# Patient Record
Sex: Female | Born: 1965
Health system: Southern US, Community
[De-identification: ages and names within clinical notes are randomized; demographics above are authoritative.]

## PROBLEM LIST (undated history)

## (undated) DIAGNOSIS — N63 Unspecified lump in unspecified breast: Secondary | ICD-10-CM

## (undated) DIAGNOSIS — I10 Essential (primary) hypertension: Secondary | ICD-10-CM

## (undated) HISTORY — PX: BREAST EXCISIONAL BIOPSY: SUR124

---

## 1998-05-21 ENCOUNTER — Encounter: Admission: RE | Admit: 1998-05-21 | Discharge: 1998-05-21 | Payer: Self-pay | Admitting: Family Medicine

## 1998-06-19 ENCOUNTER — Encounter: Admission: RE | Admit: 1998-06-19 | Discharge: 1998-06-19 | Payer: Self-pay | Admitting: Family Medicine

## 1998-09-02 ENCOUNTER — Encounter: Admission: RE | Admit: 1998-09-02 | Discharge: 1998-09-02 | Payer: Self-pay | Admitting: Family Medicine

## 1998-09-10 ENCOUNTER — Encounter: Admission: RE | Admit: 1998-09-10 | Discharge: 1998-09-10 | Payer: Self-pay | Admitting: Sports Medicine

## 1999-02-24 ENCOUNTER — Encounter: Admission: RE | Admit: 1999-02-24 | Discharge: 1999-02-24 | Payer: Self-pay | Admitting: Family Medicine

## 1999-03-31 ENCOUNTER — Encounter: Admission: RE | Admit: 1999-03-31 | Discharge: 1999-03-31 | Payer: Self-pay | Admitting: Family Medicine

## 1999-07-08 ENCOUNTER — Encounter: Admission: RE | Admit: 1999-07-08 | Discharge: 1999-07-08 | Payer: Self-pay | Admitting: Sports Medicine

## 1999-09-02 ENCOUNTER — Emergency Department (HOSPITAL_COMMUNITY): Admission: EM | Admit: 1999-09-02 | Discharge: 1999-09-02 | Payer: Self-pay | Admitting: Emergency Medicine

## 1999-09-02 ENCOUNTER — Encounter: Payer: Self-pay | Admitting: Emergency Medicine

## 1999-09-03 ENCOUNTER — Encounter: Admission: RE | Admit: 1999-09-03 | Discharge: 1999-09-03 | Payer: Self-pay | Admitting: Family Medicine

## 1999-11-12 ENCOUNTER — Encounter: Admission: RE | Admit: 1999-11-12 | Discharge: 1999-11-12 | Payer: Self-pay | Admitting: Family Medicine

## 1999-12-09 ENCOUNTER — Encounter: Admission: RE | Admit: 1999-12-09 | Discharge: 1999-12-09 | Payer: Self-pay | Admitting: Family Medicine

## 1999-12-23 ENCOUNTER — Encounter: Admission: RE | Admit: 1999-12-23 | Discharge: 1999-12-23 | Payer: Self-pay | Admitting: Sports Medicine

## 2000-01-22 ENCOUNTER — Encounter: Admission: RE | Admit: 2000-01-22 | Discharge: 2000-01-22 | Payer: Self-pay | Admitting: Family Medicine

## 2000-03-02 ENCOUNTER — Encounter: Admission: RE | Admit: 2000-03-02 | Discharge: 2000-03-02 | Payer: Self-pay | Admitting: Family Medicine

## 2000-03-02 ENCOUNTER — Other Ambulatory Visit: Admission: RE | Admit: 2000-03-02 | Discharge: 2000-03-02 | Payer: Self-pay | Admitting: Obstetrics & Gynecology

## 2000-03-15 ENCOUNTER — Encounter: Admission: RE | Admit: 2000-03-15 | Discharge: 2000-03-15 | Payer: Self-pay | Admitting: Family Medicine

## 2000-03-16 ENCOUNTER — Inpatient Hospital Stay (HOSPITAL_COMMUNITY): Admission: AD | Admit: 2000-03-16 | Discharge: 2000-03-16 | Payer: Self-pay | Admitting: Obstetrics

## 2000-03-30 ENCOUNTER — Encounter: Admission: RE | Admit: 2000-03-30 | Discharge: 2000-03-30 | Payer: Self-pay | Admitting: Sports Medicine

## 2000-04-02 ENCOUNTER — Encounter: Admission: RE | Admit: 2000-04-02 | Discharge: 2000-04-02 | Payer: Self-pay | Admitting: *Deleted

## 2000-04-19 ENCOUNTER — Encounter: Admission: RE | Admit: 2000-04-19 | Discharge: 2000-04-19 | Payer: Self-pay | Admitting: Family Medicine

## 2000-05-06 ENCOUNTER — Encounter (INDEPENDENT_AMBULATORY_CARE_PROVIDER_SITE_OTHER): Payer: Self-pay | Admitting: *Deleted

## 2000-05-06 ENCOUNTER — Ambulatory Visit (HOSPITAL_COMMUNITY): Admission: RE | Admit: 2000-05-06 | Discharge: 2000-05-06 | Payer: Self-pay | Admitting: Surgery

## 2000-06-15 ENCOUNTER — Encounter: Admission: RE | Admit: 2000-06-15 | Discharge: 2000-06-15 | Payer: Self-pay | Admitting: Family Medicine

## 2000-06-23 ENCOUNTER — Encounter: Admission: RE | Admit: 2000-06-23 | Discharge: 2000-06-23 | Payer: Self-pay | Admitting: Family Medicine

## 2000-11-17 ENCOUNTER — Encounter: Admission: RE | Admit: 2000-11-17 | Discharge: 2000-11-17 | Payer: Self-pay | Admitting: Family Medicine

## 2000-12-07 ENCOUNTER — Encounter: Admission: RE | Admit: 2000-12-07 | Discharge: 2000-12-07 | Payer: Self-pay | Admitting: Sports Medicine

## 2000-12-21 ENCOUNTER — Encounter: Admission: RE | Admit: 2000-12-21 | Discharge: 2000-12-21 | Payer: Self-pay | Admitting: Family Medicine

## 2001-07-06 ENCOUNTER — Encounter: Admission: RE | Admit: 2001-07-06 | Discharge: 2001-07-06 | Payer: Self-pay | Admitting: Family Medicine

## 2001-07-26 ENCOUNTER — Encounter: Admission: RE | Admit: 2001-07-26 | Discharge: 2001-07-26 | Payer: Self-pay | Admitting: Family Medicine

## 2001-08-08 ENCOUNTER — Encounter: Admission: RE | Admit: 2001-08-08 | Discharge: 2001-08-08 | Payer: Self-pay | Admitting: Family Medicine

## 2001-08-29 ENCOUNTER — Encounter: Admission: RE | Admit: 2001-08-29 | Discharge: 2001-08-29 | Payer: Self-pay | Admitting: Family Medicine

## 2001-09-09 ENCOUNTER — Encounter: Admission: RE | Admit: 2001-09-09 | Discharge: 2001-09-09 | Payer: Self-pay | Admitting: Family Medicine

## 2001-10-25 ENCOUNTER — Encounter: Admission: RE | Admit: 2001-10-25 | Discharge: 2001-10-25 | Payer: Self-pay | Admitting: Family Medicine

## 2001-10-31 ENCOUNTER — Other Ambulatory Visit: Admission: RE | Admit: 2001-10-31 | Discharge: 2001-10-31 | Payer: Self-pay | Admitting: Sports Medicine

## 2001-10-31 ENCOUNTER — Encounter: Admission: RE | Admit: 2001-10-31 | Discharge: 2001-10-31 | Payer: Self-pay | Admitting: Sports Medicine

## 2002-02-15 ENCOUNTER — Encounter: Admission: RE | Admit: 2002-02-15 | Discharge: 2002-02-15 | Payer: Self-pay | Admitting: Family Medicine

## 2002-03-08 ENCOUNTER — Encounter: Admission: RE | Admit: 2002-03-08 | Discharge: 2002-03-08 | Payer: Self-pay | Admitting: Family Medicine

## 2002-03-21 ENCOUNTER — Encounter: Admission: RE | Admit: 2002-03-21 | Discharge: 2002-03-21 | Payer: Self-pay | Admitting: Family Medicine

## 2002-03-21 ENCOUNTER — Ambulatory Visit (HOSPITAL_COMMUNITY): Admission: RE | Admit: 2002-03-21 | Discharge: 2002-03-21 | Payer: Self-pay | Admitting: Family Medicine

## 2002-03-24 ENCOUNTER — Encounter: Admission: RE | Admit: 2002-03-24 | Discharge: 2002-03-24 | Payer: Self-pay | Admitting: Family Medicine

## 2002-05-11 ENCOUNTER — Encounter: Admission: RE | Admit: 2002-05-11 | Discharge: 2002-05-11 | Payer: Self-pay | Admitting: Family Medicine

## 2002-08-11 ENCOUNTER — Encounter: Admission: RE | Admit: 2002-08-11 | Discharge: 2002-08-11 | Payer: Self-pay | Admitting: Family Medicine

## 2002-08-17 ENCOUNTER — Encounter: Payer: Self-pay | Admitting: Sports Medicine

## 2002-08-17 ENCOUNTER — Encounter: Admission: RE | Admit: 2002-08-17 | Discharge: 2002-08-17 | Payer: Self-pay | Admitting: Sports Medicine

## 2002-09-26 ENCOUNTER — Encounter: Admission: RE | Admit: 2002-09-26 | Discharge: 2002-09-26 | Payer: Self-pay | Admitting: Family Medicine

## 2002-09-28 ENCOUNTER — Encounter: Admission: RE | Admit: 2002-09-28 | Discharge: 2002-09-28 | Payer: Self-pay | Admitting: Family Medicine

## 2002-11-08 ENCOUNTER — Encounter: Admission: RE | Admit: 2002-11-08 | Discharge: 2002-11-08 | Payer: Self-pay | Admitting: Family Medicine

## 2002-12-06 ENCOUNTER — Encounter: Admission: RE | Admit: 2002-12-06 | Discharge: 2002-12-06 | Payer: Self-pay | Admitting: Family Medicine

## 2002-12-13 ENCOUNTER — Other Ambulatory Visit: Admission: RE | Admit: 2002-12-13 | Discharge: 2002-12-13 | Payer: Self-pay | Admitting: Family Medicine

## 2002-12-13 ENCOUNTER — Encounter: Admission: RE | Admit: 2002-12-13 | Discharge: 2002-12-13 | Payer: Self-pay | Admitting: Family Medicine

## 2003-01-12 ENCOUNTER — Encounter: Admission: RE | Admit: 2003-01-12 | Discharge: 2003-01-12 | Payer: Self-pay | Admitting: Family Medicine

## 2003-02-20 ENCOUNTER — Encounter: Payer: Self-pay | Admitting: Sports Medicine

## 2003-02-20 ENCOUNTER — Encounter: Admission: RE | Admit: 2003-02-20 | Discharge: 2003-02-20 | Payer: Self-pay | Admitting: Sports Medicine

## 2003-02-28 ENCOUNTER — Emergency Department (HOSPITAL_COMMUNITY): Admission: AD | Admit: 2003-02-28 | Discharge: 2003-02-28 | Payer: Self-pay | Admitting: Emergency Medicine

## 2003-02-28 ENCOUNTER — Encounter: Payer: Self-pay | Admitting: Emergency Medicine

## 2003-04-27 ENCOUNTER — Encounter: Admission: RE | Admit: 2003-04-27 | Discharge: 2003-04-27 | Payer: Self-pay | Admitting: Sports Medicine

## 2003-06-12 ENCOUNTER — Emergency Department (HOSPITAL_COMMUNITY): Admission: EM | Admit: 2003-06-12 | Discharge: 2003-06-13 | Payer: Self-pay | Admitting: Emergency Medicine

## 2004-02-01 ENCOUNTER — Encounter: Admission: RE | Admit: 2004-02-01 | Discharge: 2004-02-01 | Payer: Self-pay | Admitting: Sports Medicine

## 2004-02-03 ENCOUNTER — Emergency Department (HOSPITAL_COMMUNITY): Admission: EM | Admit: 2004-02-03 | Discharge: 2004-02-03 | Payer: Self-pay | Admitting: Emergency Medicine

## 2004-06-25 ENCOUNTER — Ambulatory Visit: Payer: Self-pay | Admitting: Family Medicine

## 2004-12-19 ENCOUNTER — Other Ambulatory Visit: Admission: RE | Admit: 2004-12-19 | Discharge: 2004-12-19 | Payer: Self-pay | Admitting: Family Medicine

## 2004-12-19 ENCOUNTER — Ambulatory Visit: Payer: Self-pay | Admitting: Family Medicine

## 2005-02-14 ENCOUNTER — Emergency Department (HOSPITAL_COMMUNITY): Admission: EM | Admit: 2005-02-14 | Discharge: 2005-02-14 | Payer: Self-pay | Admitting: Emergency Medicine

## 2005-04-30 ENCOUNTER — Ambulatory Visit: Payer: Self-pay | Admitting: Family Medicine

## 2005-05-29 ENCOUNTER — Ambulatory Visit: Payer: Self-pay | Admitting: Family Medicine

## 2005-09-30 ENCOUNTER — Ambulatory Visit: Payer: Self-pay | Admitting: Family Medicine

## 2005-12-17 ENCOUNTER — Encounter (INDEPENDENT_AMBULATORY_CARE_PROVIDER_SITE_OTHER): Payer: Self-pay | Admitting: *Deleted

## 2005-12-17 LAB — CONVERTED CEMR LAB

## 2006-01-14 ENCOUNTER — Other Ambulatory Visit: Admission: RE | Admit: 2006-01-14 | Discharge: 2006-01-14 | Payer: Self-pay | Admitting: Family Medicine

## 2006-01-14 ENCOUNTER — Ambulatory Visit: Payer: Self-pay | Admitting: Family Medicine

## 2006-01-15 ENCOUNTER — Encounter: Admission: RE | Admit: 2006-01-15 | Discharge: 2006-01-15 | Payer: Self-pay | Admitting: Sports Medicine

## 2006-03-19 ENCOUNTER — Ambulatory Visit: Payer: Self-pay | Admitting: Sports Medicine

## 2006-10-04 ENCOUNTER — Ambulatory Visit: Payer: Self-pay | Admitting: Family Medicine

## 2006-12-06 ENCOUNTER — Ambulatory Visit: Payer: Self-pay | Admitting: Family Medicine

## 2006-12-06 ENCOUNTER — Encounter: Payer: Self-pay | Admitting: Family Medicine

## 2006-12-06 LAB — CONVERTED CEMR LAB
Chlamydia, Swab/Urine, PCR: NEGATIVE
GC Probe Amp, Urine: NEGATIVE

## 2006-12-09 ENCOUNTER — Encounter: Payer: Self-pay | Admitting: Family Medicine

## 2006-12-09 ENCOUNTER — Ambulatory Visit: Payer: Self-pay | Admitting: Family Medicine

## 2006-12-16 DIAGNOSIS — F1010 Alcohol abuse, uncomplicated: Secondary | ICD-10-CM | POA: Insufficient documentation

## 2006-12-16 DIAGNOSIS — F329 Major depressive disorder, single episode, unspecified: Secondary | ICD-10-CM | POA: Insufficient documentation

## 2006-12-17 ENCOUNTER — Encounter (INDEPENDENT_AMBULATORY_CARE_PROVIDER_SITE_OTHER): Payer: Self-pay | Admitting: *Deleted

## 2006-12-27 ENCOUNTER — Telehealth (INDEPENDENT_AMBULATORY_CARE_PROVIDER_SITE_OTHER): Payer: Self-pay | Admitting: *Deleted

## 2006-12-29 ENCOUNTER — Encounter: Payer: Self-pay | Admitting: Family Medicine

## 2006-12-29 ENCOUNTER — Ambulatory Visit: Payer: Self-pay | Admitting: Sports Medicine

## 2006-12-29 ENCOUNTER — Encounter: Admission: RE | Admit: 2006-12-29 | Discharge: 2006-12-29 | Payer: Self-pay | Admitting: Sports Medicine

## 2007-03-15 ENCOUNTER — Ambulatory Visit: Payer: Self-pay | Admitting: Sports Medicine

## 2007-03-15 ENCOUNTER — Telehealth: Payer: Self-pay | Admitting: *Deleted

## 2007-03-15 LAB — CONVERTED CEMR LAB
Glucose, Urine, Semiquant: NEGATIVE
Protein, U semiquant: 30
Specific Gravity, Urine: 1.02
pH: 7

## 2007-03-31 ENCOUNTER — Encounter: Payer: Self-pay | Admitting: Family Medicine

## 2007-03-31 ENCOUNTER — Ambulatory Visit: Payer: Self-pay | Admitting: Sports Medicine

## 2007-03-31 LAB — CONVERTED CEMR LAB
HDL: 61 mg/dL (ref >39)
LDL Cholesterol: 137 mg/dL — ABNORMAL HIGH (ref 0–99)
Triglycerides: 92 mg/dL (ref <150)

## 2007-04-07 ENCOUNTER — Telehealth: Payer: Self-pay | Admitting: *Deleted

## 2007-05-02 ENCOUNTER — Inpatient Hospital Stay (HOSPITAL_COMMUNITY): Admission: AD | Admit: 2007-05-02 | Discharge: 2007-05-04 | Payer: Self-pay | Admitting: Family Medicine

## 2007-05-02 ENCOUNTER — Ambulatory Visit: Payer: Self-pay | Admitting: Family Medicine

## 2007-05-11 ENCOUNTER — Encounter: Payer: Self-pay | Admitting: Family Medicine

## 2007-07-18 ENCOUNTER — Ambulatory Visit: Payer: Self-pay | Admitting: Sports Medicine

## 2007-07-18 ENCOUNTER — Telehealth (INDEPENDENT_AMBULATORY_CARE_PROVIDER_SITE_OTHER): Payer: Self-pay | Admitting: Family Medicine

## 2007-07-18 ENCOUNTER — Telehealth (INDEPENDENT_AMBULATORY_CARE_PROVIDER_SITE_OTHER): Payer: Self-pay | Admitting: *Deleted

## 2007-07-18 LAB — CONVERTED CEMR LAB
Nitrite: NEGATIVE
Specific Gravity, Urine: 1.015
Urobilinogen, UA: 2

## 2007-08-26 ENCOUNTER — Ambulatory Visit: Payer: Self-pay | Admitting: Family Medicine

## 2007-09-12 ENCOUNTER — Encounter (INDEPENDENT_AMBULATORY_CARE_PROVIDER_SITE_OTHER): Payer: Self-pay | Admitting: Family Medicine

## 2007-09-12 ENCOUNTER — Ambulatory Visit: Payer: Self-pay | Admitting: Sports Medicine

## 2007-09-12 LAB — CONVERTED CEMR LAB
ALT: 9 units/L (ref 0–35)
AST: 14 units/L (ref 0–37)
BUN: 14 mg/dL (ref 6–23)
Beta hcg, urine, semiquantitative: NEGATIVE
Bilirubin Urine: NEGATIVE
Creatinine, Ser: 0.7 mg/dL (ref 0.40–1.20)
Glucose, Urine, Semiquant: NEGATIVE
HCT: 40 % (ref 36.0–46.0)
Hemoglobin: 12.9 g/dL (ref 12.0–15.0)
Ketones, urine, test strip: NEGATIVE
MCHC: 32.3 g/dL (ref 30.0–36.0)
MCV: 89.1 fL (ref 78.0–100.0)
Platelets: 299 10*3/uL (ref 150–400)
RDW: 12.5 % (ref 11.5–15.5)
Specific Gravity, Urine: 1.02
Total Bilirubin: 1.4 mg/dL — ABNORMAL HIGH (ref 0.3–1.2)
Urobilinogen, UA: 0.2
pH: 7

## 2007-09-13 ENCOUNTER — Encounter (INDEPENDENT_AMBULATORY_CARE_PROVIDER_SITE_OTHER): Payer: Self-pay | Admitting: Family Medicine

## 2007-10-17 ENCOUNTER — Encounter: Payer: Self-pay | Admitting: *Deleted

## 2008-01-06 ENCOUNTER — Ambulatory Visit: Payer: Self-pay | Admitting: Family Medicine

## 2008-01-06 ENCOUNTER — Encounter: Payer: Self-pay | Admitting: Family Medicine

## 2008-01-06 LAB — CONVERTED CEMR LAB
CO2: 25 meq/L (ref 19–32)
Calcium: 8.7 mg/dL (ref 8.4–10.5)
Creatinine, Ser: 0.68 mg/dL (ref 0.40–1.20)
Eosinophils Absolute: 0.6 10*3/uL (ref 0.0–0.7)
Eosinophils Relative: 11 % — ABNORMAL HIGH (ref 0–5)
Glucose, Bld: 90 mg/dL (ref 70–99)
HCT: 40.3 % (ref 36.0–46.0)
Hemoglobin: 12.8 g/dL (ref 12.0–15.0)
Lymphocytes Relative: 46 % (ref 12–46)
Lymphs Abs: 2.4 10*3/uL (ref 0.7–4.0)
MCV: 90.8 fL (ref 78.0–100.0)
Monocytes Absolute: 0.3 10*3/uL (ref 0.1–1.0)
Platelets: 273 10*3/uL (ref 150–400)
RDW: 12.9 % (ref 11.5–15.5)
Sodium: 139 meq/L (ref 135–145)
WBC: 5.1 10*3/uL (ref 4.0–10.5)

## 2008-04-09 ENCOUNTER — Ambulatory Visit: Payer: Self-pay | Admitting: Sports Medicine

## 2008-04-09 ENCOUNTER — Encounter (INDEPENDENT_AMBULATORY_CARE_PROVIDER_SITE_OTHER): Payer: Self-pay | Admitting: *Deleted

## 2008-04-17 ENCOUNTER — Telehealth (INDEPENDENT_AMBULATORY_CARE_PROVIDER_SITE_OTHER): Payer: Self-pay | Admitting: *Deleted

## 2008-05-07 ENCOUNTER — Ambulatory Visit: Payer: Self-pay | Admitting: Family Medicine

## 2008-05-07 DIAGNOSIS — K219 Gastro-esophageal reflux disease without esophagitis: Secondary | ICD-10-CM | POA: Insufficient documentation

## 2008-05-24 ENCOUNTER — Ambulatory Visit: Payer: Self-pay | Admitting: Family Medicine

## 2008-05-24 ENCOUNTER — Other Ambulatory Visit: Admission: RE | Admit: 2008-05-24 | Discharge: 2008-05-24 | Payer: Self-pay | Admitting: Family Medicine

## 2008-05-24 ENCOUNTER — Encounter: Payer: Self-pay | Admitting: Family Medicine

## 2008-05-24 LAB — CONVERTED CEMR LAB: Pap Smear: NORMAL

## 2008-05-28 LAB — CONVERTED CEMR LAB
AST: 13 units/L (ref 0–37)
Alkaline Phosphatase: 39 units/L (ref 39–117)
BUN: 12 mg/dL (ref 6–23)
Chlamydia, DNA Probe: NEGATIVE
Creatinine, Ser: 0.82 mg/dL (ref 0.40–1.20)
Glucose, Bld: 83 mg/dL (ref 70–99)
HDL: 54 mg/dL (ref >39)
LDL Cholesterol: 165 mg/dL — ABNORMAL HIGH (ref 0–99)
Potassium: 3.9 meq/L (ref 3.5–5.3)
Total Bilirubin: 1.1 mg/dL (ref 0.3–1.2)
Total CHOL/HDL Ratio: 4.4
Triglycerides: 99 mg/dL (ref <150)
VLDL: 20 mg/dL (ref 0–40)

## 2008-05-29 ENCOUNTER — Telehealth: Payer: Self-pay | Admitting: Family Medicine

## 2008-06-01 ENCOUNTER — Telehealth: Payer: Self-pay | Admitting: *Deleted

## 2008-06-22 ENCOUNTER — Encounter: Payer: Self-pay | Admitting: *Deleted

## 2008-09-12 ENCOUNTER — Ambulatory Visit: Payer: Self-pay | Admitting: Family Medicine

## 2008-09-12 LAB — CONVERTED CEMR LAB: Whiff Test: POSITIVE

## 2008-10-02 LAB — CONVERTED CEMR LAB: GC Probe Amp, Genital: NEGATIVE

## 2008-10-03 ENCOUNTER — Telehealth: Payer: Self-pay | Admitting: *Deleted

## 2008-10-04 ENCOUNTER — Ambulatory Visit: Payer: Self-pay | Admitting: Family Medicine

## 2008-11-01 ENCOUNTER — Telehealth: Payer: Self-pay | Admitting: Family Medicine

## 2008-11-01 ENCOUNTER — Ambulatory Visit: Payer: Self-pay | Admitting: Family Medicine

## 2008-11-01 LAB — CONVERTED CEMR LAB
Bilirubin Urine: NEGATIVE
Ketones, urine, test strip: NEGATIVE
Protein, U semiquant: 100
Specific Gravity, Urine: 1.025
Whiff Test: POSITIVE
pH: 6.5

## 2008-11-02 ENCOUNTER — Telehealth: Payer: Self-pay | Admitting: Family Medicine

## 2008-11-13 ENCOUNTER — Telehealth (INDEPENDENT_AMBULATORY_CARE_PROVIDER_SITE_OTHER): Payer: Self-pay | Admitting: *Deleted

## 2008-11-27 ENCOUNTER — Ambulatory Visit: Payer: Self-pay | Admitting: Family Medicine

## 2008-11-27 LAB — CONVERTED CEMR LAB
Bilirubin Urine: NEGATIVE
Glucose, Urine, Semiquant: NEGATIVE
Nitrite: NEGATIVE
RBC / HPF: NEGATIVE
Specific Gravity, Urine: 1.025
Urobilinogen, UA: 0.2

## 2009-02-07 ENCOUNTER — Telehealth: Payer: Self-pay | Admitting: Family Medicine

## 2009-02-13 ENCOUNTER — Telehealth: Payer: Self-pay | Admitting: *Deleted

## 2009-02-14 ENCOUNTER — Ambulatory Visit: Payer: Self-pay | Admitting: Family Medicine

## 2009-02-14 ENCOUNTER — Encounter (INDEPENDENT_AMBULATORY_CARE_PROVIDER_SITE_OTHER): Payer: Self-pay | Admitting: Family Medicine

## 2009-02-14 LAB — CONVERTED CEMR LAB: Whiff Test: POSITIVE

## 2009-02-15 LAB — CONVERTED CEMR LAB: Chlamydia, DNA Probe: NEGATIVE

## 2009-03-05 ENCOUNTER — Telehealth: Payer: Self-pay | Admitting: Family Medicine

## 2009-03-12 ENCOUNTER — Ambulatory Visit: Payer: Self-pay | Admitting: Family Medicine

## 2009-03-14 ENCOUNTER — Ambulatory Visit: Payer: Self-pay | Admitting: Family Medicine

## 2009-03-25 ENCOUNTER — Telehealth: Payer: Self-pay | Admitting: Family Medicine

## 2009-03-25 ENCOUNTER — Ambulatory Visit: Payer: Self-pay | Admitting: Family Medicine

## 2009-05-21 ENCOUNTER — Ambulatory Visit: Payer: Self-pay | Admitting: Family Medicine

## 2009-05-21 DIAGNOSIS — I10 Essential (primary) hypertension: Secondary | ICD-10-CM | POA: Insufficient documentation

## 2009-05-21 DIAGNOSIS — E7800 Pure hypercholesterolemia, unspecified: Secondary | ICD-10-CM | POA: Insufficient documentation

## 2009-05-28 ENCOUNTER — Encounter: Payer: Self-pay | Admitting: Family Medicine

## 2009-05-28 ENCOUNTER — Ambulatory Visit: Payer: Self-pay | Admitting: Family Medicine

## 2009-05-28 ENCOUNTER — Other Ambulatory Visit: Admission: RE | Admit: 2009-05-28 | Discharge: 2009-05-28 | Payer: Self-pay | Admitting: Obstetrics and Gynecology

## 2009-05-28 LAB — CONVERTED CEMR LAB
Albumin: 4 g/dL (ref 3.5–5.2)
BUN: 12 mg/dL (ref 6–23)
CO2: 21 meq/L (ref 19–32)
Calcium: 8.4 mg/dL (ref 8.4–10.5)
Chloride: 107 meq/L (ref 96–112)
Cholesterol: 213 mg/dL — ABNORMAL HIGH (ref 0–200)
Glucose, Bld: 88 mg/dL (ref 70–99)
HDL: 53 mg/dL (ref >39)
Potassium: 4.1 meq/L (ref 3.5–5.3)
Protein, U semiquant: NEGATIVE
Triglycerides: 85 mg/dL (ref <150)
Urobilinogen, UA: 1
WBC Urine, dipstick: NEGATIVE

## 2009-05-29 ENCOUNTER — Encounter: Payer: Self-pay | Admitting: Family Medicine

## 2009-06-07 ENCOUNTER — Telehealth (INDEPENDENT_AMBULATORY_CARE_PROVIDER_SITE_OTHER): Payer: Self-pay | Admitting: *Deleted

## 2009-06-12 ENCOUNTER — Encounter (INDEPENDENT_AMBULATORY_CARE_PROVIDER_SITE_OTHER): Payer: Self-pay | Admitting: *Deleted

## 2009-07-17 ENCOUNTER — Encounter: Payer: Self-pay | Admitting: Family Medicine

## 2009-07-17 ENCOUNTER — Ambulatory Visit: Payer: Self-pay | Admitting: Family Medicine

## 2009-07-17 LAB — CONVERTED CEMR LAB: Chlamydia, DNA Probe: NEGATIVE

## 2009-07-18 ENCOUNTER — Encounter: Payer: Self-pay | Admitting: Family Medicine

## 2009-08-30 ENCOUNTER — Ambulatory Visit: Payer: Self-pay | Admitting: Family Medicine

## 2009-08-30 LAB — CONVERTED CEMR LAB
Blood in Urine, dipstick: NEGATIVE
Specific Gravity, Urine: 1.025
Urobilinogen, UA: 1
pH: 7

## 2009-08-31 ENCOUNTER — Encounter: Payer: Self-pay | Admitting: Family Medicine

## 2009-09-19 ENCOUNTER — Telehealth: Payer: Self-pay | Admitting: Family Medicine

## 2009-10-20 ENCOUNTER — Emergency Department (HOSPITAL_COMMUNITY): Admission: EM | Admit: 2009-10-20 | Discharge: 2009-10-21 | Payer: Self-pay | Admitting: Emergency Medicine

## 2009-10-28 ENCOUNTER — Telehealth: Payer: Self-pay | Admitting: Family Medicine

## 2010-01-16 ENCOUNTER — Encounter: Payer: Self-pay | Admitting: Family Medicine

## 2010-01-17 ENCOUNTER — Ambulatory Visit: Payer: Self-pay | Admitting: Family Medicine

## 2010-03-18 ENCOUNTER — Ambulatory Visit: Payer: Self-pay | Admitting: Family Medicine

## 2010-03-18 ENCOUNTER — Telehealth: Payer: Self-pay | Admitting: Family Medicine

## 2010-05-19 ENCOUNTER — Ambulatory Visit: Payer: Self-pay | Admitting: Family Medicine

## 2010-05-19 ENCOUNTER — Encounter: Payer: Self-pay | Admitting: Family Medicine

## 2010-05-19 LAB — CONVERTED CEMR LAB
Chlamydia, DNA Probe: NEGATIVE
Ketones, urine, test strip: NEGATIVE
Nitrite: NEGATIVE
Urobilinogen, UA: 1
Whiff Test: POSITIVE

## 2010-07-25 ENCOUNTER — Encounter: Payer: Self-pay | Admitting: Family Medicine

## 2010-08-01 ENCOUNTER — Emergency Department (HOSPITAL_COMMUNITY)
Admission: EM | Admit: 2010-08-01 | Discharge: 2010-08-01 | Payer: Self-pay | Source: Home / Self Care | Admitting: Emergency Medicine

## 2010-08-21 ENCOUNTER — Encounter: Payer: Self-pay | Admitting: Family Medicine

## 2010-08-22 ENCOUNTER — Encounter: Payer: Self-pay | Admitting: Family Medicine

## 2010-08-22 ENCOUNTER — Ambulatory Visit: Payer: Self-pay | Admitting: Family Medicine

## 2010-09-22 ENCOUNTER — Encounter: Payer: Self-pay | Admitting: Family Medicine

## 2010-09-22 ENCOUNTER — Ambulatory Visit: Payer: Self-pay | Admitting: Family Medicine

## 2010-09-22 ENCOUNTER — Encounter: Payer: Self-pay | Admitting: *Deleted

## 2010-09-22 DIAGNOSIS — K5900 Constipation, unspecified: Secondary | ICD-10-CM | POA: Insufficient documentation

## 2010-09-22 DIAGNOSIS — B9689 Other specified bacterial agents as the cause of diseases classified elsewhere: Secondary | ICD-10-CM

## 2010-09-22 DIAGNOSIS — N760 Acute vaginitis: Secondary | ICD-10-CM | POA: Insufficient documentation

## 2010-09-22 LAB — CONVERTED CEMR LAB
Chlamydia, DNA Probe: NEGATIVE
GC Probe Amp, Genital: NEGATIVE
Whiff Test: POSITIVE

## 2010-11-09 ENCOUNTER — Encounter: Payer: Self-pay | Admitting: Family Medicine

## 2010-11-14 ENCOUNTER — Ambulatory Visit: Admit: 2010-11-14 | Payer: Self-pay

## 2010-11-20 NOTE — Progress Notes (Signed)
Summary: meds  Phone Note Call from Patient Call back at Home Phone [phone]   Caller: Patient Summary of Call: needs something called in for acid reflux - [facility] Initial call taken by: De Nurse,  October 28, 2009 2:38 PM  Follow-up for Phone Call        she has changed pharmacies. sent refill.  to pcp to drop [facility] from the [facility]. they may not fill it as it says [facility] Follow-up by: Golden Circle RN,  October 28, 2009 2:39 PM    New/Updated Medications: OMEPRAZOLE 20 MG CPDR (OMEPRAZOLE) 1 tab by mouth daily Prescriptions: OMEPRAZOLE 20 MG CPDR (OMEPRAZOLE) 1 tab by mouth daily  #30 x 11   Entered and Authorized by:   Ardeen Garland  MD   Signed by:   Ardeen Garland  MD on 10/28/2009   Method used:   Electronically to        [facility] [address] [phone]* (retail)       [address]       [address]       [place], [place]  [address]       Ph: [phone]       Fax: [phone]   RxID:   [id number] [facility] OME[facility] 20 MG TBEC (OMEPRAZOLE) 1 tab by mouth daily  #30 x 5   Entered by:   Golden Circle RN   Authorized by:   Ardeen Garland  MD   Signed by:   Golden Circle RN on 10/28/2009   Method used:   Electronically to        [facility] [address] [phone]* (retail)       [address]       [address]       [place], [place]  [address]       Ph: [phone]       Fax: [phone]   RxID:   [id number]
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
## 2010-11-20 NOTE — Assessment & Plan Note (Signed)
Summary: std?,df   Vital Signs:  Patient profile:   45 year old female Height:      62.75 inches Weight:      143 pounds BMI:     25.63 Temp:     98.3 degrees F oral Pulse rate:   74 / minute BP sitting:   126 / 74  (right arm) Cuff size:   regular  Vitals Entered By: Jimmy Footman, CMA (May 19, 2010 3:34 PM) CC: concerned about std. no sx... condom fell off during intercourse Is Patient Diabetic? No Pain Assessment Patient in pain? no        Primary Provider:  Demetria Pore MD  CC:  concerned about std. no sx... condom fell off during intercourse.  History of Present Illness: Pt comes in with a chief complaint of wanting to be checked for STIs, "just in case." Pt reports having intercourse with a regular partner 4-7 days ago when the condom "fell off."  The pt does not believe that her partner has any STIs but just wants to be sure. She says that she has not had any vaginal discharge, bleeding, or foul odor. She does not currently have dysuria, however, she did have dysuria a few  days ago and took 2 "old pills" and it has since resolved. She does not know what these pills were. No feelings of incomplete emptying.  The pt is also interested in starting some form of birth control. Initially she wanted depo, but decided against it once being counseled on the possibility of weight gain; pt decided she would prefer OCPs. Discussed BTL, pt denied. Pt has previously had implanon and does not want that. Pt will consider IUD and will call for future appt regarding this.   Current Medications (verified): 1)  E-Z Spacer  Devi (Spacer/aero-Holding Chambers) .... Inhale 1 Puff As Directed Every Four Hours 2)  Proventil Hfa 108 (90 Base) Mcg/act Aers (Albuterol Sulfate) .... Inhale 2 Puff Using Inhaler Every Four Hours 3)  Omeprazole 20 Mg Cpdr (Omeprazole) .Marland Kitchen.. 1 Tab By Mouth Daily 4)  Hydrochlorothiazide 12.5 Mg Caps (Hydrochlorothiazide) .Marland Kitchen.. 1 Tab By Mouth Daily 5)  Loratadine 10 Mg  Tabs (Loratadine) .Marland Kitchen.. 1 Tab By Mouth Daily For Allergies 6)  Fluconazole 150 Mg Tabs (Fluconazole) .... Take 1 Pill By Mouth One Time  Allergies (verified): 1)  ! Codeine 2)  Sulfamethoxazole (Sulfamethoxazole) 3)  Metronidazole (Metronidazole)  Physical Exam  General:  Well-developed,well-nourished,in no acute distress; alert,appropriate and cooperative throughout examination Lungs:  Normal respiratory effort, chest expands symmetrically. Lungs are clear to auscultation, no crackles or wheezes. Heart:  Normal rate and regular rhythm. S1 and S2 normal without gallop, murmur, click, rub or other extra sounds. Abdomen:  soft, normal bowel sounds, no distention, and no masses.  diffuse, moderate, TTP LLQ, RLQ, suprapubic areas. Genitalia:  no external lesions, no vaginal discharge, mucosa pink and moist, no vaginal or cervical lesions, no friaility or hemorrhage, and normal uterus size and position.   speculum exam- no discharge or bleeding noted. cervix normal in appearance.   Impression & Recommendations:  Problem # 1:  VAGINITIS, CANDIDAL (ICD-112.1)  Mod amt of yeast seen on wet prep. Will treat empirically.  Her updated medication list for this problem includes:    Fluconazole 150 Mg Tabs (Fluconazole) .Marland Kitchen... Take 1 pill by mouth one time  Orders: [facility]- Est Level  3 ([address]109)  Problem # 2:  ABDOMINAL PAIN, SUPRAPUBIC (ICD-789.09)  UA negative for UTI. GC/Chlamydia PCR sent. Will  follow up with pt if one is positive: cell # [id number]. [facility]acy[facility] [address]. Upreg negative. Wet prep + for yeast.  Orders: [facility]- Est Level  3 (54098)  Problem # 3:  CONTRACEPTIVE MANAGEMENT (ICD-V25.09) Discussed depo vs OCPs vs implanon vs IUD vs BTL with pt. Does not want depo 2/2 weight gain. Does not want implanon, has tried before. Does not desire BTL. Would like OCPs, but risks/benefits discussed (pt is a non-smoker, but over 35, untreated HTN, untreated HLD)- progestin only mini-pill  would be most appropriate. Pt given hand out on IUD. Pt will make appt with me to discuss birth control options further.  Orders: U Preg-[facility] (81025) Urinalysis-[facility] (00000) [facility]- Est Level  3 (11914)  Complete Medication List: 1)  E-z Spacer Devi (Spacer/aero-holding chambers) .... Inhale 1 puff as directed every four hours 2)  Proventil Hfa 108 (90 Base) Mcg/act Aers (Albuterol sulfate) .... Inhale 2 puff using inhaler every four hours 3)  Omeprazole 20 Mg Cpdr (Omeprazole) .Marland Kitchen.. 1 tab by mouth daily 4)  Hydrochlorothiazide 12.5 Mg Caps (Hydrochlorothiazide) .Marland Kitchen.. 1 tab by mouth daily 5)  Loratadine 10 Mg Tabs (Loratadine) .Marland Kitchen.. 1 tab by mouth daily for allergies 6)  Fluconazole 150 Mg Tabs (Fluconazole) .... Take 1 pill by mouth one time  Other Orders: GC/Chlamydia[facility] (87591/87491) [facility]  Patient Instructions: 1)  It looks like you have a yeast infection. Please pick up your prescription for diflucan from [facility] on [address] in order to clear this infection. 2)  You do not have a urinary tract infection and you are not pregnant!  3)  We are running some lab tests to make sure that you do not have gonorrhea or chlamydia, two common STIs. 4)  Please make an appointment with me to discuss birth control options. If you would like, you can read more about the Mirena IUD before coming back. 5)  You also need to have your cholesterol and some other labs checked, so, again, please make an appointment to see me. 6)  It was great meeting you! Prescriptions: FLUCONAZOLE 150 MG TABS (FLUCONAZOLE) Take 1 pill by mouth one time  #1 x 0   Entered and Authorized by:   Demetria Pore MD   Signed by:   Demetria Pore MD on 05/19/2010   Method used:   Electronically to        [facility] [address] [phone]* (retail)       [address]       [address]       [place], [place]  [address]       Ph: [phone]       Fax: [phone]   RxID:   [id number]    Prevention &  Chronic Care Immunizations   Influenza vaccine: Not documented    Tetanus booster: Not documented    Pneumococcal vaccine: Not documented  Other Screening   Pap smear: NEGATIVE FOR INTRAEPITHELIAL LESIONS OR MALIGNANCY.  (05/28/2009)   Pap smear due: 05/2009    Mammogram: Done.  (08/19/2002)   Mammogram due: 08/20/2003   Smoking status: never  (03/18/2010)  Lipids   Total Cholesterol: 213  (05/28/2009)   LDL: 143  (05/28/2009)   LDL Direct: Not documented   HDL: 53  (05/28/2009)   Triglycerides: 85  (05/28/2009)    SGOT (AST): 14  (05/28/2009)   SGPT (ALT): 11  (05/28/2009)   Alkaline phosphatase: 42  (05/28/2009)   Total bilirubin: 0.7  (05/28/2009)  Hypertension  Last Blood Pressure: 126 / 74  (05/19/2010)   Serum creatinine: 0.78  (05/28/2009)   Serum potassium 4.1  (05/28/2009)  Self-Management Support :    Hypertension self-management support: Not documented    Lipid self-management support: Not documented   Laboratory Results   Urine Tests  Date/Time Received: May 19, 2010 4:29 PM  Date/Time Reported: May 19, 2010 4:57 PM   Routine Urinalysis   Color: yellow Appearance: Clear Glucose: negative   (Normal Range: Negative) Bilirubin: negative   (Normal Range: Negative) Ketone: negative   (Normal Range: Negative) Spec. Gravity: 1.020   (Normal Range: 1.003-1.035) Blood: negative   (Normal Range: Negative) pH: 7.5   (Normal Range: 5.0-8.0) Protein: trace   (Normal Range: Negative) Urobilinogen: 1.0   (Normal Range: 0-1) Nitrite: negative   (Normal Range: Negative) Leukocyte Esterace: negative   (Normal Range: Negative)    Urine HCG: negative Comments: ...............test performed by......Marland KitchenBonnie A. [place], MLS (ASCP)cm  Date/Time Received: May 19, 2010 4:27 PM  Date/Time Reported: May 19, 2010 4:58 PM   Wet [place] Source: vag WBC/hpf: 1-5 Bacteria/hpf: 2+ rods and cocci Clue cells/hpf: few  Positive whiff Yeast/hpf:  moderate Trichomonas/hpf: none Comments: rare sperm ...............test performed by......Marland KitchenBonnie A. [place], MLS (ASCP)cm
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
## 2010-11-20 NOTE — Miscellaneous (Signed)
Summary: ears "plugged up"  Clinical Lists Changes pt states she cannot hear & thinks her ears are plugged up. appt tomorrow at 8:30am..[facility] RN  January 16, 2010 4:21 PM

## 2010-11-20 NOTE — Progress Notes (Signed)
Summary: Rx Prob  Phone Note Call from Patient Call back at Home Phone [phone]   Caller: Patient Summary of Call: Pt states that the rx that was sent in today is just too expensive and needs something that does not cost 200.00. [facility]. Initial call taken by: Clydell Hakim,  Mar 18, 2010 3:28 PM  Follow-up for Phone Call        Called pt back.  No fungal infection.  Don't have to take Griseofulvin.  Advised pt to take doxy with food.  If not better in 2 wks may need steroid cream for eczema.  Pt understand.  Follow-up by: Angeline Slim MD,  Mar 18, 2010 5:19 PM

## 2010-11-20 NOTE — Assessment & Plan Note (Signed)
Summary: ears"plugged"/[place]/mayans   Vital Signs:  Patient profile:   45 year old female Height:      62.75 inches Weight:      145 pounds BMI:     25.98 O2 Sat:      98 % on Room air Temp:     97.6 degrees F oral Pulse rate:   56 / minute BP sitting:   172 / 100  (right arm) Cuff size:   regular  Vitals Entered By: Tessie Fass CMA (January 17, 2010 8:39 AM)  O2 Flow:  Room air CC: difficulty hearing "ears feel stopped up" Is Patient Diabetic? No Pain Assessment Patient in pain? no        Primary Care Provider:  Ardeen Garland  MD  CC:  difficulty hearing "ears feel stopped up".  History of Present Illness: 45 yo female with 2 week history of bilateral ear fullness (without otalgia), nasal congestion, chest congestion, cough, wheezing, dyspnea, mild frontal pain.  Denies sore throat, fever, maxillary pain, nausea, vomiting, diarrhea, constipation, chest pain, abdominal pain.  Ran out of her Albuterol.  Is using Flonase, Robitussin, Nyquil, Mucinex DM.  HTN.  Uncontrolled today.  Has not taken BP meds in 2 weeks.  PMH significant for Asthma.  Habits & Providers  Alcohol-Tobacco-Diet     Tobacco Status: never  Current Medications (verified): 1)  E-Z Spacer  Devi (Spacer/aero-Holding Chambers) .... Inhale 1 Puff As Directed Every Four Hours 2)  Proventil Hfa 108 (90 Base) Mcg/act Aers (Albuterol Sulfate) .... Inhale 2 Puff Using Inhaler Every Four Hours 3)  Simvastatin 20 Mg Tabs (Simvastatin) .Marland Kitchen.. 1 By Mouth At Bedtime 4)  Omeprazole 20 Mg Cpdr (Omeprazole) .Marland Kitchen.. 1 Tab By Mouth Daily 5)  Hydrochlorothiazide 12.5 Mg Caps (Hydrochlorothiazide) .Marland Kitchen.. 1 Tab By Mouth Daily 6)  Loratadine 10 Mg Tabs (Loratadine) .Marland Kitchen.. 1 Tab By Mouth Daily For Allergies  Allergies (verified): 1)  ! Codeine 2)  Sulfamethoxazole (Sulfamethoxazole) 3)  Metronidazole (Metronidazole)  Physical Exam  Additional Exam:  VITALS:  Reviewed, hypertensive, afebrile, normal SpO2 GEN: Alert & oriented,  no acute distress NECK: Midline trachea, no masses/thyromegaly, no cervical lymphadenopathy CARDIO: Regular rate and rhythm, no murmurs/rubs/gallops, 2+ bilateral radial pulses RESP: Occasional expiratory wheeze, normal work of breathing, no retractions/accessory muscle use EXT: Nontender, no edema EYES:  No corneal or conjunctival inflammation noted. EOMI. PERRLA.  Vision grossly normal. EARS:  External ear without significant lesions or deformities.  Clear canals, TM intact bilaterally with fluid but without bulging, retraction, inflammation or discharge. Hearing grossly normal bilaterally. NOSE:  Nasal mucosa are pink and moist without lesions or exudates. MOUTH:  Oral mucosa and oropharynx without lesions or exudates.    Impression & Recommendations:  Problem # 1:  VIRAL URI (ICD-465.9)  Main complaint is of ear fullness and nasal congestion.  No signs of AOM.  Mild wheeze but no respiratory distress.  No fever or hypoxia.  Woudl avoid Pseudoephedrine given uncontrolled HTN.  Supportive care per patient instructions.  Orders: [facility]- Est  Level 4 (54098)  Problem # 2:  ESSENTIAL HYPERTENSION, BENIGN (ICD-401.1) Assessment: Deteriorated  Uncontrolled--not taking medicine.  Advised to restart. Her updated medication list for this problem includes:    Hydrochlorothiazide 12.5 Mg Caps (Hydrochlorothiazide) .Marland Kitchen... 1 tab by mouth daily  BP today: 172/100 Prior BP: 134/82 (08/30/2009)  Labs Reviewed: K+: 4.1 (05/28/2009) Creat: : 0.78 (05/28/2009)   Chol: 213 (05/28/2009)   HDL: 53 (05/28/2009)   LDL: 143 (05/28/2009)   TG: 85 (  05/28/2009)  Orders: [facility]- Est  Level 4 (04540)  Complete Medication List: 1)  E-z Spacer Devi (Spacer/aero-holding chambers) .... Inhale 1 puff as directed every four hours 2)  Proventil Hfa 108 (90 Base) Mcg/act Aers (Albuterol sulfate) .... Inhale 2 puff using inhaler every four hours 3)  Simvastatin 20 Mg Tabs (Simvastatin) .Marland Kitchen.. 1 by mouth at bedtime 4)   Omeprazole 20 Mg Cpdr (Omeprazole) .Marland Kitchen.. 1 tab by mouth daily 5)  Hydrochlorothiazide 12.5 Mg Caps (Hydrochlorothiazide) .Marland Kitchen.. 1 tab by mouth daily 6)  Loratadine 10 Mg Tabs (Loratadine) .Marland Kitchen.. 1 tab by mouth daily for allergies 7)  Ipratropium Bromide 0.06 % Soln (Ipratropium bromide) .... 2 sprays each nostril three times a day.  do not use more than 4 days. 8)  Guaifenesin 400 Mg Tabs (Guaifenesin) .Marland Kitchen.. 1 tab by mouth every 4 hours as needed for cough (can be bought over the counter)  Patient Instructions: 1)  So sorry to see you looking sick today. 2)  Acetaminophen 650 mg by mouth every 4 hours as needed for fever or pain. 3)  No signs of ear or sinus infection, so no need for antibiotics at this time. 4)  For nasal congestion, I have sent a prescription for Ipratropium nasal spray--use as directed. 5)  Because of your high blood pressure, DO NOT USE anything that contains Pseudoephedrine. 6)  For cough, you can use Guiafenesin as described below--this is over the counter. 7)  I have refilled your Albuterol. 8)  I have refilled your blood pressure medicine--it is important that you start to take that again immediately. 9)  Be sure to drink plenty of fluids and get plenty of rest. 10)  Please schedule a follow-up appointment if you become short of breath or need to use albuterol more than every 4 hours. Prescriptions: HYDROCHLOROTHIAZIDE 12.5 MG CAPS (HYDROCHLOROTHIAZIDE) 1 tab by mouth daily  #90 x 3   Entered and Authorized by:   Romero Belling MD   Signed by:   Romero Belling MD on 01/17/2010   Method used:   Electronically to        [facility] [address] [phone]* (retail)       [address]       [address]       [place], [place]  [address]       Ph: [phone]       Fax: [phone]   RxID:   [id number] PROVENTIL HFA 108 (90 BASE) MCG/ACT AERS (ALBUTEROL SULFATE) Inhale 2 puff using inhaler every four hours  #1 x 11   Entered and Authorized by:   Romero Belling MD   Signed  by:   Romero Belling MD on 01/17/2010   Method used:   Electronically to        [facility] [address] [phone]* (retail)       [address]       [address]       [place], [place]  [address]       Ph: [phone]       Fax: [phone]   RxID:   [id number] IPRATROPIUM BROMIDE 0.06 % SOLN (IPRATROPIUM BROMIDE) 2 sprays each nostril three times a day.  Do not use more than 4 days.  #1 x 0   Entered and Authorized by:   Romero Belling MD   Signed by:   Romero Belling MD on 01/17/2010   Method used:   Electronically to        [facility] [address] #  [id number]* (retail)       [address]       [address]       [place], [place]  [address]       Ph: [phone]       Fax: [phone]   RxID:   [id number]   Appended Document: ears"plugged"/[place]/mayans     Allergies: 1)  ! Codeine 2)  Sulfamethoxazole (Sulfamethoxazole) 3)  Metronidazole (Metronidazole)   Complete Medication List: 1)  E-z Spacer Devi (Spacer/aero-holding chambers) .... Inhale 1 puff as directed every four hours 2)  Proventil Hfa 108 (90 Base) Mcg/act Aers (Albuterol sulfate) .... Inhale 2 puff using inhaler every four hours 3)  Simvastatin 20 Mg Tabs (Simvastatin) .Marland Kitchen.. 1 by mouth at bedtime 4)  Omeprazole 20 Mg Cpdr (Omeprazole) .Marland Kitchen.. 1 tab by mouth daily 5)  Hydrochlorothiazide 12.5 Mg Caps (Hydrochlorothiazide) .Marland Kitchen.. 1 tab by mouth daily 6)  Loratadine 10 Mg Tabs (Loratadine) .Marland Kitchen.. 1 tab by mouth daily for allergies 7)  Ipratropium Bromide 0.06 % Soln (Ipratropium bromide) .... 2 sprays each nostril three times a day.  do not use more than 4 days. 8)  Guaifenesin 400 Mg Tabs (Guaifenesin) .Marland Kitchen.. 1 tab by mouth every 4 hours as needed for cough (can be bought over the counter)  Other Orders: Pulse Oximetry- [facility] (94760)
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
## 2010-11-20 NOTE — Assessment & Plan Note (Signed)
Summary: stomach pain/pt agreed to pay $20/eo   Vital Signs:  Patient profile:   45 year old female Height:      62.75 inches Weight:      137 pounds BMI:     24.55 Temp:     97.8 degrees F oral Pulse rate:   52 / minute BP sitting:   149 / 98  (left arm) Cuff size:   regular  Vitals Entered By: Tessie Fass CMA (September 22, 2010 8:49 AM) CC: abdominal pain, vag discharge Pain Assessment Patient in pain? yes     Location: abdomen Intensity: 4   Primary Care Provider:  Demetria Pore MD  CC:  abdominal pain and vag discharge.  History of Present Illness: 45 yo F:  1. STD check: Had unprotected sex recently. Wants testing. Had recent RPR/HIV.  2. Vaginal DC: c/o that she is "usually moist." Vaginal DC is thin, clear, with odor. No itch. Mild suprapubic cramping, intermittent, x 2 days. No dysuria, fever/chills, back pain, rash, N/V/D.   3. Constipation: No BM in 3-4 days. Usually hard. No melena or BRBPR.  Current Medications (verified): 1)  E-Z Spacer  Devi (Spacer/aero-Holding Chambers) .... Inhale 1 Puff As Directed Every Four Hours 2)  Proventil Hfa 108 (90 Base) Mcg/act Aers (Albuterol Sulfate) .... Inhale 2 Puff Using Inhaler Every Four Hours 3)  Omeprazole 20 Mg Cpdr (Omeprazole) .Marland Kitchen.. 1 Tab By Mouth Daily 4)  Hydrochlorothiazide 12.5 Mg Caps (Hydrochlorothiazide) .Marland Kitchen.. 1 Tab By Mouth Daily 5)  Fluconazole 150 Mg Tabs (Fluconazole) .... Take 1 Pill By Mouth One Time 6)  Tramadol Hcl 50 Mg Tabs (Tramadol Hcl) .... Take 1 Tab By Mouth Every 6 Hrs For The First 3 Days, Then Take Every 6-8 Hours As Needed For Back Pain. 7)  Ibuprofen 800 Mg Tabs (Ibuprofen) .Marland Kitchen.. 1 Tab By Mouth Q6-8 Hours As Needed For Breakthrough Pain. Stagger With Tramadol. 8)  Miralax   Powd (Polyethylene Glycol 3350) .Marland Kitchen.. 17g By Mouth Once Daily As Needed Constipation  Allergies (verified): 1)  ! Codeine 2)  Sulfamethoxazole (Sulfamethoxazole) 3)  Metronidazole (Metronidazole) PMH-FH-SH  reviewed for relevance  Review of Systems      See HPI  Physical Exam  General:  Well-developed, well-nourished, in no acute distress; alert, appropriate and cooperative throughout examination. Vitals reviewed. Abdomen:  Bowel sounds positive,abdomen soft and non-tender without masses, organomegaly or hernias noted. Genitalia:  Pelvic Exam:        External: normal female genitalia without lesions or masses        Vagina: normal without lesions or masses        Cervix: normal without lesions or masses        Adnexa: normal bimanual exam without masses or fullness        Uterus: normal by palpation        Pap smear: not performed Skin:  Intact without suspicious lesions or rashes.   Impression & Recommendations:  Problem # 1:  VAGINAL DISCHARGE (ICD-623.5) Assessment New  Orders: Wet Prep- [facility] (40981) [facility]- Est  Level 4 (19147)  Problem # 2:  SEXUAL ACTIVITY, HIGH RISK (ICD-V69.2) Assessment: New  Check GC/Chlam, wet prep.   Orders: [facility]- Est  Level 4 (99214)  Problem # 3:  CONSTIPATION (ICD-564.00) Assessment: New  Rec high iber diet. Rx Miralax. Her updated medication list for this problem includes:    Miralax Powd (Polyethylene glycol 3350) .Marland KitchenMarland KitchenMarland KitchenMarland Kitchen 17g by mouth once daily as needed constipation  Orders: [facility]- Est  Level 4 (99214)  Complete Medication List: 1)  E-z Spacer Devi (Spacer/aero-holding chambers) .... Inhale 1 puff as directed every four hours 2)  Proventil Hfa 108 (90 Base) Mcg/act Aers (Albuterol sulfate) .... Inhale 2 puff using inhaler every four hours 3)  Omeprazole 20 Mg Cpdr (Omeprazole) .Marland Kitchen.. 1 tab by mouth daily 4)  Hydrochlorothiazide 12.5 Mg Caps (Hydrochlorothiazide) .Marland Kitchen.. 1 tab by mouth daily 5)  Fluconazole 150 Mg Tabs (Fluconazole) .... Take 1 pill by mouth one time 6)  Tramadol Hcl 50 Mg Tabs (Tramadol hcl) .... Take 1 tab by mouth every 6 hrs for the first 3 days, then take every 6-8 hours as needed for back pain. 7)  Ibuprofen 800 Mg Tabs  (Ibuprofen) .Marland Kitchen.. 1 tab by mouth q6-8 hours as needed for breakthrough pain. stagger with tramadol. 8)  Miralax Powd (Polyethylene glycol 3350) .Marland Kitchen.. 17g by mouth once daily as needed constipation  Other Orders: GC/Chlamydia-[facility] (87591/87491)  Patient Instructions: 1)  It was nice to meet you today! 2)  We will call with the results of your labs. 3)  Take Miralax as needed for constipation.   Orders Added: 1)  GC/Chlamydia-[facility] [87591/87491] 2)  Wet Prep- [facility] [87210] 3)  [facility]- Est  Level 4 [[address]]    Laboratory Results  Date/Time Received: September 22, 2010 8:58 AM  Date/Time Reported: September 22, 2010 9:32 AM   [employer] Source: vag WBC/hpf: 1-5 Bacteria/hpf: 3+  Cocci Clue cells/hpf: many  Positive whiff Yeast/hpf: none Trichomonas/hpf: none Comments: ...............test performed by......Marland KitchenBonnie A. [place], MLS (ASCP)cm     Appended Document:  Patient with BV. Rx Clindamycin as she is allergic to Flagyl. Please inform patient and send to preferred pharmacy. Helane Rima DO  September 22, 2010 11:39 AM    Clinical Lists Changes  Medications: Added new medication of CLEOCIN 300 MG CAPS (CLINDAMYCIN HCL) one by mouth daily x 7 days - Signed Rx of CLEOCIN 300 MG CAPS (CLINDAMYCIN HCL) one by mouth daily x 7 days;  #7 x 0;  Signed;  Entered by: Helane Rima DO;  Authorized by: Helane Rima DO;  Method used: Historical    Prescriptions: CLEOCIN 300 MG CAPS (CLINDAMYCIN HCL) one by mouth daily x 7 days  #7 x 0   Entered and Authorized by:   Helane Rima DO   Signed by:   Arlyss Repress CMA, on 09/22/2010   Method used:   Historical   RxID:   [id number]

## 2010-11-20 NOTE — Letter (Signed)
Summary: Generic Letter  [facility]     [place], [place]    Phone:   Fax:     07/25/2010  Sara Day [address] [place], [place]  [address]  Dear Ms. Schou,   I am pleased to inform you that your results for Gonorrhea and Chlamydia were negative.  I am also writing to inform you that you need to make an appointment to come in for your yearly well-woman visit, which includes a PAP smear. Your most recent PAP smear in 8/10 was normal, but we like to do them every year. Please call our office to schedule an appointment with me, Dr. Fara Boros.   In addition, you will need to schedule an appointment with the lab to have FASTING lipids as well as some other blood work done before you see me.        Sincerely,   Demetria Pore MD  Appended Document: Generic Letter mailed.

## 2010-11-20 NOTE — Miscellaneous (Signed)
Summary: WAITING FOR PT TO CALL BACK/TS  Clinical Lists Changes called pt and lmvm to call back. Please inform pt, that meds were sent to her pharmacy. Dx: BV Arlyss Repress CMA,  September 22, 2010 2:42 PM   Spoke with pt and informed.Jimmy Footman, CMA  September 22, 2010 3:05 PM

## 2010-12-31 ENCOUNTER — Ambulatory Visit (INDEPENDENT_AMBULATORY_CARE_PROVIDER_SITE_OTHER): Payer: Self-pay | Admitting: Family Medicine

## 2010-12-31 ENCOUNTER — Other Ambulatory Visit: Payer: Self-pay | Admitting: Family Medicine

## 2010-12-31 VITALS — BP 126/81 | HR 57 | Temp 97.9°F | Ht 62.0 in | Wt 130.8 lb

## 2010-12-31 DIAGNOSIS — A499 Bacterial infection, unspecified: Secondary | ICD-10-CM

## 2010-12-31 DIAGNOSIS — B9689 Other specified bacterial agents as the cause of diseases classified elsewhere: Secondary | ICD-10-CM

## 2010-12-31 DIAGNOSIS — E7800 Pure hypercholesterolemia, unspecified: Secondary | ICD-10-CM

## 2010-12-31 DIAGNOSIS — R109 Unspecified abdominal pain: Secondary | ICD-10-CM | POA: Insufficient documentation

## 2010-12-31 DIAGNOSIS — N760 Acute vaginitis: Secondary | ICD-10-CM

## 2010-12-31 LAB — COMPREHENSIVE METABOLIC PANEL
ALT: <8 U/L (ref 0–35)
AST: 13 U/L (ref 0–37)
CO2: 24 mEq/L (ref 19–32)
Calcium: 8.6 mg/dL (ref 8.4–10.5)
Chloride: 107 mEq/L (ref 96–112)
Potassium: 3.9 mEq/L (ref 3.5–5.3)
Sodium: 140 mEq/L (ref 135–145)
Total Protein: 6.4 g/dL (ref 6.0–8.3)

## 2010-12-31 LAB — CONVERTED CEMR LAB
AST: 13 units/L (ref 0–37)
BUN: 10 mg/dL (ref 6–23)
Basophils Relative: 1 % (ref 0–1)
Calcium: 8.6 mg/dL (ref 8.4–10.5)
Chloride: 107 meq/L (ref 96–112)
Creatinine, Ser: 0.84 mg/dL (ref 0.40–1.20)
Eosinophils Absolute: 0.6 10*3/uL (ref 0.0–0.7)
Eosinophils Relative: 11 % — ABNORMAL HIGH (ref 0–5)
HCT: 36.2 % (ref 36.0–46.0)
HDL: 58 mg/dL (ref >39)
Hemoglobin: 11.9 g/dL — ABNORMAL LOW (ref 12.0–15.0)
Lipase: 21 units/L (ref 0–75)
MCHC: 32.9 g/dL (ref 30.0–36.0)
MCV: 87.9 fL (ref 78.0–100.0)
Monocytes Absolute: 0.3 10*3/uL (ref 0.1–1.0)
Monocytes Relative: 7 % (ref 3–12)
Neutro Abs: 1.5 10*3/uL — ABNORMAL LOW (ref 1.7–7.7)
RBC: 4.12 M/uL (ref 3.87–5.11)
Total Bilirubin: 0.8 mg/dL (ref 0.3–1.2)
Total CHOL/HDL Ratio: 3.6

## 2010-12-31 LAB — CBC WITH DIFFERENTIAL/PLATELET
Basophils Absolute: 0 10*3/uL (ref 0.0–0.1)
Lymphocytes Relative: 51 % — ABNORMAL HIGH (ref 12–46)
Lymphs Abs: 2.5 10*3/uL (ref 0.7–4.0)
MCV: 87.9 fL (ref 78.0–100.0)
Neutro Abs: 1.5 10*3/uL — ABNORMAL LOW (ref 1.7–7.7)
Neutrophils Relative %: 30 % — ABNORMAL LOW (ref 43–77)
Platelets: 242 10*3/uL (ref 150–400)
RBC: 4.12 MIL/uL (ref 3.87–5.11)
WBC: 4.9 10*3/uL (ref 4.0–10.5)

## 2010-12-31 LAB — POCT WET PREP (WET MOUNT)

## 2010-12-31 LAB — LIPASE: Lipase: 21 U/L (ref 0–75)

## 2010-12-31 LAB — LIPID PANEL: LDL Cholesterol: 134 mg/dL — ABNORMAL HIGH (ref 0–99)

## 2010-12-31 MED ORDER — CLINDAMYCIN HCL 300 MG PO CAPS: 300.0000 mg | ORAL_CAPSULE | Freq: Two times a day (BID) | ORAL | Status: DC

## 2010-12-31 NOTE — Patient Instructions (Signed)
Start taking omeprazole, an acid medicine over the counter Alcohol can irritate your stomach and I would avoid any alcohol. Today we did some tests for STD's and some bloodwork to start figuring out why you are having abdominal pain Make follow-up with your regular doctor- Dr. Fara Boros to follow-up with your pain and to get a checkup

## 2010-12-31 NOTE — Assessment & Plan Note (Signed)
Will draw lipid panel today and patient to return to discuss with PCP in 1-2 weeks

## 2011-01-01 LAB — GC/CHLAMYDIA PROBE AMP, GENITAL: GC Probe Amp, Genital: NEGATIVE

## 2011-01-01 NOTE — Progress Notes (Signed)
  Subjective:    Patient ID: Sara Day, female    DOB: January 05, 1966, 45 y.o.   MRN: [id number]  HPIPatient states has had vaginal discharge and abdominal pain x 2 weeks.  She is a poor historian and many descriptions are vague.  Intermittent, points to entire abdomen.  States worsens with alcohol and food intake.  Cannot describe pain other than "it hurts"  No nausea, vomiting, fever, chills, diarrhea, constipation, melena, BRBPR.  She states she only drinks "a few" beers during the weekend.  Has not been taking omeprazole.  She notes normal monthly periods, no recent STD.    Review of SystemsSee HPI    Objective:   Physical Exam  Constitutional: She appears well-developed and well-nourished.  HENT:  Head: Normocephalic and atraumatic.  Mouth/Throat: No oropharyngeal exudate.  Eyes: Conjunctivae and EOM are normal. Pupils are equal, round, and reactive to light. No scleral icterus.  Neck: Normal range of motion. Neck supple. No thyromegaly present.  Cardiovascular: Normal rate, regular rhythm and normal heart sounds.   No murmur heard. Pulmonary/Chest: Effort normal and breath sounds normal. No respiratory distress. She has no wheezes. She has no rhonchi. She has no rales.  Abdominal: Soft. Bowel sounds are normal. She exhibits no mass. There is no hepatosplenomegaly. There is tenderness. There is no rebound and no guarding.       Mild tenderness in all areas palpation, not localized.  Genitourinary: Uterus normal. No breast tenderness or discharge. Cervix exhibits no motion tenderness, no discharge and no friability. Right adnexum displays no mass, no tenderness and no fullness. Left adnexum displays no mass and no tenderness. There is bleeding around the vagina. No vaginal discharge found.  Musculoskeletal: She exhibits no edema.  Lymphadenopathy:    She has no cervical adenopathy.          Assessment & Plan:

## 2011-01-01 NOTE — Assessment & Plan Note (Signed)
Patient states she has tried flagyl several times and develops a rash every time.  Will treat with oral clindamycin.  If has recurrence would be reluctant to continue to treat with oral clindamycin, would try clindamycin vaginal suppository next.

## 2011-01-01 NOTE — Assessment & Plan Note (Addendum)
vague abdominal pain x 2 weeks.  Does not appear to be related to PID, will terat for BV.  CMET and Lipase do not suggest pancreatic or gallbladder etiology.   Most likely gastritis as I suspect she drinks more heavily than she suggests today.  She is not receptive to alcohol cessation counseling today.   Advised to decrease alcohol intake, restart omeprazole, and given she seems to have difficulty understanding medical information, advised follow-up in 1-2 weeks for recheck of abdominal pain with PCP and will review test results at that time in person. I spent >30 minutes with the patient, over 50% of which was spent in counseling and/or coordination of care as noted above.

## 2011-01-02 ENCOUNTER — Encounter: Payer: Self-pay | Admitting: Family Medicine

## 2011-01-03 LAB — URINALYSIS, ROUTINE W REFLEX MICROSCOPIC
Bilirubin Urine: NEGATIVE
Ketones, ur: 15 mg/dL — AB
Nitrite: NEGATIVE
Protein, ur: NEGATIVE mg/dL
pH: 6 (ref 5.0–8.0)

## 2011-01-03 LAB — BASIC METABOLIC PANEL
BUN: 11 mg/dL (ref 6–23)
CO2: 24 mEq/L (ref 19–32)
Chloride: 108 mEq/L (ref 96–112)
Glucose, Bld: 98 mg/dL (ref 70–99)
Potassium: 4 mEq/L (ref 3.5–5.1)
Sodium: 138 mEq/L (ref 135–145)

## 2011-01-03 LAB — CBC
HCT: 36.9 % (ref 36.0–46.0)
Hemoglobin: 12.8 g/dL (ref 12.0–15.0)
MCHC: 34.6 g/dL (ref 30.0–36.0)
MCV: 89.6 fL (ref 78.0–100.0)
Platelets: 254 10*3/uL (ref 150–400)
RDW: 12.9 % (ref 11.5–15.5)

## 2011-01-03 LAB — DIFFERENTIAL
Basophils Absolute: 0 10*3/uL (ref 0.0–0.1)
Basophils Relative: 1 % (ref 0–1)
Eosinophils Absolute: 1.2 10*3/uL — ABNORMAL HIGH (ref 0.0–0.7)
Eosinophils Relative: 16 % — ABNORMAL HIGH (ref 0–5)
Lymphocytes Relative: 35 % (ref 12–46)
Monocytes Absolute: 0.5 10*3/uL (ref 0.1–1.0)

## 2011-01-14 ENCOUNTER — Encounter: Payer: Self-pay | Admitting: Family Medicine

## 2011-01-14 ENCOUNTER — Ambulatory Visit (INDEPENDENT_AMBULATORY_CARE_PROVIDER_SITE_OTHER): Payer: Self-pay | Admitting: Family Medicine

## 2011-01-14 VITALS — BP 139/86 | HR 54 | Temp 97.9°F | Ht 61.0 in | Wt 136.0 lb

## 2011-01-14 DIAGNOSIS — M549 Dorsalgia, unspecified: Secondary | ICD-10-CM

## 2011-01-14 DIAGNOSIS — F329 Major depressive disorder, single episode, unspecified: Secondary | ICD-10-CM

## 2011-01-14 MED ORDER — IBUPROFEN 800 MG PO TABS: 800.0000 mg | ORAL_TABLET | Freq: Three times a day (TID) | ORAL | Status: AC | PRN

## 2011-01-14 MED ORDER — CYCLOBENZAPRINE HCL 10 MG PO TABS: ORAL_TABLET | ORAL | Status: DC

## 2011-01-14 NOTE — Patient Instructions (Signed)
I will prescribe anti-inflammatory pain medicine (ibuprofen) and muscle relaxant (flexeril) for your pain.   It is important you work on back exercises to help prevent your back from flaring up. I want you to be stronger and be able to do all the things you need to do in life and daily exercise will be important to get better.

## 2011-01-14 NOTE — Assessment & Plan Note (Signed)
Symptomatic care with NSAIDS, Flexeril, Tylenol.  Discussed that recurring pain is best treated with prevention and not chronic medications, discussed back exercises and given handout.  No insurance, not able to refer to physical therapy at this time.

## 2011-01-14 NOTE — Assessment & Plan Note (Signed)
-  Denies current symptoms
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
## 2011-03-03 NOTE — Cardiovascular Report (Signed)
NAMECARRIEANN, Sara Day              ACCOUNT NO.:  [id number]   MEDICAL RECORD NO.:  [id number]          PATIENT TYPE:  OBV   LOCATION:  4731                         FACILITY:  [facility]   PHYSICIAN:  Thereasa Solo. Little, M.D. DATE OF BIRTH:  01-17-1966   DATE OF PROCEDURE:  05/04/2007  DATE OF DISCHARGE:  05/04/2007                            CARDIAC CATHETERIZATION   INDICATIONS FOR TEST:  This 45 year old female was admitted with chest  pain.  She had had a bronchitis-type cough for several months in  duration.  She was admitted with chest pain that was somewhat pleuritic.  She has been on Motrin for the same.  We offered her the option of a  nuclear study.  She preferred cardiac catheterization.  She does have a  family history, her father had an infarct in his 62s.   After obtaining informed consent, the patient was prepped and draped in  the usual sterile fashion exposing the right groin.  Following local  anesthetic with 1% Xylocaine, the Seldinger technique was employed and a  5 [place] introducer sheath was placed into the right femoral artery.  Left and right coronary arteriography and ventriculography in the RAO  projection was performed.   COMPLICATIONS:  None.   EQUIPMENT:  5 French Judkins configuration catheters and a JL-3.5  catheter were required to cannulate the left system.   TOTAL CONTRAST:  85 mL   RESULTS:  1. Hemodynamic monitoring:  Central aortic pressure was 122/67, left      ventricular pressure was 125/2, and the end-diastolic pressure was      9.  2. Coronary arteriography:  No calcification was seen on fluoroscopy.   1. Left main normal.  It bifurcated.  2. LAD:  The LAD extended to the apex.  The heart had to small      diagonal branches both of which were free of disease.  3. Circumflex.  The circumflex was a left dominant system was a very      large first OM-2, two smaller second OMs, and the PDA, all of which      were free of disease.  4. Right  coronary artery, small nondominant vessel.   Ventriculography.  Ventriculography in the RAO projection revealed no  wall motion abnormalities.  Ejection fraction was 50-55%.   ASSESSMENT:  1. No evidence of coronary disease.  2. Low-normal ejection fraction.   It appears that her chest pain is pleuritic in nature and, as a result  of this, I see no reason she needs to stay the hospital.  She will need  an antitussive following her discharge and will need aggressive  treatment for bronchitis.           ______________________________  Thereasa Solo. Little, M.D.     ABL/MEDQ  D:  05/04/2007  T:  05/05/2007  Job:  [id number]   cc:   [facility], Dr. Leveda Anna  Cath Lab

## 2011-03-06 NOTE — Op Note (Signed)
[place]  Patient:    Sara Day, Sara Day                     MRN: [id number] Proc. Date: 05/06/00 Adm. Date:  [id number] Disc. Date: [id number] Attending:  Abigail Miyamoto A                           Operative Report  PREOPERATIVE DIAGNOSIS:  Left breast mass.  POSTOPERATIVE DIAGNOSIS:  Left breast mass.  PROCEDURE:  Excision of left breast mass.  SURGEON:  Abigail Miyamoto, M.D.  ANESTHESIA:  1% Lidocaine and monitored anesthesia care.  ESTIMATED BLOOD LOSS:  Minimal.  PROCEDURE IN DETAIL:  The patient was brought to the operating room, identified as Sara Day, placed supine on the operating table, and anesthesia was induced.  Her left breast was then prepped and draped in the usual sterile fashion.  The skin overlying the left breast mass in the left upper outer quadrant was then anesthetized with 1% Lidocaine.  A small transverse incision was made over the top of the overlying mass.  Dissection was then carried down into the breast tissue with the electrocautery.  The mass was identified and grasped with an Allis clamp and elevated out of the wound.  Electrocautery was then used to completely excise the mass circumferentially.  Once the mass was removed it was sent to pathology for identification.  The cavity was then irrigated with normal saline.  Hemostasis appeared to be achieved.  Subcutaneous layer was then closed with interrupted 3-0 Monocryl sutures and the skin was closed with running 4-0 Monocryl suture. Steri-Strips, gauze tape were then applied.  The patient tolerated the procedure well.  All sponge, needle, and instrument counts were correct at the end of the procedure.  The patient was then taken in stable condition from the operating room to the recovery room. DD:  05/06/00 TD:  05/07/00 Job: [id number]

## 2011-03-06 NOTE — Discharge Summary (Signed)
NAMEDANYAL, ADORNO              ACCOUNT NO.:  [id number]   MEDICAL RECORD NO.:  [id number]          PATIENT TYPE:  INP   LOCATION:  4731                         FACILITY:  [facility]   PHYSICIAN:  Santiago Bumpers. Hensel, M.D.DATE OF BIRTH:  Sep 12, 1966   DATE OF ADMISSION:  05/02/2007  DATE OF DISCHARGE:  05/04/2007                               DISCHARGE SUMMARY   PRIMARY CARE Chidinma Clites:  Neena Rhymes, M.D.   CONSULTANTS:  Cardiology.   PROCEDURE:  Cardiac catheterization.   REASON FOR ADMISSION:  The patient is a 45 year old female who presented  to the clinic with chest pain, nausea, diaphoresis and shortness of  breath.  She took a friend's nitroglycerin which did help to relieve the  pain.   PRIMARY DISCHARGE DIAGNOSIS:  Atypical chest pain.   SECONDARY DIAGNOSIS:  Post-viral bronchitis.   HOSPITAL COURSE:  1. Chest pain.  Cardiac enzymes were checked.  Three sets were      negative.  The patient's pain had features of typical chest pain in      that it was described as a pressure radiating to the left shoulder.      It was accompanied by diaphoresis, nausea and relived by      nitroglycerin given to her by a friend, and the patient's chest      pain also had some features of musculoskeletal pain including the      fact that it was exacerbated touch.  She reports she was tender to      palpation and the pain was reproducible on palpation.  Also there      may be anxiety components or GI components to the pain.  The      patient and her family history as her father who died of a heart      attack in his mid-40s.  This does increase our concern for cardiac      chest pain.  Three sets of cardiac enzymes were check, all of which      were negative.  However, when questioned the patient reported that      she was still feeling chest pain, thus cardiology was consulted      given the patient's serious family history.  Cardiology spoke with      her and recommended an outpatient  Myoview, however, the patient was      uncomfortable with that and because the patient had such a strong      family history it was decided that she would receive a      catheterization.  The patient was catheterized on May 05, 2007.      The results showed no evidence of coronary disease and a low-normal      ejection fraction.  Ejection fraction was measured to be 50 to 55%.      The report states that the chest pain is pleuritic and the      cardiologist felt that it may be related to her post-viral      bronchitis.  The patient was discharged following catheterization      with no complications.  2. Possible alcohol abuse.  Past records indicate that the patient has      a history of alcohol use. It is unclear how much the patient was      drinking but there was some concern for withdrawal.  Her Ativan was      not started as the patient reported she was only, was drinking a      relatively small amount less than 7 drinks a week.  The patient was      not started on Ativan protocol.  Her vital signs all remained      within normal limits and this was not a problem during this      hospitalization.  3. Post-viral bronchitis.  The patient approximately 1 month ago had      an upper respiratory infection and had some residual coughing ever      since.  This may be part of the explanation for her chest pain.      She had been on albuterol spray p.r.n.  During her hospital stay we      changed her to albuterol 90 mcg MDI 2 puffs every 6 hours      scheduled.  We instructed her to continue this regimen for 1 week      after her hospitalization and then may use on a p.r.n. basis.  Also      instructed the patient that she may take over-the-counter ibuprofen      400 mg every 4 to 6 hours as needed for pain.  If there is a      musculoskeletal component to her chest pain this should help.   CONDITION:  At the time of discharge is stable.   PENDING TEST RESULTS:  At the time of discharge  none.   DISPOSITION:  The patient was discharged home.   DISCHARGE FOLLOWUP:  The patient is to follow up with Dr. Beverely Low at the  family practice clinic on May 23, 2007, at 4:15 p.m.   FOLLOWUP ISSUES:  1. Management of her post-viral cough as this may be contributing to      her chest pain.  2. Follow up as to whether or not the patient is a good candidate for      Zocor or another statin.  Her LDL was 137 and this issue can be      addressed with Dr. Beverely Low in the clinic.      Asher Muir, MD  Electronically Signed      Santiago Bumpers. Leveda Anna, M.D.  Electronically Signed    SO/MEDQ  D:  05/05/2007  T:  05/06/2007  Job:  [id number]   cc:   Neena Rhymes, M.D.

## 2011-03-30 ENCOUNTER — Ambulatory Visit: Payer: Self-pay | Admitting: Family Medicine

## 2011-04-03 ENCOUNTER — Encounter: Payer: Self-pay | Admitting: Family Medicine

## 2011-04-03 ENCOUNTER — Ambulatory Visit (INDEPENDENT_AMBULATORY_CARE_PROVIDER_SITE_OTHER): Payer: Self-pay | Admitting: Family Medicine

## 2011-04-03 VITALS — BP 139/87 | HR 52 | Temp 98.0°F | Ht 61.0 in | Wt 133.0 lb

## 2011-04-03 DIAGNOSIS — N898 Other specified noninflammatory disorders of vagina: Secondary | ICD-10-CM

## 2011-04-03 DIAGNOSIS — N760 Acute vaginitis: Secondary | ICD-10-CM

## 2011-04-03 DIAGNOSIS — B9689 Other specified bacterial agents as the cause of diseases classified elsewhere: Secondary | ICD-10-CM

## 2011-04-03 DIAGNOSIS — A499 Bacterial infection, unspecified: Secondary | ICD-10-CM

## 2011-04-03 DIAGNOSIS — Z309 Encounter for contraceptive management, unspecified: Secondary | ICD-10-CM

## 2011-04-03 MED ORDER — CLINDAMYCIN HCL 300 MG PO CAPS: 300.0000 mg | ORAL_CAPSULE | Freq: Two times a day (BID) | ORAL | Status: DC

## 2011-04-03 MED ORDER — MEDROXYPROGESTERONE ACETATE 150 MG/ML IM SUSP
150.0000 mg | Freq: Once | INTRAMUSCULAR | Status: AC
  Administered 2011-04-03: 150 mg via INTRAMUSCULAR

## 2011-04-03 NOTE — Assessment & Plan Note (Signed)
Pt wants depo. Neg u preg today in clinic. F/u 3 months for nurse visit for next injection.

## 2011-04-03 NOTE — Assessment & Plan Note (Signed)
Pt w/ vaginal discharge, having unprotected intercourse. Had BV a few months ago, unable to do wet prep but d/c is thick, clear, c/w BV >> yeast. Also checking GC/Chlam as pt concerned about STD as well. Will treat for BV. Pt to follow up if not improved after abx course.

## 2011-04-03 NOTE — Progress Notes (Signed)
Pt comes in today for birth control management. Desires depo. Has had in the past.  Pt also w/ complaint of vaginal discharge. It has been going on for ~1 week, thick, clear, not itchy, no smell. No vaginal bleeding. Had BV a few months ago and thinks this feels the same. No abdominal pain this time. No fevers/chills/n/v. No dysuria.   PE: NAD, appropriate GYN: speculum exam: minimal, thick, clear/white vaginal discharge, cervical os normal/not friable, no obvious vaginal or cervical lesions. No cervical motion tenderness or abd tenderness.   A/P: -unable to do wet prep as lab is currently restricted with what POCT can be done; GC/Chlam sent, will f/u.  Will treat empirically for BV as pt has had this multiple times in the past -pt is to follow up if d/c does not improve or if symptoms worsen -pt given Depo injection today after neg upreg -f/u 3 month for NV for depo; f/u with MD PRN.

## 2011-04-03 NOTE — Assessment & Plan Note (Addendum)
From previous visit when pt dx with BV; unable to do wet prep today as lab is currently modified, will treat similarly since pt got better w/ po clinda: (Patient states she has tried flagyl several times and develops a rash every time.  Will treat with oral clindamycin.  If has recurrence would be reluctant to continue to treat with oral clindamycin, would try clindamycin vaginal suppository next.)  Suggested pt try using condom as this sometimes helps with vaginal irritation/BV from sperm, esp if this is the same partner.

## 2011-04-04 LAB — GC/CHLAMYDIA PROBE AMP, GENITAL
Chlamydia, DNA Probe: NEGATIVE
GC Probe Amp, Genital: NEGATIVE

## 2011-04-06 ENCOUNTER — Encounter: Payer: Self-pay | Admitting: Family Medicine

## 2011-04-08 ENCOUNTER — Telehealth: Payer: Self-pay | Admitting: Family Medicine

## 2011-04-08 NOTE — Telephone Encounter (Signed)
Left message for patient to return call. Please inform that her labs were negative.Jonnie Truxillo, Rodena Medin

## 2011-04-08 NOTE — Telephone Encounter (Signed)
Wants test results

## 2011-04-08 NOTE — Telephone Encounter (Signed)
Pt called and informed of negative results.

## 2011-06-19 ENCOUNTER — Ambulatory Visit: Payer: Self-pay

## 2011-07-22 ENCOUNTER — Encounter: Payer: Self-pay | Admitting: Family Medicine

## 2011-07-22 ENCOUNTER — Ambulatory Visit (INDEPENDENT_AMBULATORY_CARE_PROVIDER_SITE_OTHER): Payer: Self-pay | Admitting: Family Medicine

## 2011-07-22 DIAGNOSIS — R002 Palpitations: Secondary | ICD-10-CM | POA: Insufficient documentation

## 2011-07-22 DIAGNOSIS — L299 Pruritus, unspecified: Secondary | ICD-10-CM | POA: Insufficient documentation

## 2011-07-22 DIAGNOSIS — I10 Essential (primary) hypertension: Secondary | ICD-10-CM

## 2011-07-22 MED ORDER — OMEPRAZOLE 20 MG PO CPDR: 20.0000 mg | DELAYED_RELEASE_CAPSULE | Freq: Every day | ORAL | Status: DC

## 2011-07-22 MED ORDER — HYDROCHLOROTHIAZIDE 12.5 MG PO TABS: 12.5000 mg | ORAL_TABLET | Freq: Every day | ORAL | Status: DC

## 2011-07-22 MED ORDER — ALBUTEROL SULFATE HFA 108 (90 BASE) MCG/ACT IN AERS: 2.0000 | INHALATION_SPRAY | RESPIRATORY_TRACT | Status: DC | PRN

## 2011-07-22 NOTE — Patient Instructions (Signed)
It was good to see you. I do not think you have scabies. Please come back and see me in 1 month to check on your blood pressure and chest feeling.

## 2011-07-23 ENCOUNTER — Encounter: Payer: Self-pay | Admitting: Family Medicine

## 2011-07-23 NOTE — Assessment & Plan Note (Signed)
Unlikely to be scabies. No rash or erythema noted. Will continue to monitor. Pt mostly wanted reassurance today.

## 2011-07-23 NOTE — Assessment & Plan Note (Signed)
Refilled HCTZ. Encouraged pt to take it every day.  F/u 1 month for BP check and to f/u heart palpitations.

## 2011-07-23 NOTE — Assessment & Plan Note (Signed)
Unsure exact causative agent. Heart exam, WNL today.  Comes and goes, no red flags. Could be PACs/PVCs.  Told pt to go to ER if she has an episode and becomes dizzy or short of breath for evaluation while it is happening.  Will restart BP meds and if it continues, could consider checking labs such as TSH, BMET and EKG.

## 2011-07-23 NOTE — Progress Notes (Signed)
S: Pt comes in today for ?scabies.  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  She is concerned that she has scabies now b/c she has some itching on her shoulders, chest, back, and face for the past few days.  Washed/sterilized everything. Has not seen a rash or bumps.  Just wants to be checked out.   HEART FLUTTERING Pt states that her heart doesn't feel normal.  No pain or pressure, but feels almost like a fluttering sensation/funny feeling.  Has been happening every 3 days or so for the past 1-2 weeks, which is similar to the amount of time that she has been out of her HCTZ.  Sometimes has SOB with this, sometimes has some nausea. No dizziness or syncope. No LE edema, no chest pain.  Has also been taking benadryl, robitussin, and theraflu for the past week or so for a cold, but this the heart fluttering started before taking any cold medicines. No associated with activity or any other causative event.  It lasts for a few minutes then goes away on its own.  Not having it currently.    HYPERTENSION BP: 145/94 Meds: hctz, not taking Taking meds: No    # of doses missed/week: ALL x a few weeks Symptoms: Headache: Yes : few times per week, no other associated symptoms  Dizziness: No Vision changes: No SOB:  Yes : occasionally w/ heart fluttering Chest pain: No LE swelling: No Tobacco use: No     ROS: Per HPI  History  Smoking status  . Never Smoker   Smokeless tobacco  . Never Used    O:  Filed Vitals:   07/22/11 1603  BP: 145/94  Pulse: 57  Temp: 98.3 F (36.8 C)    Gen: NAD CV: RRR, no murmur or rub Pulm: CTA bilat, no wheezes or crackles Ext: Warm, no chronic skin changes, no edema, no redness or erythema, no rash or bumps, no pustules, no tracks appreciated.   A/P: 45 y.o. female p/w itching -See problem list -f/u in 1 month

## 2011-08-03 LAB — COMPREHENSIVE METABOLIC PANEL
ALT: 10
AST: 13
Alkaline Phosphatase: 36 — ABNORMAL LOW
CO2: 24
Glucose, Bld: 94
Potassium: 3.5
Sodium: 140
Total Protein: 6

## 2011-08-03 LAB — CBC
Hemoglobin: 11.7 — ABNORMAL LOW
Hemoglobin: 12
MCHC: 34
RBC: 3.94
RBC: 4.03
RBC: 4.03
RDW: 13.5
WBC: 7

## 2011-08-03 LAB — BASIC METABOLIC PANEL
Calcium: 8.2 — ABNORMAL LOW
GFR calc Af Amer: >60
GFR calc non Af Amer: >60
Sodium: 136

## 2011-08-03 LAB — APTT: aPTT: 30

## 2011-08-03 LAB — CARDIAC PANEL(CRET KIN+CKTOT+MB+TROPI)
Relative Index: INVALID
Total CK: 75

## 2011-08-03 LAB — PROTIME-INR
INR: 1.1
Prothrombin Time: 15.6 — ABNORMAL HIGH

## 2011-08-04 LAB — CARDIAC PANEL(CRET KIN+CKTOT+MB+TROPI)
CK, MB: 0.7
Relative Index: INVALID
Relative Index: INVALID
Total CK: 54
Troponin I: 0.02

## 2011-08-04 LAB — BASIC METABOLIC PANEL
CO2: 21
Chloride: 107
Creatinine, Ser: 0.63
GFR calc Af Amer: >60
Potassium: 3.6

## 2011-08-19 ENCOUNTER — Ambulatory Visit (INDEPENDENT_AMBULATORY_CARE_PROVIDER_SITE_OTHER): Payer: Self-pay | Admitting: Family Medicine

## 2011-08-19 ENCOUNTER — Ambulatory Visit (HOSPITAL_COMMUNITY)
Admission: RE | Admit: 2011-08-19 | Discharge: 2011-08-19 | Disposition: A | Discharge status: Home / Self Care | Payer: Self-pay | Source: Ambulatory Visit | Attending: Family Medicine | Admitting: Family Medicine

## 2011-08-19 VITALS — BP 139/95 | HR 80 | Temp 98.5°F | Ht 61.0 in | Wt 136.0 lb

## 2011-08-19 DIAGNOSIS — R059 Cough, unspecified: Secondary | ICD-10-CM

## 2011-08-19 DIAGNOSIS — Z01818 Encounter for other preprocedural examination: Secondary | ICD-10-CM | POA: Insufficient documentation

## 2011-08-19 DIAGNOSIS — R05 Cough: Secondary | ICD-10-CM

## 2011-08-19 DIAGNOSIS — R058 Other specified cough: Secondary | ICD-10-CM

## 2011-08-19 MED ORDER — BENZONATATE 100 MG PO CAPS: 100.0000 mg | ORAL_CAPSULE | Freq: Four times a day (QID) | ORAL | Status: DC | PRN

## 2011-08-19 NOTE — Assessment & Plan Note (Signed)
Symptoms concerning for pneumonia vs bronchitis.  Will get a chest x-ray today.  She does have  A history of asthma but no wheezing on exam today.  If CXR with pneumonia will treat with appropriate abx.  Given length of time since onset of symptoms will consider antibiotic if bronchitis.  Given red flags and suggested continued symptomatic treatment . Rx given for tessalon to help with cough suppresion.

## 2011-08-19 NOTE — Progress Notes (Signed)
SUBJECTIVE:  Sara Day is a 45 y.o. female who complains of congestion, sore throat, nasal blockage, cough described as productive of yellow and tan sputum, chills, mild-moderate shortness of breath during cough and with exertion and hoarseness for x3 weeks. She denies a history of chest pain, fevers, myalgias and wheezing and has a history of asthma. Patient denies smoke cigarettes.  She has been using tylenol, mucinex, and robitussin with only mild relief of symptoms.  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  Ears normal.   Throat and pharynx mildy erythematous without exudate.   Neck supple. No adenopathy in the neck.  Nose is congested. Sinuses non tender.  The chest is without wheezes, there are mild crackles in the LLL.

## 2011-08-19 NOTE — Patient Instructions (Signed)
It was nice seeing you today.  I have sent over a medication to help with your cough.  Please go to registration at the hospital and they will direct you on where to go to get your chest x-ray.  Hopefully I should have those results by this afternoon.

## 2011-08-19 NOTE — Progress Notes (Deleted)
  Subjective:    Patient ID: Sara Day, female    DOB: 11-Mar-1966, 45 y.o.   MRN: [id number]  HPI    Review of Systems     Objective:   Physical Exam        Assessment & Plan:

## 2011-08-20 ENCOUNTER — Telehealth: Payer: Self-pay | Admitting: Family Medicine

## 2011-08-20 NOTE — Telephone Encounter (Signed)
Fwd. To Dr.McGill

## 2011-08-20 NOTE — Telephone Encounter (Signed)
Pt requesting xray results

## 2011-08-21 NOTE — Telephone Encounter (Signed)
Advised pt of CXR results.

## 2011-08-21 NOTE — Telephone Encounter (Signed)
Chest xray was normal. No pneumonia or infection. Chronic changes only. No need for antibiotics

## 2011-08-25 ENCOUNTER — Encounter: Payer: Self-pay | Admitting: Family Medicine

## 2011-08-25 ENCOUNTER — Ambulatory Visit (INDEPENDENT_AMBULATORY_CARE_PROVIDER_SITE_OTHER): Payer: Self-pay | Admitting: Family Medicine

## 2011-08-25 VITALS — BP 148/98 | HR 82 | Temp 98.3°F | Ht 61.0 in | Wt 136.0 lb

## 2011-08-25 DIAGNOSIS — R058 Other specified cough: Secondary | ICD-10-CM

## 2011-08-25 DIAGNOSIS — R05 Cough: Secondary | ICD-10-CM

## 2011-08-25 DIAGNOSIS — I10 Essential (primary) hypertension: Secondary | ICD-10-CM

## 2011-08-25 DIAGNOSIS — R059 Cough, unspecified: Secondary | ICD-10-CM

## 2011-08-25 MED ORDER — BROMPHENIRAMINE-PSEUDOEPH 4-45 MG/5ML PO SYRP: 5.0000 mL | ORAL_SOLUTION | Freq: Three times a day (TID) | ORAL | Status: AC | PRN

## 2011-08-25 MED ORDER — AZITHROMYCIN 500 MG PO TABS: 500.0000 mg | ORAL_TABLET | Freq: Every day | ORAL | Status: AC

## 2011-08-25 NOTE — Patient Instructions (Addendum)
Nice to meet you. I'm giving you antibiotic azithromycin. Take one pill daily for the next 3 days. I'm giving you a cough syrup try at home first to see how it affects you Your blood pressure is a little elevated so you should see your primary care provider again in 2 weeks

## 2011-08-25 NOTE — Assessment & Plan Note (Signed)
Patient does have a productive cough I would consider high-risk for potential bacterial infection with all the kids around her and patient has not had her flu shot. Patient does not have signs of systemic illness at this time but concerned enough that I will treat with azithromycin for the next 3 days. Patient also given a little bit of a cough syrup to take but told her to be careful try the home and see how we do. Patient is to return to clinic in one week if not better.

## 2011-08-25 NOTE — Progress Notes (Signed)
  Subjective:    Patient ID: Sara Day, female    DOB: [birth year], 45 y.o.   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 Patient has had sick contacts in the kids that she is taking care of recently. Patient states that they had the exact same thing. Patient states that she does have a fever at home 100.2 at one time has taken cold medications recently which did seem to bring it down. Patient though is concerned because in her history she states that she had pneumonia at one point after having a head cold like this previously. Patient denies any chills any shortness of breath or any chest pain patient states that the cough though is somewhat productive with some yellow sputum.   Review of Systems As stated in the history of present illness Past medical surgical and family history reviewed with no changes    Objective:   Physical Exam General: In no apparent distress alert and oriented HEENT pupils equal round reactive to light patient does have mild tenderness to palpation of the maxillary sinus patient has significant postnasal drip with significant posterior pharynx erythema mild anterior lymphadenopathy Cardiovascular: Regular rate and rhythm no murmur Pulmonary: Clear to auscultation bilaterally Abdomen: Bowel sounds positive nontender nondistended    Assessment & Plan:

## 2011-08-25 NOTE — Assessment & Plan Note (Signed)
Patient is elevated today but did not take her medications patient is to return in 2 weeks to see her primary care provider at that time recheck her blood pressure and see if we need to increase any of her medications.

## 2011-09-09 ENCOUNTER — Encounter (HOSPITAL_COMMUNITY): Payer: Self-pay

## 2011-09-09 ENCOUNTER — Emergency Department (INDEPENDENT_AMBULATORY_CARE_PROVIDER_SITE_OTHER): Payer: Self-pay

## 2011-09-09 ENCOUNTER — Emergency Department (INDEPENDENT_AMBULATORY_CARE_PROVIDER_SITE_OTHER)
Admission: EM | Admit: 2011-09-09 | Discharge: 2011-09-09 | Disposition: A | Discharge status: Home / Self Care | Payer: Self-pay | Source: Home / Self Care | Attending: Family Medicine | Admitting: Family Medicine

## 2011-09-09 DIAGNOSIS — J45909 Unspecified asthma, uncomplicated: Secondary | ICD-10-CM

## 2011-09-09 DIAGNOSIS — J45901 Unspecified asthma with (acute) exacerbation: Secondary | ICD-10-CM

## 2011-09-09 MED ORDER — AZITHROMYCIN 250 MG PO TABS: 250.0000 mg | ORAL_TABLET | Freq: Every day | ORAL | Status: DC

## 2011-09-09 MED ORDER — PREDNISONE 20 MG PO TABS: 20.0000 mg | ORAL_TABLET | Freq: Every day | ORAL | Status: DC

## 2011-09-09 MED ORDER — ALBUTEROL SULFATE (5 MG/ML) 0.5% IN NEBU
INHALATION_SOLUTION | RESPIRATORY_TRACT | Status: AC
  Filled 2011-09-09: qty 1

## 2011-09-09 MED ORDER — IPRATROPIUM BROMIDE 0.02 % IN SOLN: 0.5000 mg | RESPIRATORY_TRACT | Status: DC

## 2011-09-09 MED ORDER — ALBUTEROL SULFATE HFA 108 (90 BASE) MCG/ACT IN AERS: 2.0000 | INHALATION_SPRAY | RESPIRATORY_TRACT | Status: DC | PRN

## 2011-09-09 MED ORDER — GUAIFENESIN-CODEINE 100-10 MG/5ML PO SYRP: 5.0000 mL | ORAL_SOLUTION | Freq: Four times a day (QID) | ORAL | Status: AC | PRN

## 2011-09-09 MED ORDER — ALBUTEROL SULFATE (5 MG/ML) 0.5% IN NEBU
5.0000 mg | INHALATION_SOLUTION | Freq: Once | RESPIRATORY_TRACT | Status: AC
  Administered 2011-09-09: 5 mg via RESPIRATORY_TRACT

## 2011-09-09 NOTE — ED Provider Notes (Signed)
History     CSN: [id number] Arrival date & time: 09/09/2011 12:31 PM   First MD Initiated Contact with Patient 09/09/11 1227      Chief Complaint  Patient presents with  . Cough    (Consider location/radiation/quality/duration/timing/severity/associated sxs/prior treatment) HPI Comments: Sara Day presents for evaluation of persistent nonproductive cough over the last 2 weeks. She has experienced URI sx over the last 4 weeks she says. She coughs so much that she gets weak, dizzy, and short of breath.   Patient is a 45 y.o. female presenting with cough. The history is provided by the patient.  Cough This is a new problem. The current episode started more than 1 week ago. The problem occurs constantly. The problem has been gradually worsening. The cough is non-productive. There has been no fever. Associated symptoms include rhinorrhea, myalgias, shortness of breath and wheezing. Treatments tried: hx of asthma, on albuterol. The treatment provided no relief. Her past medical history is significant for asthma.    History reviewed. No pertinent past medical history.  History reviewed. No pertinent past surgical history.  History reviewed. No pertinent family history.  History  Substance Use Topics  . Smoking status: Never Smoker   . Smokeless tobacco: Never Used  . Alcohol Use: Not on file    OB History    Grav Para Term Preterm Abortions TAB SAB Ect Mult Living                  Review of Systems  HENT: Positive for congestion and rhinorrhea.   Eyes: Negative.   Respiratory: Positive for cough, choking, shortness of breath and wheezing.   Cardiovascular: Negative.   Gastrointestinal: Negative.   Genitourinary: Negative.   Musculoskeletal: Positive for myalgias.  Neurological: Positive for dizziness, weakness and light-headedness.    Allergies  Codeine; Metronidazole; and Sulfamethoxazole  Home Medications   Current Outpatient Rx  Name Route Sig Dispense Refill  .  ALBUTEROL SULFATE HFA 108 (90 BASE) MCG/ACT IN AERS Inhalation Inhale 2 puffs into the lungs every 4 (four) hours as needed for wheezing. Inhale 2 puff using inhaler every four hours 6.7 g 2  . BENZONATATE 100 MG PO CAPS Oral Take 1 capsule (100 mg total) by mouth every 6 (six) hours as needed for cough. 30 capsule 0  . HYDROCHLOROTHIAZIDE 12.5 MG PO TABS Oral Take 1 tablet (12.5 mg total) by mouth daily. 30 tablet 2  . OMEPRAZOLE 20 MG PO CPDR Oral Take 1 capsule (20 mg total) by mouth daily. 30 capsule 2  . E-Z SPACER DEVI  Inhale 1 puff as directed every four hours       BP 185/107  Pulse 89  Temp(Src) 98.8 F (37.1 C) (Oral)  Resp 23  SpO2 94%  LMP 08/10/2011  Physical Exam  Constitutional: She is oriented to person, place, and time. She appears well-developed and well-nourished.  HENT:  Head: Normocephalic and atraumatic.  Cardiovascular: Normal rate.   Pulmonary/Chest: She has decreased breath sounds in the left lower field. She has wheezes in the right upper field, the right middle field, the right lower field, the left upper field, the left middle field and the left lower field. She has no rhonchi.       Witnessed coughing attack, uncontrollable for several seconds (<20) where patient was visibly distressed and weakened from attack; non-productive  Neurological: She is alert and oriented to person, place, and time.  Skin: Skin is warm and dry.    ED Course  Procedures (  including critical care time)  Labs Reviewed - No data to display Dg Chest 2 View  09/09/2011  *RADIOLOGY REPORT*  Clinical Data: Cough and wheezing.  CHEST - 2 VIEW  Comparison: 08/19/2011  Findings: Two views of the chest demonstrate clear lungs. Heart and mediastinum are within normal limits.  The trachea is midline. Bony structures are intact.  IMPRESSION: No acute chest findings.  Original Report Authenticated By: Richarda Overlie, M.D.     1. Asthma with bronchitis   2. Asthma attack       MDM  After  duoneb: improved breathing and speaking, with decreased but persistent wheezing CXR: no acute findings        Richardo Priest, MD 09/09/11 1422

## 2011-09-09 NOTE — ED Notes (Signed)
C/o cough for 1 month, worse past 2 weeks; audible wheezing, minimal wheezing on ascultation

## 2011-09-14 ENCOUNTER — Ambulatory Visit (INDEPENDENT_AMBULATORY_CARE_PROVIDER_SITE_OTHER): Payer: Self-pay | Admitting: Family Medicine

## 2011-09-14 ENCOUNTER — Encounter: Payer: Self-pay | Admitting: Family Medicine

## 2011-09-14 DIAGNOSIS — R059 Cough, unspecified: Secondary | ICD-10-CM

## 2011-09-14 DIAGNOSIS — R05 Cough: Secondary | ICD-10-CM

## 2011-09-14 DIAGNOSIS — J3489 Other specified disorders of nose and nasal sinuses: Secondary | ICD-10-CM | POA: Insufficient documentation

## 2011-09-14 DIAGNOSIS — R058 Other specified cough: Secondary | ICD-10-CM

## 2011-09-14 MED ORDER — LORATADINE 10 MG PO TABS: 10.0000 mg | ORAL_TABLET | Freq: Every day | ORAL | Status: DC

## 2011-09-14 MED ORDER — FLUTICASONE PROPIONATE 50 MCG/ACT NA SUSP: 1.0000 | Freq: Every day | NASAL | Status: DC

## 2011-09-14 NOTE — Assessment & Plan Note (Signed)
No improvement after azithromycin. Was given prednisone and albuterol inhaler and emergency department on 11/21. Had a negative chest x-ray at that time. Not feel like any of our interventions have helped. Continues to questionably have fevers but lung exam is benign. No need for repeat chest x-ray at this time. Will try treating for allergic rhinitis as this could cause rhinorrhea and cough. Will have patient followup in 2-4 weeks if not improved. If still having productive cough, will further the workup. Differential could include COPD versus Wegener's versus lupus. Could check PFTs, CBC, CMET, CPK, ANA.

## 2011-09-14 NOTE — Assessment & Plan Note (Signed)
Will try treating for allergic rhinitis.  F/u 2-4 weeks.

## 2011-09-14 NOTE — Patient Instructions (Signed)
I'm so sorry that you are still having all of these issues! There is a possibility that all of this is being caused by allergies + viral illness. I am going to start you on an allergy medicine by mouth and a nasal spray. I want you to come back to see me in 2-4 weeks if you are not any better.  If you are still sick, we will do some more blood work.

## 2011-09-14 NOTE — Progress Notes (Signed)
S: Pt comes in today for continued productive cough and rhinorrhea. The symptoms are not new. They have been going on for approximately 5-6 weeks. She's continuing to have breathing problems. She reports mild chest tightness which she says feels muscular in shortness of breath with exertion. She has also had some decreased appetite, stating that food tastes bad in her mouth. She's had no nausea, vomiting, diarrhea. She continues to endorse fevers, most recently was last night. She has not taken her temperature but does have body aches and feels warm. She's tried Mucinex, azithromycin, Robitussin, NyQuil, DayQuil. She's using her albuterol every 15-20 minutes but does not seem to be helping with cough. She continues to have "coughing fits." She has occasional neck pain which she feels is muscular. She also feels like she is having muscle weakness in general. She has not had any ear pain or eye problems. She's not noticed any swollen lymph nodes. She's not had a sore throat.   ROS: Per HPI  History  Smoking status  . Never Smoker   Smokeless tobacco  . Never Used    O:  Filed Vitals:   09/14/11 1037  BP: 152/97  Pulse: 65  Temp: 98.6 F (37 C)    Gen: NAD, occasional cough, + hoarseness HEENT: MMM, no pharyngeal erythema or exudate, no cervical lymphadenopathy, TMs normal bilaterally CV: RRR, no murmur Pulm: CTA bilat, no wheezes, some rhonchi bilateral bases Abd: soft, NT Ext: Warm, no chronic skin changes, no edema   A/P: 45 y.o. female p/w continued productive cough, rhinorrhea -See problem list -f/u in 2-4 weeks, if still not improved will start larger work up, since it will be a prolonged cough at that point without response to standard treatments including albuterol, PPI, azithro/prednisone, antihistamine, nasal spray - consider checking PFTs, CBC, CMET, CPK, ANA (ddx would include COPD, SLE, Wegner's)

## 2011-09-23 ENCOUNTER — Encounter: Payer: Self-pay | Admitting: Family Medicine

## 2011-09-23 ENCOUNTER — Ambulatory Visit (INDEPENDENT_AMBULATORY_CARE_PROVIDER_SITE_OTHER): Payer: Self-pay | Admitting: Family Medicine

## 2011-09-23 VITALS — BP 132/87 | HR 74 | Temp 98.4°F | Ht 61.0 in | Wt 126.0 lb

## 2011-09-23 DIAGNOSIS — R058 Other specified cough: Secondary | ICD-10-CM

## 2011-09-23 DIAGNOSIS — R059 Cough, unspecified: Secondary | ICD-10-CM

## 2011-09-23 DIAGNOSIS — Z309 Encounter for contraceptive management, unspecified: Secondary | ICD-10-CM

## 2011-09-23 DIAGNOSIS — I10 Essential (primary) hypertension: Secondary | ICD-10-CM

## 2011-09-23 DIAGNOSIS — R05 Cough: Secondary | ICD-10-CM

## 2011-09-23 DIAGNOSIS — N912 Amenorrhea, unspecified: Secondary | ICD-10-CM

## 2011-09-23 LAB — POCT URINE PREGNANCY: Preg Test, Ur: NEGATIVE

## 2011-09-23 MED ORDER — NORGESTIMATE-ETH ESTRADIOL 0.25-35 MG-MCG PO TABS: 1.0000 | ORAL_TABLET | Freq: Every day | ORAL | Status: DC

## 2011-09-23 NOTE — Assessment & Plan Note (Signed)
Minimal improvement after starting Claritin and Flonase at last visit. Patient with clear lung fields today, no wheezing heard on exam. No fevers or chills. Patient reports that she feels like she is getting a little better although now she seems to have some GI symptoms. Since patient is improving, will hold off on further workup at this time. Asked patient to come back in 1-2 weeks if cough does not continue to improve or starts to worsen. Also informed patient come back if she becomes febrile.

## 2011-09-23 NOTE — Assessment & Plan Note (Signed)
Negative pregnancy test today. Patient would like to be started on birth control pills. No smoking. No personal or family history of blood clots. Patient is 67, amenorrhea is likely related to perimenopausal state. However, secondary to patient request, will start OCPs.

## 2011-09-23 NOTE — Assessment & Plan Note (Signed)
Well controlled today. Continue current regimen. Blood pressure recheck in 3 months.

## 2011-09-23 NOTE — Progress Notes (Signed)
S: Pt comes in today for amenorrhea.  AMENORRHEA Last period was mid October. Patient states that she always has a period every month within the first 2 weeks of the month. She did not have a period in November. She states that she never misses. She's currently sexually active and not using any form of birth control this time. She is not concerned about STDs. She states she has occasional light vaginal discharge without odor or discomfort. Patient states that this may be stress related as she has been sick over the past couple of months and has had many other stressors. She is interested in starting oral birth control pills today. She tolerated the Depo well but states she cannot afford it.  COUGH Patient continues to have cough but feels like it is slightly better on the Claritin and Flonase. She thinks that it decreased a little and is now happening mostly at night. She's using Robitussin which is also helping. She's not had any further fevers or chills. She states she's feeling a little weak again similarly to how she was feeling during her last visit. She reports no dizziness or syncope. She does state that she is now having some nausea and vomiting. She last vomited yesterday. She states she vomits mostly after meals but she is able to drink fluids. She also endorses some diarrhea. She's not noticed any blood in her stool. She continues to be congested but feels like this is better than it was 2 weeks ago.  HYPERTENSION Taking meds: Yes     # of doses missed/week: 0 Symptoms: Headache: No Dizziness: No Vision changes: No SOB:  Yes : related to coughing Chest pain: No LE swelling: No Tobacco use: No   ROS: Per HPI  History  Smoking status  . Never Smoker   Smokeless tobacco  . Never Used    O:  Filed Vitals:   09/23/11 0845  BP: 132/87  Pulse: 74  Temp: 98.4 F (36.9 C)    Gen: NAD HEENT: mild pharyngeal erythema w/o exudate, no cervical LAD CV: RRR, no murmur Pulm: CTA bilat,  no wheezes or crackles, normal respiratory effort Abd: soft, NT Ext: Warm, no chronic skin changes, no edema   A/P: 45 y.o. female p/w amenorrhea, cough -See problem list -f/u in 2 weeks if cough continues, otherwise 3 months for BP check

## 2011-09-23 NOTE — Patient Instructions (Signed)
It was great to see you today! I'm glad that her cough is a little bit better. Please come back and see me in the next few weeks if her cough starts to get worse again or does not continue to improve. Your urine pregnancy test was negative! I'm giving you some information on menopause just so you can educate yourself on the changes that will eventually happen to you and your body. I printed you a prescription for birth control. It is $9 at [facility]. You can take it to [facility] to see if they are able to fill it for the same price. Your blood pressure was much better today. Please continue your current medicines. If your cough is better, you can wait to see me for another 3 months and we will see how your blood pressure is then. Otherwise, come back in the next few weeks.      Menopause Menopause is the normal time of life when menstrual periods stop completely. Menopause is complete when you have missed 12 consecutive menstrual periods. It usually occurs between the ages of 60 to 27, with an average age of 34. Very rarely does a woman develop menopause before 45 years old. At menopause, your ovaries stop producing the female hormones, estrogen and progesterone. This can cause undesirable symptoms and also affect your health. Sometimes the symptoms may occur 4 to 5 years before the menopause begins. There is no relationship between menopause and:  Oral contraceptives.   Number of children you had.   Race.   The age your menstrual periods started (menarche).  Heavy smokers and very thin women may develop menopause earlier in life. CAUSES  The ovaries stop producing the female hormones estrogen and progesterone.   Other causes include:   Surgery to remove both ovaries.   The ovaries stop functioning for no known reason.   Tumors of the pituitary gland in the brain.   Medical disease that affects the ovaries and hormone production.   Radiation treatment to the abdomen or pelvis.    Chemotherapy that affects the ovaries.  SYMPTOMS   Hot flashes.   Night sweats.   Decrease in sex drive.   Vaginal dryness and thinning of the vagina causing painful intercourse.   Dryness of the skin and developing wrinkles.   Headaches.   Tiredness.   Irritability.   Memory problems.   Weight gain.   Bladder infections.   Hair growth of the face and chest.   Infertility.  More serious symptoms include:  Loss of bone (osteoporosis) causing breaks (fractures).   Depression.   Hardening and narrowing of the arteries (atherosclerosis) causing heart attacks and strokes.  DIAGNOSIS   When the menstrual periods have stopped for 12 straight months.   Physical exam.   Hormone studies of the blood.  TREATMENT  There are many treatment choices and nearly as many questions about them. The decisions to treat or not to treat menopausal changes is an individual choice made with your caregiver. Your caregiver can discuss the treatments with you. Together, you can decide which treatment will work best for you. Your treatment choices may include:   Hormone therapy (estorgen and progesterone).   Non-hormonal medications.   Treating the individual symptoms with medication (for example antidepressants for depression).   Herbal medications that may help specific symptoms.   Counseling by a psychiatrist or psychologist.   Group therapy.   Lifestyle changes including:   Eating healthy.   Regular exercise.   Limiting caffeine and  alcohol.   Stress management and meditation.   No treatment.  HOME CARE INSTRUCTIONS   Take the medication your caregiver gives you as directed.   Get plenty of sleep and rest.   Exercise regularly.   Eat a diet that contains calcium (good for the bones) and soy products (acts like estrogen hormone).   Avoid alcoholic beverages.   Do not smoke.   If you have hot flashes, dress in layers.   Take supplements, calcium and vitamin  D to strengthen bones.   You can use over-the-counter lubricants or moisturizers for vaginal dryness.   Group therapy is sometimes very helpful.   Acupuncture may be helpful in some cases.  SEEK MEDICAL CARE IF:   You are not sure you are in menopause.   You are having menopausal symptoms and need advice and treatment.   You are still having menstrual periods after age 6.   You have pain with intercourse.   Menopause is complete (no menstrual period for 12 months) and you develop vaginal bleeding.   You need a referral to a specialist (gynecologist, psychiatrist or psychologist) for treatment.  SEEK IMMEDIATE MEDICAL CARE IF:   You have severe depression.   You have excessive vaginal bleeding.   You fell and think you have a broken bone.   You have pain when you urinate.   You develop leg or chest pain.   You have a fast pounding heart beat (palpitations).   You have severe headaches.   You develop vision problems.   You feel a lump in your breast.   You have abdominal pain or severe indigestion.  Document Released: 12/26/2003 Document Revised: 06/17/2011 Document Reviewed: 08/02/2008 [facility] [employer] [facility] Patient Information 2012 [place], [place].

## 2011-09-29 ENCOUNTER — Emergency Department (INDEPENDENT_AMBULATORY_CARE_PROVIDER_SITE_OTHER)
Admission: EM | Admit: 2011-09-29 | Discharge: 2011-09-29 | Disposition: A | Discharge status: Home / Self Care | Payer: Self-pay | Source: Home / Self Care | Attending: Family Medicine | Admitting: Family Medicine

## 2011-09-29 ENCOUNTER — Encounter (HOSPITAL_COMMUNITY): Payer: Self-pay | Admitting: Emergency Medicine

## 2011-09-29 DIAGNOSIS — J45901 Unspecified asthma with (acute) exacerbation: Secondary | ICD-10-CM

## 2011-09-29 HISTORY — DX: Essential (primary) hypertension: I10

## 2011-09-29 MED ORDER — IPRATROPIUM BROMIDE 0.02 % IN SOLN
0.5000 mg | RESPIRATORY_TRACT | Status: DC
  Administered 2011-09-29: 0.5 mg via RESPIRATORY_TRACT

## 2011-09-29 MED ORDER — ALBUTEROL SULFATE HFA 108 (90 BASE) MCG/ACT IN AERS: 2.0000 | INHALATION_SPRAY | RESPIRATORY_TRACT | Status: DC | PRN

## 2011-09-29 MED ORDER — ALBUTEROL SULFATE (5 MG/ML) 0.5% IN NEBU
INHALATION_SOLUTION | RESPIRATORY_TRACT | Status: AC
  Filled 2011-09-29: qty 1

## 2011-09-29 MED ORDER — ALBUTEROL SULFATE (5 MG/ML) 0.5% IN NEBU
5.0000 mg | INHALATION_SOLUTION | Freq: Once | RESPIRATORY_TRACT | Status: AC
  Administered 2011-09-29: 5 mg via RESPIRATORY_TRACT

## 2011-09-29 NOTE — ED Notes (Signed)
Onset 2 weeks ago of being around others smoking cigarettes.  Denies runny nose or other cold symptoms.  States "getting over one".  Patient feels breathing has not been right for 2 weeks.  Patient reports wheezing

## 2011-09-29 NOTE — ED Notes (Signed)
Reports recent preg test negative--home test performed last Wednesday and was negative--09/21/2009
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
## 2011-09-29 NOTE — ED Notes (Signed)
Denies any requests at this time

## 2011-10-01 ENCOUNTER — Encounter: Payer: Self-pay | Admitting: Family Medicine

## 2011-10-01 ENCOUNTER — Ambulatory Visit (INDEPENDENT_AMBULATORY_CARE_PROVIDER_SITE_OTHER): Payer: Self-pay | Admitting: Family Medicine

## 2011-10-01 ENCOUNTER — Telehealth: Payer: Self-pay | Admitting: Family Medicine

## 2011-10-01 DIAGNOSIS — Z20828 Contact with and (suspected) exposure to other viral communicable diseases: Secondary | ICD-10-CM

## 2011-10-01 DIAGNOSIS — N898 Other specified noninflammatory disorders of vagina: Secondary | ICD-10-CM

## 2011-10-01 DIAGNOSIS — Z202 Contact with and (suspected) exposure to infections with a predominantly sexual mode of transmission: Secondary | ICD-10-CM

## 2011-10-01 DIAGNOSIS — N760 Acute vaginitis: Secondary | ICD-10-CM

## 2011-10-01 LAB — POCT WET PREP (WET MOUNT)

## 2011-10-01 MED ORDER — CLINDAMYCIN PHOSPHATE 2 % VA CREA: 1.0000 | TOPICAL_CREAM | Freq: Every day | VAGINAL | Status: AC

## 2011-10-01 NOTE — Assessment & Plan Note (Signed)
Discussed safe sex.  Pt with negative pregnancy test 1 week ago  GC/Chlamydia and wet prep done today, will call patient with results.  She is coming in for a blood draw in a few weeks, will add on HIV/RPR.

## 2011-10-01 NOTE — Patient Instructions (Signed)
It was nice to meet you, we will call you with the results of your lab tests.

## 2011-10-01 NOTE — Progress Notes (Signed)
Patient ID: VANDELLA ORD, female   DOB: 1965/11/08, 45 y.o.   MRN: [id number] Subjective:     Sara Day is a 45 y.o. female who presents for evaluation of an abnormal vaginal discharge. Symptoms have been present for 3 days. Vaginal symptoms: discharge described as white. Contraception: OCP (estrogen/progesterone). She denies blisters, burning, lesions and local irritation Sexually transmitted infection risk: possible STD exposure. Patient has one partner, but they do not use condoms.  Because of the discharge she wanted to be checked for STD's. Menstrual flow: irregular.  Patient has not had a period since 08/02/11, but had a negative U preg in office on 09/23/11.    The following portions of the patient's history were reviewed and updated as appropriate: allergies, current medications, past family history, past medical history, past social history, past surgical history and problem list.   Review of Systems Pertinent items are noted in HPI.    Objective:    BP 127/85  Pulse 73  Temp(Src) 97.9 F (36.6 C) (Oral)  Ht 5\' 1"  (1.549 m)  Wt 130 lb 6.4 oz (59.149 kg)  BMI 24.64 kg/m2  LMP 07/30/2011 General appearance: alert, cooperative and no distress Head: Normocephalic, without obvious abnormality, atraumatic Eyes: EOMIT Pelvic: cervix normal in appearance, external genitalia normal, no adnexal masses or tenderness, no cervical motion tenderness, rectovaginal septum normal, uterus normal size, shape, and consistency and Vagina normal with mild white discharge.  Extremities: extremities normal, atraumatic, no cyanosis or edema    Assessment:    Vaginitis   Plan:    Discussed safe sex. Pt with negative pregnancy test 1 week ago  GC/Chlamydia and wet prep done today, will call patient with results. She is coming in for a blood draw in a few weeks, will add on HIV/RPR.

## 2011-10-01 NOTE — Telephone Encounter (Signed)
Called pt left message, wet prep showed BV.  She had it twice last summer, failed conventional treatment, will Rx Clindamycin vaginal suppository qhs x 7 days. Pt instructed to call with questions.

## 2011-10-02 LAB — GC/CHLAMYDIA PROBE AMP, GENITAL: GC Probe Amp, Genital: NEGATIVE

## 2011-10-07 ENCOUNTER — Encounter: Payer: Self-pay | Admitting: Family Medicine

## 2011-12-07 ENCOUNTER — Ambulatory Visit (INDEPENDENT_AMBULATORY_CARE_PROVIDER_SITE_OTHER): Payer: Self-pay | Admitting: Family Medicine

## 2011-12-07 ENCOUNTER — Encounter: Payer: Self-pay | Admitting: Family Medicine

## 2011-12-07 VITALS — BP 126/88 | HR 85 | Temp 98.1°F | Ht 61.0 in | Wt 129.0 lb

## 2011-12-07 DIAGNOSIS — A084 Viral intestinal infection, unspecified: Secondary | ICD-10-CM | POA: Insufficient documentation

## 2011-12-07 DIAGNOSIS — A09 Infectious gastroenteritis and colitis, unspecified: Secondary | ICD-10-CM

## 2011-12-07 MED ORDER — PROMETHAZINE HCL 12.5 MG PO TABS: 12.5000 mg | ORAL_TABLET | Freq: Three times a day (TID) | ORAL | Status: AC | PRN

## 2011-12-07 NOTE — Progress Notes (Signed)
Sara Day is a 46 y.o. female who presents to [facility] today for vomiting diarrhea and abdominal pain.  This started yesterday and is improving today. She notes mid abdominal pain associated with some vomiting and diarrhea. She is able to eat and drink and keep fluids down.  She denies any blood in her vomit or diarrhea. Additionally she denies any dysuria urgency frequency vaginal discharge or bleeding. Additionally she notes runny nose and cough but denies any fever. 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     PMH reviewed. Significant for hypertension. Very rare alcohol use No history of abdominal surgery. ROS as above otherwise neg Medications reviewed. Current Outpatient Prescriptions  Medication Sig Dispense Refill  . albuterol (PROVENTIL HFA) 108 (90 BASE) MCG/ACT inhaler Inhale 2 puffs into the lungs every 4 (four) hours as needed for wheezing. Inhale 2 puff using inhaler every four hours  6.7 g  2  . hydrochlorothiazide (HYDRODIURIL) 12.5 MG tablet Take 1 tablet (12.5 mg total) by mouth daily.  30 tablet  2  . norgestimate-ethinyl estradiol (SPRINTEC 28) 0.25-35 MG-MCG tablet Take 1 tablet by mouth daily.  1 Package  11  . omeprazole (PRILOSEC) 20 MG capsule Take 1 capsule (20 mg total) by mouth daily.  30 capsule  2  . promethazine (PHENERGAN) 12.5 MG tablet Take 1 tablet (12.5 mg total) by mouth every 8 (eight) hours as needed for nausea.  20 tablet  0    Exam:  BP 126/88  Pulse 85  Temp(Src) 98.1 F (36.7 C) (Oral)  Ht 5\' 1"  (1.549 m)  Wt 129 lb (58.514 kg)  BMI 24.37 kg/m2 Gen: Well NAD HEENT: EOMI,  MMM,  Lungs: CTABL Nl WOB Heart: RRR no MRG Abd: NABS,  ND, mildly tender midabdomen without any masses or hernias rebound or guarding.  Exts: Non edematous BL  LE, warm and well perfused.

## 2011-12-07 NOTE — Assessment & Plan Note (Signed)
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 Additionally her symptoms are improving which is not characteristic of serious abdominal etiology such as appendicitis, bowel obstruction.  Plan for watchful waiting and symptom treatment with Phenergan.  Discussed warning signs that would prompt return to health care such as worsening vomiting or pain. Patient expresses understanding.

## 2011-12-07 NOTE — Patient Instructions (Signed)
Thank you for coming in today. I think you have a virus that is making you sick. Serious bad things like appendicitis usually don't get better. If you continue to have pain vomiting and diarrhea that is getting worse please come back. If you start having a high fever and uncontrollable vomiting go to the emergency room.  Used promethazine (Phenergan) as needed for vomiting. It will make you sleepy.  Viral Gastroenteritis Viral gastroenteritis is also known as stomach flu. This condition affects the stomach and intestinal tract. The illness typically lasts 3 to 8 days. Most people develop an immune response. This eventually gets rid of the virus. While this natural response develops, the virus can make you quite ill.   CAUSES   Diarrhea and vomiting are often caused by a virus. Medicines (antibiotics) that kill germs will not help unless there is also a germ (bacterial) infection. SYMPTOMS   The most common symptom is diarrhea. This can cause severe loss of fluids (dehydration) and body salt (electrolyte) imbalance. TREATMENT   Treatments for this illness are aimed at rehydration. Antidiarrheal medicines are not recommended. They do not decrease diarrhea volume and may be harmful. Usually, home treatment is all that is needed. The most serious cases involve vomiting so severely that you are not able to keep down fluids taken by mouth (orally). In these cases, intravenous (IV) fluids are needed. Vomiting with viral gastroenteritis is common, but it will usually go away with treatment. HOME CARE INSTRUCTIONS   Small amounts of fluids should be taken frequently. Large amounts at one time may not be tolerated. Plain water may be harmful in infants and the elderly. Oral rehydration solutions (ORS) are available at pharmacies and grocery stores. ORS replace water and important electrolytes in proper proportions. Sports drinks are not as effective as ORS and may be harmful due to sugars worsening  diarrhea.  As a general guideline for children, replace any new fluid losses from diarrhea or vomiting with ORS as follows:     If your child weighs 22 pounds or under (10 kg or less), give 60-120 mL (1/4 - 1/2 cup or 2 - 4 ounces) of ORS for each diarrheal stool or vomiting episode.     If your child weighs more than 22 pounds (more than 10 kgs), give 120-240 mL (1/2 - 1 cup or 4 - 8 ounces) of ORS for each diarrheal stool or vomiting episode.     In a child with vomiting, it may be helpful to give the above ORS replacement in 5 mL (1 teaspoon) amounts every 5 minutes, then increase as tolerated.     While correcting for dehydration, children should eat normally. However, foods high in sugar should be avoided because this may worsen diarrhea. Large amounts of carbonated soft drinks, juice, gelatin desserts, and other highly sugared drinks should be avoided.     After correction of dehydration, other liquids that are appealing to the child may be added. Children should drink small amounts of fluids frequently and fluids should be increased as tolerated.     Adults should eat normally while drinking more fluids than usual. Drink small amounts of fluids frequently and increase as tolerated. Drink enough water and fluids to keep your urine clear or pale yellow. Broths, weak decaffeinated tea, lemon-lime soft drinks (allowed to go flat), and ORS replace fluids and electrolytes.     Avoid:    Carbonated drinks.     Juice.    Extremely hot or cold fluids.  Caffeine drinks.     Fatty, greasy foods.     Alcohol.    Tobacco.    Too much intake of anything at one time.     Gelatin desserts.     Probiotics are active cultures of beneficial bacteria. They may lessen the amount and number of diarrheal stools in adults. Probiotics can be found in yogurt with active cultures and in supplements.     Wash your hands well to avoid spreading bacteria and viruses.     Antidiarrheal medicines  are not recommended for infants and children.     Only take over-the-counter or prescription medicines for pain, discomfort, or fever as directed by your caregiver. Do not give aspirin to children.     For adults with dehydration, ask your caregiver if you should continue all prescribed and over-the-counter medicines.     If your caregiver has given you a follow-up appointment, it is very important to keep that appointment. Not keeping the appointment could result in a lasting (chronic) or permanent injury and disability. If there is any problem keeping the appointment, you must call to reschedule.  SEEK IMMEDIATE MEDICAL CARE IF:    You are unable to keep fluids down.     There is no urine output in 6 to 8 hours or there is only a small amount of very dark urine.     You develop shortness of breath.     There is blood in the vomit (may look like coffee grounds) or stool.     Belly (abdominal) pain develops, increases, or localizes.     There is persistent vomiting or diarrhea.     You have a fever.     Your baby is older than 3 months with a rectal temperature of 102 F (38.9 C) or higher.     Your baby is 61 months old or younger with a rectal temperature of 100.4 F (38 C) or higher.  MAKE SURE YOU:    Understand these instructions.     Will watch your condition.     Will get help right away if you are not doing well or get worse.  Document Released: 10/05/2005 Document Revised: 06/17/2011 Document Reviewed: 02/16/2007 [facility] Patient Information 2012 [place], [place].

## 2012-01-21 ENCOUNTER — Encounter (HOSPITAL_COMMUNITY): Payer: Self-pay | Admitting: Emergency Medicine

## 2012-01-21 ENCOUNTER — Emergency Department (INDEPENDENT_AMBULATORY_CARE_PROVIDER_SITE_OTHER): Payer: Self-pay

## 2012-01-21 ENCOUNTER — Emergency Department (HOSPITAL_COMMUNITY)
Admission: EM | Admit: 2012-01-21 | Discharge: 2012-01-21 | Disposition: A | Discharge status: Home / Self Care | Payer: Self-pay | Source: Home / Self Care | Attending: Family Medicine | Admitting: Family Medicine

## 2012-01-21 DIAGNOSIS — J45901 Unspecified asthma with (acute) exacerbation: Secondary | ICD-10-CM

## 2012-01-21 MED ORDER — METHYLPREDNISOLONE 4 MG PO KIT: PACK | ORAL | Status: AC

## 2012-01-21 MED ORDER — IPRATROPIUM BROMIDE 0.02 % IN SOLN
0.5000 mg | Freq: Once | RESPIRATORY_TRACT | Status: AC
  Administered 2012-01-21: 0.5 mg via RESPIRATORY_TRACT

## 2012-01-21 MED ORDER — METHYLPREDNISOLONE SODIUM SUCC 125 MG IJ SOLR
125.0000 mg | Freq: Once | INTRAMUSCULAR | Status: AC
  Administered 2012-01-21: 125 mg via INTRAMUSCULAR

## 2012-01-21 MED ORDER — ALBUTEROL SULFATE (5 MG/ML) 0.5% IN NEBU
INHALATION_SOLUTION | RESPIRATORY_TRACT | Status: AC
  Filled 2012-01-21: qty 1

## 2012-01-21 MED ORDER — ALBUTEROL SULFATE (5 MG/ML) 0.5% IN NEBU
5.0000 mg | INHALATION_SOLUTION | Freq: Once | RESPIRATORY_TRACT | Status: AC
  Administered 2012-01-21: 5 mg via RESPIRATORY_TRACT

## 2012-01-21 MED ORDER — METHYLPREDNISOLONE SODIUM SUCC 125 MG IJ SOLR
INTRAMUSCULAR | Status: AC
  Filled 2012-01-21: qty 2

## 2012-01-21 NOTE — ED Provider Notes (Signed)
History     CSN: [id number]  Arrival date & time 01/21/12  1750   First MD Initiated Contact with Patient 01/21/12 1756      Chief Complaint  Patient presents with  . Asthma    (Consider location/radiation/quality/duration/timing/severity/associated sxs/prior treatment) Patient is a 46 y.o. female presenting with shortness of breath. The history is provided by the patient.  Shortness of Breath  The current episode started more than 2 weeks ago. The onset was gradual. The problem has been gradually worsening (has used 2 ventolin mdi without relief.). The problem is moderate. Associated symptoms include shortness of breath and wheezing.    Past Medical History  Diagnosis Date  . Asthma   . Hypertension     History reviewed. No pertinent past surgical history.  No family history on file.  History  Substance Use Topics  . Smoking status: Never Smoker   . Smokeless tobacco: Never Used  . Alcohol Use: Yes    OB History    Grav Para Term Preterm Abortions TAB SAB Ect Mult Living                  Review of Systems  Constitutional: Negative.   HENT: Negative.   Eyes: Negative.   Respiratory: Positive for shortness of breath and wheezing.   Cardiovascular: Negative.   Gastrointestinal: Negative.     Allergies  Codeine; Metronidazole; and Sulfamethoxazole  Home Medications   Current Outpatient Rx  Name Route Sig Dispense Refill  . ALBUTEROL SULFATE HFA 108 (90 BASE) MCG/ACT IN AERS Inhalation Inhale 2 puffs into the lungs every 4 (four) hours as needed for wheezing. Inhale 2 puff using inhaler every four hours 6.7 g 2  . HYDROCHLOROTHIAZIDE 12.5 MG PO TABS Oral Take 1 tablet (12.5 mg total) by mouth daily. 30 tablet 2  . NORGESTIMATE-ETH ESTRADIOL 0.25-35 MG-MCG PO TABS Oral Take 1 tablet by mouth daily. 1 Package 11  . OMEPRAZOLE 20 MG PO CPDR Oral Take 1 capsule (20 mg total) by mouth daily. 30 capsule 2    BP 161/93  Pulse 70  Temp(Src) 98.5 F (36.9 C)  (Oral)  Resp 22  SpO2 96%  LMP 01/13/2012  Physical Exam  Nursing note and vitals reviewed. Constitutional: She is oriented to person, place, and time. She appears well-developed and well-nourished.  HENT:  Head: Normocephalic.  Mouth/Throat: Oropharynx is clear and moist.  Eyes: Pupils are equal, round, and reactive to light.  Neck: Normal range of motion. Neck supple.  Pulmonary/Chest: She has wheezes.  Neurological: She is alert and oriented to person, place, and time.  Skin: Skin is warm and dry.    ED Course  Procedures (including critical care time)  Labs Reviewed - No data to display No results found.   1. Asthma exacerbation       MDM  X-rays reviewed and report per radiologist.  Sx much  Improved , lungs scattered wheezes, improved.       Linna Hoff, MD 02/13/12 [id number]

## 2012-01-21 NOTE — ED Notes (Addendum)
PT HERE WITH  ASTHMATIC SX SOB,CHEST DISCOMFORT THAT STARTED LAST MNTH. SATS 96% R/A

## 2012-01-21 NOTE — Discharge Instructions (Signed)
Take medicine as prescribed and see your doctor next week to discuss improving your asthma treatment.

## 2012-04-01 ENCOUNTER — Ambulatory Visit (INDEPENDENT_AMBULATORY_CARE_PROVIDER_SITE_OTHER): Payer: Self-pay | Admitting: Family Medicine

## 2012-04-01 ENCOUNTER — Other Ambulatory Visit (HOSPITAL_COMMUNITY)
Admission: RE | Admit: 2012-04-01 | Discharge: 2012-04-01 | Disposition: A | Discharge status: Home / Self Care | Payer: Self-pay | Source: Ambulatory Visit | Attending: Family Medicine | Admitting: Family Medicine

## 2012-04-01 ENCOUNTER — Encounter: Payer: Self-pay | Admitting: Family Medicine

## 2012-04-01 VITALS — BP 135/81 | HR 58 | Temp 98.2°F | Ht 61.0 in | Wt 135.0 lb

## 2012-04-01 DIAGNOSIS — Z113 Encounter for screening for infections with a predominantly sexual mode of transmission: Secondary | ICD-10-CM | POA: Insufficient documentation

## 2012-04-01 DIAGNOSIS — N760 Acute vaginitis: Secondary | ICD-10-CM

## 2012-04-01 DIAGNOSIS — N898 Other specified noninflammatory disorders of vagina: Secondary | ICD-10-CM

## 2012-04-01 DIAGNOSIS — A499 Bacterial infection, unspecified: Secondary | ICD-10-CM

## 2012-04-01 DIAGNOSIS — B9689 Other specified bacterial agents as the cause of diseases classified elsewhere: Secondary | ICD-10-CM

## 2012-04-01 LAB — POCT WET PREP (WET MOUNT)

## 2012-04-01 MED ORDER — CLINDAMYCIN HCL 300 MG PO CAPS: 300.0000 mg | ORAL_CAPSULE | Freq: Two times a day (BID) | ORAL | Status: AC

## 2012-04-01 MED ORDER — OMEPRAZOLE 20 MG PO CPDR: 20.0000 mg | DELAYED_RELEASE_CAPSULE | Freq: Every day | ORAL | Status: DC

## 2012-04-01 NOTE — Patient Instructions (Signed)
Bacterial Vaginosis Bacterial vaginosis (BV) is a vaginal infection where the normal balance of bacteria in the vagina is disrupted. The normal balance is then replaced by an overgrowth of certain bacteria. There are several different kinds of bacteria that can cause BV. BV is the most common vaginal infection in women of childbearing age. CAUSES   The cause of BV is not fully understood. BV develops when there is an increase or imbalance of harmful bacteria.   Some activities or behaviors can upset the normal balance of bacteria in the vagina and put women at increased risk including:   Having a new sex partner or multiple sex partners.   Douching.   Using an intrauterine device (IUD) for contraception.   It is not clear what role sexual activity plays in the development of BV. However, women that have never had sexual intercourse are rarely infected with BV.  Women do not get BV from toilet seats, bedding, swimming pools or from touching objects around them.  SYMPTOMS   Grey vaginal discharge.   A fish-like odor with discharge, especially after sexual intercourse.   Itching or burning of the vagina and vulva.   Burning or pain with urination.   Some women have no signs or symptoms at all.  DIAGNOSIS  Your caregiver must examine the vagina for signs of BV. Your caregiver will perform lab tests and look at the sample of vaginal fluid through a microscope. They will look for bacteria and abnormal cells (clue cells), a pH test higher than 4.5, and a positive amine test all associated with BV.  RISKS AND COMPLICATIONS   Pelvic inflammatory disease (PID).   Infections following gynecology surgery.   Developing HIV.   Developing herpes virus.  TREATMENT  Sometimes BV will clear up without treatment. However, all women with symptoms of BV should be treated to avoid complications, especially if gynecology surgery is planned. Female partners generally do not need to be treated. However,  BV may spread between female sex partners so treatment is helpful in preventing a recurrence of BV.   BV may be treated with antibiotics. The antibiotics come in either pill or vaginal cream forms. Either can be used with nonpregnant or pregnant women, but the recommended dosages differ. These antibiotics are not harmful to the baby.   BV can recur after treatment. If this happens, a second round of antibiotics will often be prescribed.   Treatment is important for pregnant women. If not treated, BV can cause a premature delivery, especially for a pregnant woman who had a premature birth in the past. All pregnant women who have symptoms of BV should be checked and treated.   For chronic reoccurrence of BV, treatment with a type of prescribed gel vaginally twice a week is helpful.  HOME CARE INSTRUCTIONS   Finish all medication as directed by your caregiver.   Do not have sex until treatment is completed.   Tell your sexual partner that you have a vaginal infection. They should see their caregiver and be treated if they have problems, such as a mild rash or itching.   Practice safe sex. Use condoms. Only have 1 sex partner.  PREVENTION  Basic prevention steps can help reduce the risk of upsetting the natural balance of bacteria in the vagina and developing BV:  Do not have sexual intercourse (be abstinent).   Do not douche.   Use all of the medicine prescribed for treatment of BV, even if the signs and symptoms go away.     Tell your sex partner if you have BV. That way, they can be treated, if needed, to prevent reoccurrence.  SEEK MEDICAL CARE IF:   Your symptoms are not improving after 3 days of treatment.   You have increased discharge, pain, or fever.  MAKE SURE YOU:   Understand these instructions.   Will watch your condition.   Will get help right away if you are not doing well or get worse.  FOR MORE INFORMATION  Division of STD Prevention (DSTDP), Centers for Disease  Control and Prevention: www.cdc.gov/std American Social Health Association (ASHA): www.ashastd.org  Document Released: 10/05/2005 Document Revised: 09/24/2011 Document Reviewed: 03/28/2009 [employer] Patient Information 2012 [employer]. 

## 2012-04-05 NOTE — Assessment & Plan Note (Signed)
Vaginal discharge consistent with BV.  Allergy to flagyl.  Will treat with clindamycin.  GC/Chlamydia collected as well, doubt PID given benign abdominal exam and absence of CMT.

## 2012-04-05 NOTE — Progress Notes (Signed)
  Subjective:    Patient ID: Sara Day, female    DOB: Dec 05, 1965, 46 y.o.   MRN: [id number]  HPI  1. Abdominal pain/Vaginal discharge:  Here with complaint of lower abdominal pain x3-4 days.  This has been associated with vaginal discharge.  Vaginal discharge described as thick, white-yellow in color.  Has noticed odor to discharge.  Denies bleeding.   She denies dysuria, urinary frequency/urgency.  No flank pain.     Review of Systems Per HPI    Objective:   Physical Exam  Constitutional: She appears well-nourished. No distress.  Abdominal: Soft. Bowel sounds are normal. She exhibits no distension. There is no tenderness. There is no guarding.  Genitourinary: Uterus normal. Cervix exhibits no motion tenderness and no friability. Right adnexum displays no tenderness and no fullness. Left adnexum displays no tenderness and no fullness. No tenderness or bleeding around the vagina. Vaginal discharge found.   Dennison Nancy, RN present during pelvic exam       Assessment & Plan:

## 2012-06-19 ENCOUNTER — Other Ambulatory Visit: Payer: Self-pay | Admitting: Family Medicine

## 2012-06-21 NOTE — Telephone Encounter (Signed)
PATIENT NEEDS APPT WITH PCP FOR FURTHER REFILLS 

## 2012-06-29 ENCOUNTER — Ambulatory Visit (INDEPENDENT_AMBULATORY_CARE_PROVIDER_SITE_OTHER): Payer: Self-pay | Admitting: Family Medicine

## 2012-06-29 ENCOUNTER — Encounter: Payer: Self-pay | Admitting: Family Medicine

## 2012-06-29 ENCOUNTER — Other Ambulatory Visit (HOSPITAL_COMMUNITY)
Admission: RE | Admit: 2012-06-29 | Discharge: 2012-06-29 | Disposition: A | Discharge status: Home / Self Care | Payer: Self-pay | Source: Ambulatory Visit | Attending: Family Medicine | Admitting: Family Medicine

## 2012-06-29 VITALS — BP 139/82 | HR 59 | Temp 98.7°F | Ht 61.0 in | Wt 133.0 lb

## 2012-06-29 DIAGNOSIS — N760 Acute vaginitis: Secondary | ICD-10-CM | POA: Insufficient documentation

## 2012-06-29 DIAGNOSIS — Z113 Encounter for screening for infections with a predominantly sexual mode of transmission: Secondary | ICD-10-CM | POA: Insufficient documentation

## 2012-06-29 LAB — POCT WET PREP (WET MOUNT)

## 2012-06-29 NOTE — Assessment & Plan Note (Signed)
With possible STD exposure.  GC/Chlamydia, and wet prep done today.  Reviewed safe sex practices. Patient voices understanding.

## 2012-06-29 NOTE — Patient Instructions (Signed)
Thank you for coming in today.  The lab tests will take 1-2 days to come back.  I will call you with the results when I receive them back from the lab.

## 2012-06-29 NOTE — Progress Notes (Signed)
  Subjective:    Patient ID: Sara Day, female    DOB: 19-Sep-1966, 46 y.o.   MRN: [id number]  HPI  Sara Day comes in due to some vaginal discharge, pelvic discomfort, and nausea.  She says that about a month ago she was having intercourse and the condom broke.  She is taking her birth control pills.  However, she says she is concerned that she may have an STI causing these symptoms.   Review of Systems See HPI    Objective:   Physical Exam BP 139/82  Pulse 59  Temp 98.7 F (37.1 C) (Oral)  Ht 5\' 1"  (1.549 m)  Wt 133 lb (60.328 kg)  BMI 25.13 kg/m2  LMP 06/29/2012 General appearance: alert, cooperative and no distress Pelvic: cervix normal in appearance, external genitalia normal, no adnexal masses or tenderness, no cervical motion tenderness, rectovaginal septum normal, uterus normal size, shape, and consistency and Vagina normal with thin white discharge.        Assessment & Plan:

## 2012-07-01 ENCOUNTER — Ambulatory Visit (INDEPENDENT_AMBULATORY_CARE_PROVIDER_SITE_OTHER): Payer: Self-pay | Admitting: *Deleted

## 2012-07-01 ENCOUNTER — Telehealth: Payer: Self-pay | Admitting: Family Medicine

## 2012-07-01 DIAGNOSIS — A5400 Gonococcal infection of lower genitourinary tract, unspecified: Secondary | ICD-10-CM

## 2012-07-01 DIAGNOSIS — A749 Chlamydial infection, unspecified: Secondary | ICD-10-CM

## 2012-07-01 DIAGNOSIS — A549 Gonococcal infection, unspecified: Secondary | ICD-10-CM

## 2012-07-01 MED ORDER — CEFTRIAXONE SODIUM 250 MG IJ SOLR
250.0000 mg | Freq: Once | INTRAMUSCULAR | Status: AC
  Administered 2012-07-01: 250 mg via INTRAMUSCULAR

## 2012-07-01 MED ORDER — AZITHROMYCIN 1 G PO PACK
1.0000 g | PACK | Freq: Once | ORAL | Status: AC
  Administered 2012-07-01: 1 g via ORAL

## 2012-07-01 NOTE — Progress Notes (Signed)
Patient advised to notify partner. Advised to abstain from sex for 7 days.  Patient waited in office 20 minutes following injection without complications.

## 2012-07-01 NOTE — Telephone Encounter (Signed)
LMOVM for pt to call back.  Fleeger, Jessica Dawn  

## 2012-07-01 NOTE — Telephone Encounter (Addendum)
Please notify patient + gonorrhea test from the other day.  Needs come in to for nurse visit for 1G PO azithromycin x1, 250 mg IM Ceftriaxone x1.  Thanks.

## 2012-07-01 NOTE — Telephone Encounter (Signed)
Spoke with patient and informed her of positive results, she will be in today for treatment.

## 2012-08-05 ENCOUNTER — Encounter: Payer: Self-pay | Admitting: Family Medicine

## 2012-08-05 ENCOUNTER — Ambulatory Visit (INDEPENDENT_AMBULATORY_CARE_PROVIDER_SITE_OTHER): Payer: Self-pay | Admitting: Family Medicine

## 2012-08-05 ENCOUNTER — Other Ambulatory Visit (HOSPITAL_COMMUNITY)
Admission: RE | Admit: 2012-08-05 | Discharge: 2012-08-05 | Disposition: A | Discharge status: Home / Self Care | Payer: Self-pay | Source: Ambulatory Visit | Attending: Family Medicine | Admitting: Family Medicine

## 2012-08-05 VITALS — BP 147/82 | HR 88 | Wt 133.0 lb

## 2012-08-05 DIAGNOSIS — Z124 Encounter for screening for malignant neoplasm of cervix: Secondary | ICD-10-CM

## 2012-08-05 DIAGNOSIS — Z01419 Encounter for gynecological examination (general) (routine) without abnormal findings: Secondary | ICD-10-CM | POA: Insufficient documentation

## 2012-08-05 DIAGNOSIS — N760 Acute vaginitis: Secondary | ICD-10-CM

## 2012-08-05 DIAGNOSIS — Z209 Contact with and (suspected) exposure to unspecified communicable disease: Secondary | ICD-10-CM

## 2012-08-05 DIAGNOSIS — Z113 Encounter for screening for infections with a predominantly sexual mode of transmission: Secondary | ICD-10-CM | POA: Insufficient documentation

## 2012-08-05 LAB — POCT WET PREP (WET MOUNT)

## 2012-08-05 NOTE — Patient Instructions (Addendum)
Will call with results by Monday. Your partner needs to be treated. Always use condoms.

## 2012-08-08 DIAGNOSIS — Z209 Contact with and (suspected) exposure to unspecified communicable disease: Secondary | ICD-10-CM | POA: Insufficient documentation

## 2012-08-08 NOTE — Progress Notes (Signed)
  Subjective:    Patient ID: Sara Day, female    DOB: [birth year], 46 y.o.   MRN: [id number]  HPI  1. STD check. The patient desires to be checked for STDs. She recently discovered that her boyfriend she is on her period she is concerned because last month was treated for gonorrhea. She states that he may or may not have been treated, concerned he lied to her. Patient denies any abnormal vaginal discharge, pelvic pain, abdominal pain, dysuria, atypical bleeding, fever, chills.  Review of Systems See HPI otherwise negative.  reports that she has never smoked. She has never used smokeless tobacco.     Objective:   Physical Exam  Vitals reviewed. Constitutional: She is oriented to person, place, and time. She appears well-developed and well-nourished. No distress.  HENT:  Head: Normocephalic and atraumatic.  Pulmonary/Chest: Effort normal.  Abdominal: Soft. Bowel sounds are normal. She exhibits no distension. There is no tenderness. There is no rebound.  Genitourinary: Vagina normal and uterus normal.       No pelvic tenderness. No masses palpated. Mucosa wnl. Very mild white vag discharge.  No LAD or outer lesions noted.  Musculoskeletal: She exhibits no edema.  Neurological: She is alert and oriented to person, place, and time.  Skin: No rash noted. She is not diaphoretic.  Psychiatric: She has a normal mood and affect.       Assessment & Plan:

## 2012-08-08 NOTE — Assessment & Plan Note (Addendum)
Patient is high risk for re-exposure to gonorrhea. Will test for cure or re-exposure today. Check Chlamydia sure as well and wet prep. Patient is asymptomatic with no signs of PID. Advised patient that she must use condoms to prevent STD in the future. Advised that her partner must get treated.

## 2012-08-09 ENCOUNTER — Telehealth: Payer: Self-pay | Admitting: Family Medicine

## 2012-08-09 NOTE — Telephone Encounter (Signed)
Fwd. To Dr.Konkol

## 2012-08-09 NOTE — Telephone Encounter (Signed)
Pt is asking for results of her labs the other day

## 2012-08-10 ENCOUNTER — Other Ambulatory Visit: Payer: Self-pay | Admitting: Family Medicine

## 2012-08-10 ENCOUNTER — Encounter: Payer: Self-pay | Admitting: Family Medicine

## 2012-08-10 MED ORDER — METRONIDAZOLE 0.75 % VA GEL: 1.0000 | Freq: Every day | VAGINAL | Status: DC

## 2012-08-10 NOTE — Telephone Encounter (Signed)
Please inform pt. All negative except for she has BV. She lists metronidazole as an allergy causing rash. I sent in metro-gel. If she cannot tolerate the topical therapy either, please let me know and will try something else.

## 2012-08-10 NOTE — Telephone Encounter (Signed)
Advised pt of BV and med at pharmacy.

## 2012-08-31 ENCOUNTER — Other Ambulatory Visit: Payer: Self-pay | Admitting: Family Medicine

## 2012-09-10 ENCOUNTER — Encounter (HOSPITAL_COMMUNITY): Payer: Self-pay | Admitting: Emergency Medicine

## 2012-09-10 ENCOUNTER — Emergency Department (INDEPENDENT_AMBULATORY_CARE_PROVIDER_SITE_OTHER)
Admission: EM | Admit: 2012-09-10 | Discharge: 2012-09-10 | Disposition: A | Discharge status: Home / Self Care | Payer: Self-pay | Source: Home / Self Care | Attending: Family Medicine | Admitting: Family Medicine

## 2012-09-10 DIAGNOSIS — H16202 Unspecified keratoconjunctivitis, left eye: Secondary | ICD-10-CM

## 2012-09-10 DIAGNOSIS — H16209 Unspecified keratoconjunctivitis, unspecified eye: Secondary | ICD-10-CM

## 2012-09-10 MED ORDER — TOBRAMYCIN 0.3 % OP SOLN: 1.0000 [drp] | Freq: Four times a day (QID) | OPHTHALMIC | Status: DC

## 2012-09-10 NOTE — ED Notes (Signed)
Pt c/o poss conjunctivitis since Thursday... 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 Sx include: itching and burning of left eye... Denies: fevers, vomiting, nauseas, diarrhea, blurry vision... Pt is alert w/no signs of distress.

## 2012-09-10 NOTE — ED Provider Notes (Signed)
History     CSN: [id number]  Arrival date & time 09/10/12  1027   First MD Initiated Contact with Patient 09/10/12 1036      Chief Complaint  Patient presents with  . Conjunctivitis    (Consider location/radiation/quality/duration/timing/severity/associated sxs/prior treatment) Patient is a 46 y.o. female presenting with conjunctivitis. The history is provided by the patient.  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 The problem has been gradually worsening. The problem is mild. Associated symptoms include eye discharge and eye redness. Pertinent negatives include no photophobia and no eye pain.    Past Medical History  Diagnosis Date  . Asthma   . Hypertension     History reviewed. No pertinent past surgical history.  No family history on file.  History  Substance Use Topics  . Smoking status: Never Smoker   . Smokeless tobacco: Never Used  . Alcohol Use: Yes    OB History    Grav Para Term Preterm Abortions TAB SAB Ect Mult Living                  Review of Systems  Constitutional: Negative.   HENT: Negative.   Eyes: Positive for discharge and redness. Negative for photophobia and pain.    Allergies  Codeine; Metronidazole; and Sulfamethoxazole  Home Medications   Current Outpatient Rx  Name  Route  Sig  Dispense  Refill  . ALBUTEROL SULFATE HFA 108 (90 BASE) MCG/ACT IN AERS   Inhalation   Inhale 2 puffs into the lungs every 4 (four) hours as needed for wheezing. Inhale 2 puff using inhaler every four hours   6.7 g   2   . HYDROCHLOROTHIAZIDE 12.5 MG PO TABS      TAKE 1 TABLET BY MOUTH DAILY   30 tablet   1     PATIENT NEEDS APPT WITH PCP FOR FURTHER REFILLS   . METRONIDAZOLE 0.75 % VA GEL   Vaginal   Place 1 Applicatorful vaginally daily. Use for 5 nights.   70 g   0   . MONONESSA 0.25-35 MG-MCG PO TABS      TAKE 1 TABLET BY MOUTH DAILY   90 tablet   1     **Patient  requests 90 days supply**   . OMEPRAZOLE 20 MG PO CPDR   Oral   Take 1 capsule (20 mg total) by mouth daily.   30 capsule   2   . TOBRAMYCIN SULFATE 0.3 % OP SOLN   Left Eye   Place 1 drop into the left eye every 6 (six) hours. After warm soak to eye each time.   5 mL   0     BP 175/85  Pulse 55  Temp 98.7 F (37.1 C) (Oral)  Resp 16  SpO2 100%  LMP 09/10/2012  Physical Exam  Nursing note and vitals reviewed. Constitutional: She appears well-developed and well-nourished.  HENT:  Head: Normocephalic.  Right Ear: External ear normal.  Left Ear: External ear normal.  Mouth/Throat: Oropharynx is clear and moist.  Eyes: EOM are normal. Pupils are equal, round, and reactive to light. Right eye exhibits no discharge. Left eye exhibits discharge. Right conjunctiva is not injected. Right conjunctiva has no hemorrhage. Left conjunctiva is injected. Left conjunctiva has no hemorrhage.  Slit lamp exam:      The left eye shows corneal flare. The left eye shows no corneal abrasion and no fluorescein uptake.  ED Course  Procedures (including critical care time)  Labs Reviewed - No data to display No results found.   1. Keratoconjunctivitis of left eye       MDM          Linna Hoff, MD 09/10/12 1108

## 2012-09-12 ENCOUNTER — Telehealth: Payer: Self-pay | Admitting: Family Medicine

## 2012-09-12 NOTE — Telephone Encounter (Signed)
Left eye pink and swollen.  Was seen yesterday and given tobramycin eye drops.  Was able to use two doses yesterday.  Swelling started today.   Only has itching and denies pain.  Having clear drainage from eye.  "Gets a rash when I use the drops on my face under my eye."  Denies any blisters or shortness of breath.  Patient allergic to sulfa.  Informed patient not to use drops until I discuss with MD.  Patient verbalized understanding.  Note routed to Dr. Fara Boros for advice.  Gaylene Brooks, RN

## 2012-09-12 NOTE — Telephone Encounter (Signed)
Per Dr. Edwena Bunde patient d/c eye drops.  Can use saline eye drops and cool compresses.  If eye not better in two days, instructed to call our office back and Dr. Fara Boros will Rx different eye drops.  Patient agreeable to plan and verbalized understanding.  Gaylene Brooks, RN

## 2012-09-12 NOTE — Telephone Encounter (Signed)
Pt was seen yesterday for pink eye and has put drops in twice and now her eye is swollen

## 2012-09-12 NOTE — Telephone Encounter (Signed)
Unclear if this is allergic rxn to tobramycin eye drops leading to an allergic conjunctivitis or if this is worsening of a viral conjunctivitis (ex adenovirus).  Will have pt stop eye drops and use normal saline and cool wash cloth to help with inflammation.  Should get seen tomorrow (Tuesday) or Wednesday if eye redness and swelling is not improving to re-evaluate for type of conjunctivitis (allergic vs bacterial vs viral)

## 2012-10-06 ENCOUNTER — Telehealth: Payer: Self-pay | Admitting: Family Medicine

## 2012-10-06 NOTE — Telephone Encounter (Signed)
Attempted to call patient, no voice mail. If patient returns call she needs an office visit for any refills.Busick, Rodena Medin

## 2012-10-06 NOTE — Telephone Encounter (Signed)
Patient is calling for a refill on her HCTZ sent to [facility] on [address].

## 2012-11-18 ENCOUNTER — Other Ambulatory Visit (HOSPITAL_COMMUNITY)
Admission: RE | Admit: 2012-11-18 | Discharge: 2012-11-18 | Disposition: A | Discharge status: Home / Self Care | Payer: Self-pay | Source: Ambulatory Visit | Attending: Emergency Medicine | Admitting: Emergency Medicine

## 2012-11-18 ENCOUNTER — Emergency Department (INDEPENDENT_AMBULATORY_CARE_PROVIDER_SITE_OTHER)
Admission: EM | Admit: 2012-11-18 | Discharge: 2012-11-18 | Disposition: A | Discharge status: Home / Self Care | Payer: Self-pay | Source: Home / Self Care | Attending: Emergency Medicine | Admitting: Emergency Medicine

## 2012-11-18 ENCOUNTER — Encounter (HOSPITAL_COMMUNITY): Payer: Self-pay | Admitting: Emergency Medicine

## 2012-11-18 DIAGNOSIS — Z113 Encounter for screening for infections with a predominantly sexual mode of transmission: Secondary | ICD-10-CM | POA: Insufficient documentation

## 2012-11-18 DIAGNOSIS — N760 Acute vaginitis: Secondary | ICD-10-CM | POA: Insufficient documentation

## 2012-11-18 DIAGNOSIS — I10 Essential (primary) hypertension: Secondary | ICD-10-CM

## 2012-11-18 DIAGNOSIS — K2970 Gastritis, unspecified, without bleeding: Secondary | ICD-10-CM

## 2012-11-18 LAB — POCT I-STAT, CHEM 8
BUN: 11 mg/dL (ref 6–23)
Calcium, Ion: 1.2 mmol/L (ref 1.12–1.23)
Hemoglobin: 12.6 g/dL (ref 12.0–15.0)
Sodium: 139 mEq/L (ref 135–145)
TCO2: 24 mmol/L (ref 0–100)

## 2012-11-18 LAB — POCT URINALYSIS DIP (DEVICE)
Glucose, UA: NEGATIVE mg/dL
Leukocytes, UA: NEGATIVE
Nitrite: NEGATIVE
Protein, ur: NEGATIVE mg/dL
Urobilinogen, UA: 0.2 mg/dL (ref 0.0–1.0)

## 2012-11-18 LAB — POCT PREGNANCY, URINE: Preg Test, Ur: NEGATIVE

## 2012-11-18 LAB — POCT H PYLORI SCREEN: H. PYLORI SCREEN, POC: NEGATIVE

## 2012-11-18 MED ORDER — AMOXICILLIN 500 MG PO CAPS: 500.0000 mg | ORAL_CAPSULE | Freq: Three times a day (TID) | ORAL | Status: DC

## 2012-11-18 MED ORDER — OMEPRAZOLE 20 MG PO CPDR: 20.0000 mg | DELAYED_RELEASE_CAPSULE | Freq: Every day | ORAL | Status: DC

## 2012-11-18 MED ORDER — SUCRALFATE 1 G PO TABS: ORAL_TABLET | ORAL | Status: DC

## 2012-11-18 NOTE — ED Notes (Signed)
Pt c/o a thick vaginal discharge. Has concerns of std's. X 2 wks.   Abdominal pain that is located in center of stomach x 2 wks with nausea.   Pt denies fever, vomiting and diarrhea. Denies urinary symptoms.

## 2012-11-18 NOTE — ED Provider Notes (Signed)
Chief Complaint  Patient presents with  . Vaginal Discharge    heavy thick white discharge x 2 wks. concerns for std's  . Abdominal Pain    pain in center of stomach x 2 wks.     History of Present Illness:    Sara Day is a 47 year old female who presents today for several problems: Vaginal discharge, upper abdominal pain, and hypertension.  1. Vaginal discharge: This is been going on for a week. The discharge is white and not itchy or malodorous. The patient has a history of bacterial vaginosis. No history of STDs. She denies any fever, chills, nausea, vomiting, irregular menses, or urinary symptoms.  2. Abdominal pain: The patient has had a two-week history of bilateral upper abdominal pain which comes and goes. Is worse in the morning when she first gets up. It can last for minutes at a time. The pain occurs daily and is not getting better or worse. Is rated a 4-5/10 in intensity. Nothing makes it worse including eating. She's tried some kind of antacids but it didn't seem to help much. She's felt nauseated with this and vomited several times. Her weight is down about 15 pounds but she thinks this might be due to just being sick. She is somewhat constipated. She denies diarrhea, hematochezia, or melena. There is no blood in the vomitus, coffee-ground emesis, or bilious emesis. She denies any history of gastroesophageal reflux, ulcer disease, gastritis.  3. Hypertension: The patient has had hypertension for years. She is on hydrochlorothiazide for that. The patient states her doctor told her to take it only if she gets a headache, so she takes it infrequently. She denies any dizziness, blurry vision, chest pain, or shortness of breath.  Review of Systems:  Other than noted above, the patient denies any of the following symptoms: Constitutional:  No fever, chills, fatigue, weight loss or anorexia. Lungs:  No cough or shortness of breath. Heart:  No chest pain, palpitations, syncope or  edema.  No cardiac history. Abdomen:  No nausea, vomiting, hematememesis, melena, diarrhea, or hematochezia. GU:  No dysuria, frequency, urgency, or hematuria. Gyn:  No vaginal discharge, itching, abnormal bleeding, dyspareunia, or pelvic pain.  PMFSH:  Past medical history, family history, social history, meds, and allergies were reviewed along with nurse's notes.  No prior abdominal surgeries, past history of GI problems, STDs or GYN problems.  No history of aspirin or NSAID use.  No excessive alcohol intake.  Physical Exam:   Vital signs:  BP 130/100  Pulse 64  Temp 98.4 F (36.9 C) (Oral)  Resp 18  SpO2 100%  LMP 10/28/2012 Gen:  Alert, oriented, in no distress. Lungs:  Breath sounds clear and equal bilaterally.  No wheezes, rales or rhonchi. Heart:  Regular rhythm.  No gallops or murmers.   Abdomen:  Soft, flat, nondistended. She has mild, generalized tenderness to palpation of the entire abdomen which was not localized to any one particular area. There was no guarding or rebound, no organomegaly or mass. Bowel sounds are normally active. Pelvic:  Normal external genitalia, vaginal and cervical mucosa were normal, no vaginal discharge, no pain on cervical motion, uterus was midposition, normal in size and shape and nontender, no adnexal masses or tenderness. Skin:  Clear, warm and dry.  No rash.  Labs:   Results for orders placed during the hospital encounter of 11/18/12  POCT H PYLORI SCREEN      Component Value Range   H. PYLORI SCREEN, POC NEGATIVE  NEGATIVE  POCT URINALYSIS DIP (DEVICE)      Component Value Range   Glucose, UA NEGATIVE  NEGATIVE mg/dL   Bilirubin Urine NEGATIVE  NEGATIVE   Ketones, ur NEGATIVE  NEGATIVE mg/dL   Specific Gravity, Urine 1.025  1.005 - 1.030   Hgb urine dipstick NEGATIVE  NEGATIVE   pH 6.5  5.0 - 8.0   Protein, ur NEGATIVE  NEGATIVE mg/dL   Urobilinogen, UA 0.2  0.0 - 1.0 mg/dL   Nitrite NEGATIVE  NEGATIVE   Leukocytes, UA NEGATIVE   NEGATIVE  POCT PREGNANCY, URINE      Component Value Range   Preg Test, Ur NEGATIVE  NEGATIVE  POCT I-STAT, CHEM 8      Component Value Range   Sodium 139  135 - 145 mEq/L   Potassium 3.8  3.5 - 5.1 mEq/L   Chloride 108  96 - 112 mEq/L   BUN 11  6 - 23 mg/dL   Creatinine, Ser 6.96  0.50 - 1.10 mg/dL   Glucose, Bld 88  70 - 99 mg/dL   Calcium, Ion 2.95  1.12 - 1.23 mmol/L   TCO2 24  0 - 100 mmol/L   Hemoglobin 12.6  12.0 - 15.0 g/dL   HCT 28.4  13.2 - 44.0 %    Other Labs Obtained at Urgent Care Center:  DNA probes for gonorrhea, Chlamydia, Trichomonas, Gardnerella, Candida were obtained.  Results are pending at this time and we will call about any positive results.  Assessment:  The primary encounter diagnosis was Vaginitis. Diagnoses of Gastritis and Hypertension were also pertinent to this visit.  She has high blood pressure, but she's not taking her medication like she should be. I emphasized to her the importance of taking it regularly. On exam today she does not have much of a discharge. We did obtain DNA probes for all the usual pathogens. She is allergic to metronidazole, and she can't afford to take clindamycin. I prescribed amoxicillin as a less expensive alternative. Finally the upper abdominal pain may be due to gastritis, ulcer disease, or esophagitis. She was given some dietary information, and told to followup with her primary care physician. If symptoms fail to improve, I suggested that she see a gastroenterologist.  Plan:   1.  The following meds were prescribed:   New Prescriptions   AMOXICILLIN (AMOXIL) 500 MG CAPSULE    Take 1 capsule (500 mg total) by mouth 3 (three) times daily.   OMEPRAZOLE (PRILOSEC) 20 MG CAPSULE    Take 1 capsule (20 mg total) by mouth daily.   SUCRALFATE (CARAFATE) 1 G TABLET    1 tablet after meals and at bedtime.   The patient was told to take her blood pressure pill on a regular basis rather than just as needed. She states she has enough at  home to last for a while.  2.  The patient was instructed in symptomatic care and handouts were given. 3.  The patient was told to return if becoming worse in any way, if no better in 3 or 4 days, and given some red flag symptoms that would indicate earlier return.  Follow up:  The patient was told to follow up I recommended that she followup with her primary care family physician in one week to recheck her blood pressure and also to followup on the abdominal pain issue. If she's not getting better, I suggested she see a gastroenterologist.     Reuben Likes, MD 11/18/12 2129

## 2012-11-22 ENCOUNTER — Telehealth (HOSPITAL_COMMUNITY): Payer: Self-pay | Admitting: *Deleted

## 2012-11-22 ENCOUNTER — Telehealth (HOSPITAL_COMMUNITY): Payer: Self-pay | Admitting: Emergency Medicine

## 2012-11-22 MED ORDER — CLINDAMYCIN HCL 300 MG PO CAPS: 300.0000 mg | ORAL_CAPSULE | Freq: Two times a day (BID) | ORAL | Status: DC

## 2012-11-22 NOTE — Telephone Encounter (Signed)
Message copied by Reuben Likes on Tue Nov 22, 2012  5:31 PM ------      Message from: Vassie Moselle      Created: Tue Nov 22, 2012  2:16 PM      Regarding: labs                   ----- Message -----         From: [facility]         Sent: 11/21/2012   4:28 PM           To: [facility] Follow Up

## 2012-11-22 NOTE — Telephone Encounter (Signed)
Message copied by Reuben Likes on Tue Nov 22, 2012  9:11 PM ------      Message from: Vassie Moselle      Created: Tue Nov 22, 2012  9:04 PM      Regarding: labs                   ----- Message -----         From: [facility]         Sent: 11/21/2012   4:28 PM           To: Vassie Moselle, RN

## 2012-11-22 NOTE — ED Notes (Addendum)
In her DNA probe was positive for Gardnerella, all other results were negative. We'll call in results for Flagyl 500 mg twice a day for one week to her pharmacy.  Reuben Likes, MD 11/22/12 1732  Addendum: Since she is allergic to Flagyl will need to treat with clindamycin 3 mg twice a day for one week.  Reuben Likes, MD 11/22/12 [phone]

## 2012-11-22 NOTE — ED Notes (Signed)
GC/Chlamydia neg., Affirm: Gardnerella pos., Candida and Trich neg.  Pt. needs Cleocin.  Dr. Lorenz Coaster e- prescribed it to the [facility] on [address]. I called pt. and left a message to call. Vassie Moselle 11/22/2012

## 2012-11-23 ENCOUNTER — Telehealth (HOSPITAL_COMMUNITY): Payer: Self-pay | Admitting: *Deleted

## 2012-11-23 NOTE — ED Notes (Signed)
I called pt.  Pt. verified x 2 and given results.  Pt. told she needs Cleocin for bacterial vaginosis and instructed how to take medication. Pt. told it was sent to her preferred pharmacy.  Pt. voiced understanding. Vassie Moselle 11/23/2012

## 2013-02-26 ENCOUNTER — Other Ambulatory Visit: Payer: Self-pay | Admitting: Family Medicine

## 2013-03-03 ENCOUNTER — Encounter (HOSPITAL_COMMUNITY): Payer: Self-pay | Admitting: Emergency Medicine

## 2013-03-03 ENCOUNTER — Other Ambulatory Visit (HOSPITAL_COMMUNITY)
Admission: RE | Admit: 2013-03-03 | Discharge: 2013-03-03 | Disposition: A | Discharge status: Home / Self Care | Payer: Self-pay | Source: Ambulatory Visit | Attending: Family Medicine | Admitting: Family Medicine

## 2013-03-03 ENCOUNTER — Emergency Department (INDEPENDENT_AMBULATORY_CARE_PROVIDER_SITE_OTHER)
Admission: EM | Admit: 2013-03-03 | Discharge: 2013-03-03 | Disposition: A | Discharge status: Home / Self Care | Payer: Self-pay | Source: Home / Self Care | Attending: Family Medicine | Admitting: Family Medicine

## 2013-03-03 DIAGNOSIS — Z113 Encounter for screening for infections with a predominantly sexual mode of transmission: Secondary | ICD-10-CM | POA: Insufficient documentation

## 2013-03-03 DIAGNOSIS — N760 Acute vaginitis: Secondary | ICD-10-CM | POA: Insufficient documentation

## 2013-03-03 DIAGNOSIS — N730 Acute parametritis and pelvic cellulitis: Secondary | ICD-10-CM

## 2013-03-03 LAB — POCT URINALYSIS DIP (DEVICE)
Glucose, UA: NEGATIVE mg/dL
Leukocytes, UA: NEGATIVE
Nitrite: NEGATIVE
Urobilinogen, UA: 1 mg/dL (ref 0.0–1.0)

## 2013-03-03 MED ORDER — LIDOCAINE HCL (PF) 1 % IJ SOLN
INTRAMUSCULAR | Status: AC
  Filled 2013-03-03: qty 5

## 2013-03-03 MED ORDER — CEFTRIAXONE SODIUM 1 G IJ SOLR
1.0000 g | Freq: Once | INTRAMUSCULAR | Status: AC
Start: 2013-03-03 — End: 2013-03-03
  Administered 2013-03-03: 1 g via INTRAMUSCULAR

## 2013-03-03 MED ORDER — DOXYCYCLINE HYCLATE 100 MG PO CAPS: 100.0000 mg | ORAL_CAPSULE | Freq: Two times a day (BID) | ORAL | Status: DC

## 2013-03-03 MED ORDER — AZITHROMYCIN 250 MG PO TABS
ORAL_TABLET | ORAL | Status: AC
  Filled 2013-03-03: qty 4

## 2013-03-03 MED ORDER — CEFTRIAXONE SODIUM 1 G IJ SOLR
INTRAMUSCULAR | Status: AC
  Filled 2013-03-03: qty 10

## 2013-03-03 MED ORDER — AZITHROMYCIN 250 MG PO TABS
1000.0000 mg | ORAL_TABLET | Freq: Once | ORAL | Status: AC
  Administered 2013-03-03: 1000 mg via ORAL

## 2013-03-03 NOTE — ED Notes (Signed)
Pt c/o yellow vag discharge onset 2 days  Denies: odor, dysuria, hematuria  Also c/o abd pain onset 1 week Sx include: nausea, swelling w/intermittent pain Hx of acid reflux  She is alert and oriented w/no signs of acute distress.

## 2013-03-03 NOTE — ED Provider Notes (Addendum)
History     CSN: [id number]  Arrival date & time 03/03/13  1016   None     Chief Complaint  Patient presents with  . Vaginal Discharge    (Consider location/radiation/quality/duration/timing/severity/associated sxs/prior treatment) Patient is a 47 y.o. female presenting with vaginal discharge. The history is provided by the patient.  Vaginal Discharge This is a new problem. The current episode started more than 1 week ago. The problem has been gradually worsening. Associated symptoms include abdominal pain.    Past Medical History  Diagnosis Date  . Asthma   . Hypertension     History reviewed. No pertinent past surgical history.  No family history on file.  History  Substance Use Topics  . Smoking status: Never Smoker   . Smokeless tobacco: Never Used  . Alcohol Use: Yes    OB History   Grav Para Term Preterm Abortions TAB SAB Ect Mult Living                  Review of Systems  Constitutional: Negative.   Gastrointestinal: Positive for nausea and abdominal pain. Negative for vomiting and diarrhea.  Genitourinary: Positive for vaginal discharge and pelvic pain. Negative for dysuria, urgency, frequency and vaginal bleeding.  Musculoskeletal: Negative.   Skin: Negative.     Allergies  Codeine; Metronidazole; and Sulfamethoxazole  Home Medications   Current Outpatient Rx  Name  Route  Sig  Dispense  Refill  . albuterol (PROVENTIL HFA) 108 (90 BASE) MCG/ACT inhaler   Inhalation   Inhale 2 puffs into the lungs every 4 (four) hours as needed for wheezing. Inhale 2 puff using inhaler every four hours   6.7 g   2   . amoxicillin (AMOXIL) 500 MG capsule   Oral   Take 1 capsule (500 mg total) by mouth 3 (three) times daily.   30 capsule   0     Dispense as written.   . clindamycin (CLEOCIN) 300 MG capsule   Oral   Take 1 capsule (300 mg total) by mouth 2 (two) times daily with a meal.   14 capsule   0   . doxycycline (VIBRAMYCIN) 100 MG capsule    Oral   Take 1 capsule (100 mg total) by mouth 2 (two) times daily.   20 capsule   0   . hydrochlorothiazide (HYDRODIURIL) 12.5 MG tablet      TAKE 1 TABLET BY MOUTH DAILY   30 tablet   1     PATIENT NEEDS APPT WITH PCP FOR FURTHER REFILLS   . metroNIDAZOLE (METROGEL VAGINAL) 0.75 % vaginal gel   Vaginal   Place 1 Applicatorful vaginally daily. Use for 5 nights.   70 g   0   . MONONESSA 0.25-35 MG-MCG tablet      TAKE 1 TABLET BY MOUTH DAILY   84 tablet   0   . omeprazole (PRILOSEC) 20 MG capsule   Oral   Take 1 capsule (20 mg total) by mouth daily.   30 capsule   2   . omeprazole (PRILOSEC) 20 MG capsule   Oral   Take 1 capsule (20 mg total) by mouth daily.   30 capsule   0   . sucralfate (CARAFATE) 1 G tablet      1 tablet after meals and at bedtime.   120 tablet   0   . tobramycin (TOBREX) 0.3 % ophthalmic solution   Left Eye   Place 1 drop into the left eye every  6 (six) hours. After warm soak to eye each time.   5 mL   0     BP 152/94  Pulse 52  Temp(Src) 98.5 F (36.9 C) (Oral)  Resp 18  SpO2 100%  LMP 02/24/2013  Physical Exam  Nursing note and vitals reviewed. Constitutional: She is oriented to person, place, and time. She appears well-developed and well-nourished.  Abdominal: Soft. Bowel sounds are normal. She exhibits no mass. There is tenderness. There is no rebound and no guarding.  Genitourinary: Uterus is tender. Uterus is not enlarged. Cervix exhibits motion tenderness and discharge. Right adnexum displays no mass and no tenderness. Left adnexum displays no mass and no tenderness. No erythema around the vagina. Vaginal discharge found.  Neurological: She is alert and oriented to person, place, and time.  Skin: Skin is warm and dry.    ED Course  Procedures (including critical care time)  Labs Reviewed  POCT URINALYSIS DIP (DEVICE) - Abnormal; Notable for the following:    Hgb urine dipstick TRACE (*)    All other components  within normal limits  CERVICOVAGINAL ANCILLARY ONLY   No results found.   1. PID (acute pelvic inflammatory disease)       MDM          Linna Hoff, MD 03/03/13 1120  Linna Hoff, MD 03/03/13 1122  Linna Hoff, MD 03/03/13 [phone]

## 2013-03-08 ENCOUNTER — Telehealth (HOSPITAL_COMMUNITY): Payer: Self-pay | Admitting: *Deleted

## 2013-03-08 MED ORDER — METRONIDAZOLE 500 MG PO TABS: 500.0000 mg | ORAL_TABLET | Freq: Two times a day (BID) | ORAL | Status: DC

## 2013-03-08 MED ORDER — CLINDAMYCIN HCL 150 MG PO CAPS: 150.0000 mg | ORAL_CAPSULE | Freq: Three times a day (TID) | ORAL | Status: DC

## 2013-03-08 NOTE — ED Notes (Signed)
GC/Chlamydia neg. Affirm: Candida and Trich neg., Gardnerella pos.  5/19  Message sent to Dr. Blair Heys in basket.  5/21 Order obtained for Flagyl, but has an allergy.   I called pt.  Pt. verified x 2 and given results.  Pt. told she needs Flagyl for bacterial vaginosis.  Dr. noted allergy and wanted me to call her and ask what kind of allergy.  She said rash and she can't hardly breathe.  I told her the doctor would prescribe something else to the [facility] on [address].  Dr. Artis Flock notified and he wanted to try Metrogel. He asked me to call the pharmacist. I called [facility] and discussed it with the pharmacist. He said it is a possibility, for a reaction.  It is a chance it could get into the systemic system and cause the same reaction. I told him I would call back.  Dr. Artis Flock notified and changed Rx. to Clindamycin. Pharmacist and pt. notified of Rx. Change. Vassie Moselle 03/08/2013

## 2013-03-16 ENCOUNTER — Emergency Department (HOSPITAL_COMMUNITY)
Admission: EM | Admit: 2013-03-16 | Discharge: 2013-03-16 | Disposition: A | Discharge status: Home / Self Care | Payer: Self-pay | Attending: Emergency Medicine | Admitting: Emergency Medicine

## 2013-03-16 ENCOUNTER — Encounter (HOSPITAL_COMMUNITY): Payer: Self-pay | Admitting: *Deleted

## 2013-03-16 ENCOUNTER — Emergency Department (HOSPITAL_COMMUNITY)
Admission: EM | Admit: 2013-03-16 | Discharge: 2013-03-16 | Disposition: A | Discharge status: Nursing Home (Includes ICF) | Payer: Self-pay | Source: Home / Self Care

## 2013-03-16 DIAGNOSIS — J45909 Unspecified asthma, uncomplicated: Secondary | ICD-10-CM | POA: Insufficient documentation

## 2013-03-16 DIAGNOSIS — R209 Unspecified disturbances of skin sensation: Secondary | ICD-10-CM | POA: Insufficient documentation

## 2013-03-16 DIAGNOSIS — T7840XA Allergy, unspecified, initial encounter: Secondary | ICD-10-CM

## 2013-03-16 DIAGNOSIS — Z09 Encounter for follow-up examination after completed treatment for conditions other than malignant neoplasm: Secondary | ICD-10-CM | POA: Insufficient documentation

## 2013-03-16 DIAGNOSIS — Z79899 Other long term (current) drug therapy: Secondary | ICD-10-CM | POA: Insufficient documentation

## 2013-03-16 DIAGNOSIS — I10 Essential (primary) hypertension: Secondary | ICD-10-CM

## 2013-03-16 DIAGNOSIS — T368X5A Adverse effect of other systemic antibiotics, initial encounter: Secondary | ICD-10-CM | POA: Insufficient documentation

## 2013-03-16 DIAGNOSIS — R220 Localized swelling, mass and lump, head: Secondary | ICD-10-CM | POA: Insufficient documentation

## 2013-03-16 DIAGNOSIS — T50995A Adverse effect of other drugs, medicaments and biological substances, initial encounter: Secondary | ICD-10-CM

## 2013-03-16 DIAGNOSIS — N760 Acute vaginitis: Secondary | ICD-10-CM | POA: Insufficient documentation

## 2013-03-16 MED ORDER — DIPHENHYDRAMINE HCL 50 MG/ML IJ SOLN
50.0000 mg | Freq: Once | INTRAMUSCULAR | Status: AC
  Administered 2013-03-16: 50 mg via INTRAMUSCULAR

## 2013-03-16 MED ORDER — TRIAMCINOLONE ACETONIDE 40 MG/ML IJ SUSP
40.0000 mg | Freq: Once | INTRAMUSCULAR | Status: AC
  Administered 2013-03-16: 40 mg via INTRAMUSCULAR

## 2013-03-16 MED ORDER — DIPHENHYDRAMINE HCL 50 MG/ML IJ SOLN
INTRAMUSCULAR | Status: AC
  Filled 2013-03-16: qty 1

## 2013-03-16 MED ORDER — TRIAMCINOLONE ACETONIDE 40 MG/ML IJ SUSP
INTRAMUSCULAR | Status: AC
  Filled 2013-03-16: qty 5

## 2013-03-16 NOTE — ED Notes (Addendum)
Took clindamycin today and experienced an allergic reaction.Marland Kitchen Upper lip swollen. No airway compromise. ucc called and asked [place] to observe pt. S/p treatment - 50 mg of benadryl and kenalog injection. Pt. States, "swelling has improved since treatment." received medication at 2025.

## 2013-03-16 NOTE — ED Provider Notes (Signed)
History  This chart was scribed for non-physician practitioner, Hector Shade, working with Dione Booze, MD by Ardeen Jourdain, ED Scribe. This patient was seen in room TR04C/TR04C and the patient's care was started at 2100.  CSN: [id number]  Arrival date & time 03/16/13  2039   First MD Initiated Contact with Patient 03/16/13 2100      Chief Complaint  Patient presents with  . Allergic Reaction  . Follow-up     The history is provided by the patient. No language interpreter was used.    HPI Comments: Sara Day is a 47 y.o. female who presents to the Emergency Department complaining of an allergic reaction to clindamycin that began a few hours ago with associated facial swelling and numbness. Pt states she was evaluated at [facility] earlier today for the same symptoms. Pt was given Benadryl 50mg  IM and Kenalog 40mg  IM. UCC sent the pt to [facility] for follow-up. Pt denies any SOB, rash, trouble breathing, trouble swallowing and nausea as associated symptoms.   Past Medical History  Diagnosis Date  . Asthma   . Hypertension     History reviewed. No pertinent past surgical history.  Family History  Problem Relation Age of Onset  . Heart attack Father     History  Substance Use Topics  . Smoking status: Never Smoker   . Smokeless tobacco: Never Used  . Alcohol Use: Yes     Comment: occasional   No OB history available.   Review of Systems  HENT: Positive for facial swelling.   All other systems reviewed and are negative.    Allergies  Clindamycin/lincomycin; Codeine; Metronidazole; Sulfa antibiotics; and Sulfamethoxazole  Home Medications   Current Outpatient Rx  Name  Route  Sig  Dispense  Refill  . albuterol (PROVENTIL HFA) 108 (90 BASE) MCG/ACT inhaler   Inhalation   Inhale 2 puffs into the lungs every 4 (four) hours as needed for wheezing. Inhale 2 puff using inhaler every four hours   6.7 g   2   . clindamycin (CLEOCIN) 150 MG capsule   Oral   Take 1  capsule (150 mg total) by mouth 3 (three) times daily.   21 capsule   0   . hydrochlorothiazide (HYDRODIURIL) 12.5 MG tablet   Oral   Take 12.5 mg by mouth daily.         . norgestimate-ethinyl estradiol (ORTHO-CYCLEN,SPRINTEC,PREVIFEM) 0.25-35 MG-MCG tablet   Oral   Take 1 tablet by mouth daily.           Triage Vitals: BP 179/92  Pulse 56  Temp(Src) 98 F (36.7 C) (Oral)  Resp 14  SpO2 100%  LMP 02/24/2013  Physical Exam  Nursing note and vitals reviewed. Constitutional: She is oriented to person, place, and time. She appears well-developed and well-nourished. No distress.  HENT:  Head: Normocephalic and atraumatic.  Eyes: EOM are normal. Pupils are equal, round, and reactive to light.  Neck: Normal range of motion. Neck supple. No tracheal deviation present.  Cardiovascular: Normal rate, regular rhythm and normal heart sounds.  Exam reveals no gallop and no friction rub.   No murmur heard. Pulmonary/Chest: Effort normal and breath sounds normal. No respiratory distress. She has no wheezes. She has no rales. She exhibits no tenderness.  Abdominal: Soft. She exhibits no distension.  Musculoskeletal: Normal range of motion. She exhibits no edema.  Neurological: She is alert and oriented to person, place, and time. She has normal strength. No cranial nerve deficit or  sensory deficit. Gait normal. GCS eye subscore is 4. GCS verbal subscore is 5. GCS motor subscore is 6.  Skin: Skin is warm and dry. She is not diaphoretic.  Psychiatric: She has a normal mood and affect. Her behavior is normal.    ED Course  Procedures (including critical care time)  DIAGNOSTIC STUDIES: Oxygen Saturation is 100% on room air, normal by my interpretation.    COORDINATION OF CARE:  9:05 PM-Discussed treatment plan which includes instructions for home care with pt at bedside and pt agreed to plan.    Labs Reviewed - No data to display No results found.   1. Follow up   2. Allergic  reaction to drug       MDM  Pt treated at urgent care for allergic reaction to antibiotic, clindamycin, taking for bacterial vaginosis. Upon arrival to ED, pt appears well.  No facial swelling, dysarthria, or dyspnea.  Denies nausea, vomiting, SOB, trouble breathing or swallowing. Pt anxious to go home. Pt discharged home.  F/u with Dr. Fara Boros tomorrow to see if he wants to start pt on new antibiotic.  Return to ED if symptoms return, difficulty breathing or swallowing. Vitals: unremarkable. Discharged in stable condition.  Pt verbalized understanding and agreement with treatment plan.    I personally performed the services described in this documentation, which was scribed in my presence. The recorded information has been reviewed and is accurate.       Junius Finner, PA-C 03/17/13 [phone]

## 2013-03-16 NOTE — ED Provider Notes (Signed)
Medical screening examination/treatment/procedure(s) were performed by non-physician practitioner and as supervising physician I was immediately available for consultation/collaboration.  Leslee Home, M.D.  Reuben Likes, MD 03/16/13 2037

## 2013-03-16 NOTE — ED Notes (Signed)
C/o swelling to R upper swelling onset today.  States she took the Clindamycin at 1830. Rx. ordered on 5/21 but did not start it until today.

## 2013-03-16 NOTE — ED Provider Notes (Signed)
History     CSN: [id number]  Arrival date & time 03/16/13  1901   First MD Initiated Contact with Patient 03/16/13 1914      Chief Complaint  Patient presents with  . Oral Swelling    (Consider location/radiation/quality/duration/timing/severity/associated sxs/prior treatment) HPI Comments: 47 year old female was seen in the urgent care approximately week ago for bacterial vaginosis. She was prescribed clindamycin. She just got the prescription filled and she took her first dose at 1830 hours today. Approximately 10-15 minutes later she began to experience numbness and swelling of the lip. She presents with swelling and stiffness of the upper lip associated with dysarthria. She states she normally speaks clearly. She denies shortness of breath or swelling of the tongue.   Past Medical History  Diagnosis Date  . Asthma   . Hypertension     History reviewed. No pertinent past surgical history.  Family History  Problem Relation Age of Onset  . Heart attack Father     History  Substance Use Topics  . Smoking status: Never Smoker   . Smokeless tobacco: Never Used  . Alcohol Use: Yes     Comment: occasional    OB History   Grav Para Term Preterm Abortions TAB SAB Ect Mult Living                  Review of Systems  Constitutional: Negative for fever, activity change and fatigue.  HENT: Positive for facial swelling. Negative for congestion, sore throat, rhinorrhea, mouth sores, neck pain and postnasal drip.   Eyes: Negative.   Respiratory: Negative.   Cardiovascular: Negative.   Skin: Negative for rash.  Neurological: Positive for facial asymmetry, speech difficulty and numbness.  Psychiatric/Behavioral: Negative.     Allergies  Codeine; Metronidazole; and Sulfamethoxazole  Home Medications   Current Outpatient Rx  Name  Route  Sig  Dispense  Refill  . clindamycin (CLEOCIN) 150 MG capsule   Oral   Take 1 capsule (150 mg total) by mouth 3 (three) times  daily.   21 capsule   0   . hydrochlorothiazide (HYDRODIURIL) 12.5 MG tablet      TAKE 1 TABLET BY MOUTH DAILY   30 tablet   1     PATIENT NEEDS APPT WITH PCP FOR FURTHER REFILLS   . MONONESSA 0.25-35 MG-MCG tablet      TAKE 1 TABLET BY MOUTH DAILY   84 tablet   0   . albuterol (PROVENTIL HFA) 108 (90 BASE) MCG/ACT inhaler   Inhalation   Inhale 2 puffs into the lungs every 4 (four) hours as needed for wheezing. Inhale 2 puff using inhaler every four hours   6.7 g   2   . amoxicillin (AMOXIL) 500 MG capsule   Oral   Take 1 capsule (500 mg total) by mouth 3 (three) times daily.   30 capsule   0     Dispense as written.   . clindamycin (CLEOCIN) 300 MG capsule   Oral   Take 1 capsule (300 mg total) by mouth 2 (two) times daily with a meal.   14 capsule   0   . doxycycline (VIBRAMYCIN) 100 MG capsule   Oral   Take 1 capsule (100 mg total) by mouth 2 (two) times daily.   20 capsule   0   . metroNIDAZOLE (METROGEL VAGINAL) 0.75 % vaginal gel   Vaginal   Place 1 Applicatorful vaginally daily. Use for 5 nights.   70 g   0   .  omeprazole (PRILOSEC) 20 MG capsule   Oral   Take 1 capsule (20 mg total) by mouth daily.   30 capsule   2   . omeprazole (PRILOSEC) 20 MG capsule   Oral   Take 1 capsule (20 mg total) by mouth daily.   30 capsule   0   . sucralfate (CARAFATE) 1 G tablet      1 tablet after meals and at bedtime.   120 tablet   0   . tobramycin (TOBREX) 0.3 % ophthalmic solution   Left Eye   Place 1 drop into the left eye every 6 (six) hours. After warm soak to eye each time.   5 mL   0     BP 169/94  Pulse 56  Temp(Src) 98.2 F (36.8 C) (Oral)  Resp 15  SpO2 98%  LMP 02/24/2013  Physical Exam  Nursing note and vitals reviewed. Constitutional: She is oriented to person, place, and time. She appears well-developed and well-nourished. No distress.  HENT:  Mouth/Throat: Oropharynx is clear and moist. No oropharyngeal exudate.  Eyes:  EOM are normal.  Neck: Normal range of motion.  Cardiovascular: Normal rate, regular rhythm and normal heart sounds.   Pulmonary/Chest: Effort normal and breath sounds normal. No respiratory distress. She has no wheezes.  Neurological: She is alert and oriented to person, place, and time. She exhibits normal muscle tone.  Skin: Skin is warm.  Psychiatric: Her mood appears anxious. Her speech is slurred. Cognition and memory are impaired.  I suspect there may be some underlying low cognitive performance. She does have some trouble following commands. When attempting cranial nerve checks she was unable to open her mouth widely until after coaching several times. She is able to rotate her head left and right but is somewhat spastic and movement. When asked to rotate her head against resistance with my hand or finger she was unable.    ED Course  Procedures (including critical care time)  Labs Reviewed - No data to display No results found.   1. Allergic reaction to drug       MDM  Patient was administered Benadryl 50 mg IM and Kenalog 40 mg IM. Since this reaction took place approximately an hour and 45 minutes ago it may be best that she be observed for improvement and/or worsening. She denies swelling of the tongue and I do not appreciate swelling however her speech is  Dysarthric.She states she usually has normal speech. She also has some difficulty in following and understanding commands. Some of this may be  due to low cognitive status however, it also be a reaction to the clindamycin. Reports allergy to Flagyl as well. She will need to call for a ride home post Benadryl admin.  Hayden Rasmussen, NP 03/16/13 2028

## 2013-03-18 NOTE — ED Provider Notes (Signed)
Medical screening examination/treatment/procedure(s) were performed by non-physician practitioner and as supervising physician I was immediately available for consultation/collaboration.  Jahniah Pallas, MD 03/18/13 0028 

## 2013-04-19 NOTE — ED Notes (Signed)
Patient called, concerned for being told she may have GC based on the appearance of her exam. Patient discussed w S [place], repeating content of telephone conversation , treating prophylacticly  While still in clinic to prevent a second visit.

## 2013-04-21 ENCOUNTER — Other Ambulatory Visit: Payer: Self-pay | Admitting: Family Medicine

## 2013-06-18 ENCOUNTER — Other Ambulatory Visit: Payer: Self-pay | Admitting: Family Medicine

## 2013-08-25 ENCOUNTER — Ambulatory Visit: Payer: Self-pay | Admitting: Family Medicine

## 2013-08-29 ENCOUNTER — Emergency Department (INDEPENDENT_AMBULATORY_CARE_PROVIDER_SITE_OTHER): Payer: Self-pay

## 2013-08-29 ENCOUNTER — Other Ambulatory Visit (HOSPITAL_COMMUNITY)
Admission: RE | Admit: 2013-08-29 | Discharge: 2013-08-29 | Disposition: A | Discharge status: Home / Self Care | Payer: Self-pay | Source: Ambulatory Visit | Attending: Family Medicine | Admitting: Family Medicine

## 2013-08-29 ENCOUNTER — Emergency Department (INDEPENDENT_AMBULATORY_CARE_PROVIDER_SITE_OTHER)
Admission: EM | Admit: 2013-08-29 | Discharge: 2013-08-29 | Disposition: A | Discharge status: Home / Self Care | Payer: Self-pay | Source: Home / Self Care

## 2013-08-29 ENCOUNTER — Encounter (HOSPITAL_COMMUNITY): Payer: Self-pay | Admitting: Emergency Medicine

## 2013-08-29 DIAGNOSIS — R1032 Left lower quadrant pain: Secondary | ICD-10-CM

## 2013-08-29 DIAGNOSIS — N7093 Salpingitis and oophoritis, unspecified: Secondary | ICD-10-CM

## 2013-08-29 DIAGNOSIS — N760 Acute vaginitis: Secondary | ICD-10-CM | POA: Insufficient documentation

## 2013-08-29 DIAGNOSIS — Z113 Encounter for screening for infections with a predominantly sexual mode of transmission: Secondary | ICD-10-CM | POA: Insufficient documentation

## 2013-08-29 DIAGNOSIS — R1031 Right lower quadrant pain: Secondary | ICD-10-CM

## 2013-08-29 DIAGNOSIS — N7091 Salpingitis, unspecified: Secondary | ICD-10-CM

## 2013-08-29 DIAGNOSIS — R141 Gas pain: Secondary | ICD-10-CM

## 2013-08-29 DIAGNOSIS — N72 Inflammatory disease of cervix uteri: Secondary | ICD-10-CM

## 2013-08-29 DIAGNOSIS — R52 Pain, unspecified: Secondary | ICD-10-CM

## 2013-08-29 LAB — POCT URINALYSIS DIP (DEVICE)
Bilirubin Urine: NEGATIVE
Glucose, UA: NEGATIVE mg/dL
Hgb urine dipstick: NEGATIVE
Leukocytes, UA: NEGATIVE
Nitrite: NEGATIVE
Protein, ur: NEGATIVE mg/dL
Specific Gravity, Urine: 1.02 (ref 1.005–1.030)
pH: 7.5 (ref 5.0–8.0)

## 2013-08-29 MED ORDER — AZITHROMYCIN 250 MG PO TABS
1000.0000 mg | ORAL_TABLET | Freq: Every day | ORAL | Status: DC
  Administered 2013-08-29: 1000 mg via ORAL

## 2013-08-29 MED ORDER — AZITHROMYCIN 250 MG PO TABS
ORAL_TABLET | ORAL | Status: AC
  Filled 2013-08-29: qty 1

## 2013-08-29 MED ORDER — CEFTRIAXONE SODIUM 250 MG IJ SOLR
INTRAMUSCULAR | Status: AC
  Filled 2013-08-29: qty 250

## 2013-08-29 MED ORDER — CEFTRIAXONE SODIUM 250 MG IJ SOLR
250.0000 mg | Freq: Once | INTRAMUSCULAR | Status: AC
  Administered 2013-08-29: 250 mg via INTRAMUSCULAR

## 2013-08-29 MED ORDER — LIDOCAINE HCL (PF) 1 % IJ SOLN
INTRAMUSCULAR | Status: AC
  Filled 2013-08-29: qty 5

## 2013-08-29 NOTE — ED Notes (Signed)
Patient needing notes for work/school.  Verified with david, np

## 2013-08-29 NOTE — ED Notes (Signed)
Patient aware of post injection delay prior to leaving

## 2013-08-29 NOTE — ED Provider Notes (Signed)
CSN: [id number]     Arrival date & time 08/29/13  1449 History   First MD Initiated Contact with Patient 08/29/13 1556     Chief Complaint  Patient presents with  . Abdominal Pain   (Consider location/radiation/quality/duration/timing/severity/associated sxs/prior Treatment) HPI Comments: 47 year old female complaining of pain across the lower abdomen for one month. It occurs intermittently every other day. Occasionally, in the a.m. it is crampy. She denies associated GI symptoms such as nausea, vomiting, diarrhea or constipation. Having a bowel movement has not relieved the pain. Denies fever. She states one to 2 weeks she has had a light, scant yellowish vaginal discharge. Denies pelvic pain or urinary symptoms.   Past Medical History  Diagnosis Date  . Asthma   . Hypertension    History reviewed. No pertinent past surgical history. Family History  Problem Relation Age of Onset  . Heart attack Father    History  Substance Use Topics  . Smoking status: Never Smoker   . Smokeless tobacco: Never Used  . Alcohol Use: Yes     Comment: occasional   OB History   Grav Para Term Preterm Abortions TAB SAB Ect Mult Living                 Review of Systems  Constitutional: Negative.   HENT: Negative.   Respiratory: Negative.   Cardiovascular: Negative.   Gastrointestinal: Positive for abdominal pain. Negative for nausea, vomiting, diarrhea, constipation and abdominal distention.  Genitourinary: Negative.   Musculoskeletal: Negative.   Skin: Negative for color change and rash.    Allergies  Clindamycin/lincomycin; Codeine; Metronidazole; Sulfa antibiotics; and Sulfamethoxazole  Home Medications   Current Outpatient Rx  Name  Route  Sig  Dispense  Refill  . albuterol (PROVENTIL HFA) 108 (90 BASE) MCG/ACT inhaler   Inhalation   Inhale 2 puffs into the lungs every 4 (four) hours as needed for wheezing. Inhale 2 puff using inhaler every four hours   6.7 g   2   .  clindamycin (CLEOCIN) 150 MG capsule   Oral   Take 1 capsule (150 mg total) by mouth 3 (three) times daily.   21 capsule   0   . hydrochlorothiazide (HYDRODIURIL) 12.5 MG tablet      TAKE 1 TABLET BY MOUTH DAILY   30 tablet   0   . MONONESSA 0.25-35 MG-MCG tablet      TAKE ONE TABLET BY MOUTH EVERY DAY   84 tablet   0   . norgestimate-ethinyl estradiol (ORTHO-CYCLEN,SPRINTEC,PREVIFEM) 0.25-35 MG-MCG tablet   Oral   Take 1 tablet by mouth daily.          BP 149/47  Pulse 57  Temp(Src) 98.3 F (36.8 C) (Oral)  Resp 12  SpO2 100%  LMP 08/13/2013 Physical Exam  Nursing note and vitals reviewed. Constitutional: She is oriented to person, place, and time. She appears well-developed and well-nourished. No distress.  Appears generally well, in no acute distress, sitting on the exam table with her phone in her hand in reading, relaxed posturing, speech, and smooth.  Eyes: EOM are normal.  Neck: Normal range of motion. Neck supple.  Cardiovascular: Normal rate, regular rhythm and normal heart sounds.   Pulmonary/Chest: Effort normal. No respiratory distress.  Abdominal: Soft. Bowel sounds are normal. She exhibits no distension and no mass. There is tenderness. There is no rebound and no guarding.  Generalized tenderness to deep palpation, worse RLQ. But present in all quadrants and periumbilical.   No peritoneal  signs.   Genitourinary:  No tenderness over the pelvis/suprapubic.  Cervix midline. No lesions. Scant light green vaginal discharge primarily vaginal vault +CMT and Right adnexal tenderness.   Musculoskeletal: Normal range of motion. She exhibits no edema.  Lymphadenopathy:    She has no cervical adenopathy.  Neurological: She is alert and oriented to person, place, and time. She exhibits normal muscle tone.  Skin: Skin is warm and dry. She is not diaphoretic.  Psychiatric: She has a normal mood and affect.    ED Course  Procedures (including critical care  time) Labs Review Labs Reviewed  POCT URINALYSIS DIP (DEVICE)   Imaging Review Dg Abd 1 View  08/29/2013   CLINICAL DATA:  Abdominal cramping.  EXAM: ABDOMEN - 1 VIEW  COMPARISON:  None.  FINDINGS: The bowel gas pattern is within the limits of normal. There are phleboliths within the pelvis. The bony structures appear normal.  IMPRESSION: Negative.  No acute intra-abdominal abnormality is demonstrated.   Electronically Signed   By: Sueanne Maniaci  [place]   On: 08/29/2013 17:35      MDM   1. Abdominal pain, acute, bilateral lower quadrant   2. Abdominal gas pain     Rocephin 250 mg IM and azithromycin 1gm po now. Cerv ancillary swabs obtained.  There is no evidence of peritonitis or acute abdomen. There is a normal gas pattern with the bulk of the gas to the right lower quadrant as is her tenderness. Miralax as needed, increase fiber in diet, mor fluids. See your doctor in follow up.  Hayden Rasmussen, NP 08/29/13 1804  Hayden Rasmussen, NP 08/29/13 [phone]

## 2013-08-29 NOTE — ED Notes (Signed)
Patient not ready for discharge 

## 2013-08-29 NOTE — ED Notes (Signed)
Abdominal pain, described as cramping.  Reports yellow vaginal discharge, denies urinary symptoms, last bm was today.  Denies back pain

## 2013-08-31 NOTE — ED Provider Notes (Signed)
Medical screening examination/treatment/procedure(s) were performed by resident physician or non-physician practitioner and as supervising physician I was immediately available for consultation/collaboration.   Barkley Bruns MD.   Linna Hoff, MD 08/31/13 1919

## 2013-09-08 ENCOUNTER — Telehealth (HOSPITAL_COMMUNITY): Payer: Self-pay | Admitting: *Deleted

## 2013-09-08 NOTE — ED Notes (Signed)
Pt. called on VM for her lab results.  I called pt. Pt. verified x 2 and given neg. results.  ( GC/Chlamydia, Affirm) Vassie Moselle 09/08/2013

## 2013-09-11 ENCOUNTER — Other Ambulatory Visit: Payer: Self-pay | Admitting: Family Medicine

## 2013-09-12 ENCOUNTER — Telehealth: Payer: Self-pay | Admitting: Family Medicine

## 2013-09-12 NOTE — Telephone Encounter (Signed)
To [facility] red team - please call pt and let her know I have refilled her birth control but she needs to schedule an appointment to meet her new PCP. Thanks!

## 2013-09-13 NOTE — Telephone Encounter (Signed)
Left message for patient to call me back.  Please give message below from MD if patient calls back.  Thank you.  Enrico Eaddy, Darlyne Russian, CMA

## 2013-09-13 NOTE — Telephone Encounter (Signed)
Attempted to call pt again., no answer.  Addylynn Balin, Darlyne Russian, CMA

## 2013-11-18 ENCOUNTER — Other Ambulatory Visit: Payer: Self-pay | Admitting: Family Medicine

## 2013-11-20 ENCOUNTER — Telehealth: Payer: Self-pay | Admitting: Family Medicine

## 2013-11-20 NOTE — Telephone Encounter (Signed)
To [facility] red team - please call pt and let her know I have refilled one month's worth of her HCTZ but she needs to schedule an appointment to follow up on her blood pressure and other medical problems in order to receive any further refills. She has not been seen here since October 2013 and thus I will not refill her medications again unless she schedules an appointment.  Thanks!  Leeanne Rio, MD

## 2013-11-20 NOTE — Telephone Encounter (Signed)
Pt called back. Informed of Dr.McIntyre's message. Pt agreed and will sched. OV. Javier Glazier, Gerrit Heck

## 2013-11-20 NOTE — Telephone Encounter (Signed)
LMVM to call back. Please see message.Mauricia Area

## 2013-11-24 ENCOUNTER — Telehealth: Payer: Self-pay | Admitting: Family Medicine

## 2013-11-24 ENCOUNTER — Other Ambulatory Visit (HOSPITAL_COMMUNITY)
Admission: RE | Admit: 2013-11-24 | Discharge: 2013-11-24 | Disposition: A | Discharge status: Home / Self Care | Payer: No Typology Code available for payment source | Source: Ambulatory Visit | Attending: Family Medicine | Admitting: Family Medicine

## 2013-11-24 ENCOUNTER — Ambulatory Visit (INDEPENDENT_AMBULATORY_CARE_PROVIDER_SITE_OTHER): Payer: No Typology Code available for payment source | Admitting: Family Medicine

## 2013-11-24 ENCOUNTER — Encounter: Payer: Self-pay | Admitting: Family Medicine

## 2013-11-24 VITALS — BP 130/89 | HR 63 | Temp 98.1°F | Ht 61.0 in | Wt 134.0 lb

## 2013-11-24 DIAGNOSIS — R109 Unspecified abdominal pain: Secondary | ICD-10-CM

## 2013-11-24 DIAGNOSIS — Z113 Encounter for screening for infections with a predominantly sexual mode of transmission: Secondary | ICD-10-CM

## 2013-11-24 DIAGNOSIS — Z309 Encounter for contraceptive management, unspecified: Secondary | ICD-10-CM

## 2013-11-24 LAB — POCT URINE PREGNANCY: Preg Test, Ur: NEGATIVE

## 2013-11-24 LAB — POCT WET PREP (WET MOUNT): Clue Cells Wet Prep Whiff POC: NEGATIVE

## 2013-11-24 MED ORDER — DOCUSATE SODIUM 100 MG PO CAPS: 100.0000 mg | ORAL_CAPSULE | Freq: Every day | ORAL | Status: DC

## 2013-11-24 MED ORDER — ALBUTEROL SULFATE HFA 108 (90 BASE) MCG/ACT IN AERS: 2.0000 | INHALATION_SPRAY | RESPIRATORY_TRACT | Status: DC | PRN

## 2013-11-24 MED ORDER — METRONIDAZOLE 0.75 % VA GEL: 1.0000 | Freq: Every day | VAGINAL | Status: DC

## 2013-11-24 NOTE — Progress Notes (Signed)
Patient ID: Sara Day, female   DOB: 11/30/1965, 48 y.o.   MRN: [id number]  HPI:  STD check: would like to be tested for STD's today. She has had one female partner in the last year. She has a hx of gonorrhea within the last year, which was treated (partner was also treated). She uses oral contraception but does not use condoms. Has had some discharge that has varied in color (white, yellow). Has not had any pelvic pain. Her LMP ended one week ago. She does occasionally miss taking her birth control and therefore does have spotting sometimes. She thinks she might have BV currently.  Abdominal pain: reports hx of chronic abdominal tenderness just below the umbilicus. A doctor told her before that he thought she was constipated. She stools about every other day. Her stools are hard when they come out. Eating and drinking fine.  ROS: See HPI  [place]: hx HTN, GERD. Does not smoke cigarettes.  PHYSICAL EXAM: BP 130/89  Pulse 63  Temp(Src) 98.1 F (36.7 C) (Oral)  Ht 5\' 1"  (1.549 m)  Wt 134 lb (60.782 kg)  BMI 25.33 kg/m2  LMP 11/16/2013 Gen: NAD HEENT: NCAT Heart: RRR, no murmurs Lungs: CTAB, NWOB Abdomen: +BS, soft, nondistended. No organomegaly. Very mild TTP inferior to the umbilicus. GU: normal appearing external genitalia without lesions. Vagina is moist with some menstrual blood present in the vault discharge. Cervix normal in appearance. No cervical motion tenderness or tenderness on bimanual exam. No adnexal masses. Neuro: grossly nonfocal, speech intact  ASSESSMENT/PLAN:  # STD Screening: will obtain wet prep, gc/chlamydia, HIV, RPR, acute hepatitis panel to screen for STD's  # Abdominal pain: abd exam benign today (just mild tenderness, chronic per pt). Suspect constipation may be contributing. Will rx colace as stool softener and monitor for improvement.  # Health maintenance:  -not due for pap until October 2015 -recommended to pt that she f/u in the next month for a  complete physical to review health maintenance items (will defer pelvic exam at that visit)  See problem based charting for additional assessment/plan.  FOLLOW UP: F/u in one month for complete physical and to discuss contraception options.  [place]. Ardelia Mems, [place]

## 2013-11-24 NOTE — Patient Instructions (Addendum)
It was nice to meet you today!  I will call you if your test results are not normal.  Otherwise, I will send you a letter.  If you do not hear from me with in 2 weeks please call our office.      For abdominal pain: let's try a stool softener and see if it helps. I refilled your albuterol.  Follow up in the next month or so to discuss birth control options and do a complete physical.  Be well, Dr. Ardelia Mems   Contraception Choices Contraception (birth control) is the use of any methods or devices to prevent pregnancy. Below are some methods to help avoid pregnancy. HORMONAL METHODS   Contraceptive implant This is a thin, plastic tube containing progesterone hormone. It does not contain estrogen hormone. Your health care provider inserts the tube in the inner part of the upper arm. The tube can remain in place for up to 3 years. After 3 years, the implant must be removed. The implant prevents the ovaries from releasing an egg (ovulation), thickens the cervical mucus to prevent sperm from entering the uterus, and thins the lining of the inside of the uterus.  Progesterone-only injections These injections are given every 3 months by your health care provider to prevent pregnancy. This synthetic progesterone hormone stops the ovaries from releasing eggs. It also thickens cervical mucus and changes the uterine lining. This makes it harder for sperm to survive in the uterus.  Birth control pills These pills contain estrogen and progesterone hormone. They work by preventing the ovaries from releasing eggs (ovulation). They also cause the cervical mucus to thicken, preventing the sperm from entering the uterus. Birth control pills are prescribed by a health care provider.Birth control pills can also be used to treat heavy periods.  Minipill This type of birth control pill contains only the progesterone hormone. They are taken every day of each month and must be prescribed by your health care  provider.  Birth control patch The patch contains hormones similar to those in birth control pills. It must be changed once a week and is prescribed by a health care provider.  Vaginal ring The ring contains hormones similar to those in birth control pills. It is left in the vagina for 3 weeks, removed for 1 week, and then a new one is put back in place. The patient must be comfortable inserting and removing the ring from the vagina.A health care provider's prescription is necessary.  Emergency contraception Emergency contraceptives prevent pregnancy after unprotected sexual intercourse. This pill can be taken right after sex or up to 5 days after unprotected sex. It is most effective the sooner you take the pills after having sexual intercourse. Most emergency contraceptive pills are available without a prescription. Check with your pharmacist. Do not use emergency contraception as your only form of birth control. BARRIER METHODS   Female condom This is a thin sheath (latex or rubber) that is worn over the penis during sexual intercourse. It can be used with spermicide to increase effectiveness.  Female condom. This is a soft, loose-fitting sheath that is put into the vagina before sexual intercourse.  Diaphragm This is a soft, latex, dome-shaped barrier that must be fitted by a health care provider. It is inserted into the vagina, along with a spermicidal jelly. It is inserted before intercourse. The diaphragm should be left in the vagina for 6 to 8 hours after intercourse.  Cervical cap This is a round, soft, latex or plastic cup  that fits over the cervix and must be fitted by a health care provider. The cap can be left in place for up to 48 hours after intercourse.  Sponge This is a soft, circular piece of polyurethane foam. The sponge has spermicide in it. It is inserted into the vagina after wetting it and before sexual intercourse.  Spermicides These are chemicals that kill or block sperm  from entering the cervix and uterus. They come in the form of creams, jellies, suppositories, foam, or tablets. They do not require a prescription. They are inserted into the vagina with an applicator before having sexual intercourse. The process must be repeated every time you have sexual intercourse. INTRAUTERINE CONTRACEPTION  Intrauterine device (IUD) This is a T-shaped device that is put in a woman's uterus during a menstrual period to prevent pregnancy. There are 2 types:  Copper IUD This type of IUD is wrapped in copper wire and is placed inside the uterus. Copper makes the uterus and fallopian tubes produce a fluid that kills sperm. It can stay in place for 10 years.  Hormone IUD This type of IUD contains the hormone progestin (synthetic progesterone). The hormone thickens the cervical mucus and prevents sperm from entering the uterus, and it also thins the uterine lining to prevent implantation of a fertilized egg. The hormone can weaken or kill the sperm that get into the uterus. It can stay in place for 3 5 years, depending on which type of IUD is used. PERMANENT METHODS OF CONTRACEPTION  Female tubal ligation This is when the woman's fallopian tubes are surgically sealed, tied, or blocked to prevent the egg from traveling to the uterus.  Hysteroscopic sterilization This involves placing a small coil or insert into each fallopian tube. Your doctor uses a technique called hysteroscopy to do the procedure. The device causes scar tissue to form. This results in permanent blockage of the fallopian tubes, so the sperm cannot fertilize the egg. It takes about 3 months after the procedure for the tubes to become blocked. You must use another form of birth control for these 3 months.  Female sterilization This is when the female has the tubes that carry sperm tied off (vasectomy).This blocks sperm from entering the vagina during sexual intercourse. After the procedure, the man can still ejaculate fluid  (semen). NATURAL PLANNING METHODS  Natural family planning This is not having sexual intercourse or using a barrier method (condom, diaphragm, cervical cap) on days the woman could become pregnant.  Calendar method This is keeping track of the length of each menstrual cycle and identifying when you are fertile.  Ovulation method This is avoiding sexual intercourse during ovulation.  Symptothermal method This is avoiding sexual intercourse during ovulation, using a thermometer and ovulation symptoms.  Post ovulation method This is timing sexual intercourse after you have ovulated. Regardless of which type or method of contraception you choose, it is important that you use condoms to protect against the transmission of sexually transmitted infections (STIs). Talk with your health care provider about which form of contraception is most appropriate for you. Document Released: 10/05/2005 Document Revised: 06/07/2013 Document Reviewed: 03/30/2013 [facility] Patient Information 2014 [employer].

## 2013-11-24 NOTE — Telephone Encounter (Signed)
Called pt to discuss result of wet prep, which showed signs of BV. Treatment options are limited because pt has a hx of allergy to flagyl (pruritic rash) and clindamycin (facial swelling). She does think she has tolerated metrogel in the past without difficulty. Will send in rx for metrogel. Advised that if she has any reaction or itching to come in immediately to be seen, and if she has facial swelling or difficulty breathing to go to the ER. Pt understood these instructions.

## 2013-11-25 NOTE — Assessment & Plan Note (Signed)
Gave handout on list of contraception options since pt is interested in switching to another form of birth control. Asked pt to f/u in the next month to discuss options further. POC pregnancy test obtained today due to irregular birth control use, and was negative.

## 2013-11-29 ENCOUNTER — Telehealth: Payer: Self-pay | Admitting: Family Medicine

## 2013-11-29 NOTE — Telephone Encounter (Signed)
Patient informed of message below.

## 2013-11-29 NOTE — Telephone Encounter (Signed)
[facility] red team, please call pt and inform her of the following:  1. Her gonorrhea and chlamydia tests were negative. 2. She left her appointment without getting her bloodwork drawn for HIV, syphilis, and hepatitis. She can schedule a lab visit to have these tests done. 3. She should follow up in one month as directed at her office visit.  Thanks! Leeanne Rio, MD

## 2013-11-29 NOTE — Telephone Encounter (Signed)
Attempted to call pt, unable to leave message.  Aunna Snooks, Loralyn Freshwater, [place]

## 2014-01-17 ENCOUNTER — Other Ambulatory Visit: Payer: Self-pay | Admitting: *Deleted

## 2014-01-17 NOTE — Telephone Encounter (Signed)
Request for 90 day supply. Renad Jenniges L, RN  

## 2014-01-18 ENCOUNTER — Telehealth: Payer: Self-pay | Admitting: Family Medicine

## 2014-01-18 MED ORDER — HYDROCHLOROTHIAZIDE 12.5 MG PO TABS: ORAL_TABLET | ORAL | Status: DC

## 2014-01-18 MED ORDER — NORGESTIMATE-ETH ESTRADIOL 0.25-35 MG-MCG PO TABS: ORAL_TABLET | ORAL | Status: DC

## 2014-01-18 NOTE — Telephone Encounter (Signed)
Called pt. Informed. Pt agreed. .Patryce Depriest  

## 2014-01-18 NOTE — Telephone Encounter (Signed)
To [facility] red team - please call pt and let her know I have refilled one month's worth of her HCTZ and birth control but she needs to schedule an appointment to follow up on what form of BC she wants as she had wanted to switch it before. She's also due for a physical. Thanks!  Leeanne Rio, MD

## 2014-02-15 ENCOUNTER — Other Ambulatory Visit: Payer: Self-pay | Admitting: Family Medicine

## 2014-02-15 NOTE — Telephone Encounter (Signed)
Covering Box for PCP  Refilled OCP X 1 month, pt was called and asked to follow up last month as her PCP was anticipating a change in her OCP.   Will ask nursing to call again.   Laroy Apple, MD [place] Resident, PGY-2 02/15/2014, 10:09 AM

## 2014-02-16 NOTE — Telephone Encounter (Signed)
LM on identifiable VM asking pt to please call and schedule an appointment with PCP before needs another refill on OCPs.   [place]

## 2014-03-21 ENCOUNTER — Telehealth: Payer: Self-pay | Admitting: Family Medicine

## 2014-03-21 MED ORDER — NORGESTIMATE-ETH ESTRADIOL 0.25-35 MG-MCG PO TABS: ORAL_TABLET | ORAL | Status: DC

## 2014-03-21 NOTE — Telephone Encounter (Signed)
Patient requesting refill for [facility] pills. She states she can only come to see Dr. Ardelia Mems on Fridays and has made an appt for 04/27/14 at 1:45 pm. But will need a refill of BC to get her to her appt. Please advise.

## 2014-03-21 NOTE — Telephone Encounter (Signed)
Rx sent in for another months' worth of OCP with 1 refill.

## 2014-03-21 NOTE — Addendum Note (Signed)
Addended by: Leeanne Rio on: 03/21/2014 06:09 PM   Modules accepted: Orders

## 2014-04-27 ENCOUNTER — Encounter: Payer: Self-pay | Admitting: Family Medicine

## 2014-04-27 ENCOUNTER — Other Ambulatory Visit (HOSPITAL_COMMUNITY)
Admission: RE | Admit: 2014-04-27 | Discharge: 2014-04-27 | Disposition: A | Discharge status: Home / Self Care | Payer: No Typology Code available for payment source | Source: Ambulatory Visit | Attending: Family Medicine | Admitting: Family Medicine

## 2014-04-27 ENCOUNTER — Ambulatory Visit: Payer: No Typology Code available for payment source | Admitting: Family Medicine

## 2014-04-27 ENCOUNTER — Ambulatory Visit (INDEPENDENT_AMBULATORY_CARE_PROVIDER_SITE_OTHER): Payer: No Typology Code available for payment source | Admitting: Family Medicine

## 2014-04-27 VITALS — BP 148/88 | HR 56 | Temp 98.0°F | Ht 61.0 in | Wt 136.0 lb

## 2014-04-27 DIAGNOSIS — N949 Unspecified condition associated with female genital organs and menstrual cycle: Secondary | ICD-10-CM

## 2014-04-27 DIAGNOSIS — R102 Pelvic and perineal pain: Secondary | ICD-10-CM

## 2014-04-27 DIAGNOSIS — Z3041 Encounter for surveillance of contraceptive pills: Secondary | ICD-10-CM

## 2014-04-27 DIAGNOSIS — Z113 Encounter for screening for infections with a predominantly sexual mode of transmission: Secondary | ICD-10-CM | POA: Insufficient documentation

## 2014-04-27 DIAGNOSIS — N889 Noninflammatory disorder of cervix uteri, unspecified: Secondary | ICD-10-CM

## 2014-04-27 DIAGNOSIS — J4520 Mild intermittent asthma, uncomplicated: Secondary | ICD-10-CM

## 2014-04-27 DIAGNOSIS — J45909 Unspecified asthma, uncomplicated: Secondary | ICD-10-CM | POA: Insufficient documentation

## 2014-04-27 DIAGNOSIS — I10 Essential (primary) hypertension: Secondary | ICD-10-CM

## 2014-04-27 LAB — POCT WET PREP (WET MOUNT): CLUE CELLS WET PREP WHIFF POC: NEGATIVE

## 2014-04-27 LAB — COMPREHENSIVE METABOLIC PANEL
ALBUMIN: 3.7 g/dL (ref 3.5–5.2)
ALT: <8 U/L (ref 0–35)
AST: 13 U/L (ref 0–37)
Alkaline Phosphatase: 25 U/L — ABNORMAL LOW (ref 39–117)
BUN: 8 mg/dL (ref 6–23)
CALCIUM: 8.3 mg/dL — AB (ref 8.4–10.5)
CHLORIDE: 103 meq/L (ref 96–112)
CO2: 22 meq/L (ref 19–32)
CREATININE: 0.72 mg/dL (ref 0.50–1.10)
GLUCOSE: 86 mg/dL (ref 70–99)
POTASSIUM: 3.8 meq/L (ref 3.5–5.3)
Sodium: 133 mEq/L — ABNORMAL LOW (ref 135–145)
Total Bilirubin: 0.7 mg/dL (ref 0.2–1.2)
Total Protein: 6.5 g/dL (ref 6.0–8.3)

## 2014-04-27 MED ORDER — BECLOMETHASONE DIPROPIONATE 40 MCG/ACT IN AERS: 1.0000 | INHALATION_SPRAY | Freq: Two times a day (BID) | RESPIRATORY_TRACT | Status: DC

## 2014-04-27 MED ORDER — HYDROCHLOROTHIAZIDE 12.5 MG PO TABS: ORAL_TABLET | ORAL | Status: DC

## 2014-04-27 MED ORDER — METRONIDAZOLE 0.75 % VA GEL
1.0000 | Freq: Every day | VAGINAL | Status: DC
Start: 2014-04-27 — End: 2014-07-02

## 2014-04-27 NOTE — Assessment & Plan Note (Signed)
BP elevated but out of HCTZ x 2 weeks. Will refill today and have pt f/u in a few weeks for BP recheck. Check CMET today (LFT's given reported abd pain).

## 2014-04-27 NOTE — Assessment & Plan Note (Addendum)
Abnormal white patches present on cervix today, largest appx 0.5cm in diameter at 12 oclock position above os. She also has generalized pelvic tenderness on exam. Last pap in 2013 showed no abnormal cells but transition zone notably ABSENT on pap. Plan: -schedule in GYN procedure clinic for colposcopy given grossly abnormal cervix -check pelvic u/s for pelvic tenderness -STD screening today - gc/chlam, wet prep, HIV, RPR, acute hepatitis panel -f/u with me in a few weeks  Precepted with Dr. Mingo Amber who also visualized cervix and agrees with this plan.

## 2014-04-27 NOTE — Progress Notes (Signed)
Patient ID: Sara Day, female   DOB: 08/15/1966, 48 y.o.   MRN: [id number]  HPI:  Vaginal discharge - has had discharge, no odor. No pelvic pain. Takes OCP, no problems with this but needs refill. LMP one week ago, gets monthly periods. Takes OCP every day. Sexually active with one female partner in the last year. Wants STD testing today. No hx of blood clots, migraine with aura. Nonsmoker. Has had lower abdominal pain for about one year. Has BM daily. No blood in stool, no weight loss.  HTN - out of HCTZ for the last couple of weeks. No chest pain but has hx of asthma which has flared recently leading to SOB (see below).  Asthma - Has had to use inhaler daily x 2 weeks with season change. No nasal congestion or fevers. SOB improves with albuterol. Has never been on a controller asthma medicine in the past.  ROS: See HPI  [place]: no family hx of cancers.  PHYSICAL EXAM: BP 148/88  Pulse 56  Temp(Src) 98 F (36.7 C) (Oral)  Ht 5\' 1"  (1.549 m)  Wt 136 lb (61.689 kg)  BMI 25.71 kg/m2  LMP 04/18/2014 Gen: NAD HEENT: NCAT Heart: RRR, no murmurs Lungs: CTAB, NWOB Abdomen: soft, mild TTP inferior to umbilicus. No peritoneal signs or guarding. Neuro: grossly nonfocal, speech normal Ext: No appreciable lower extremity edema bilaterally GU: normal appearing external genitalia without lesions. Vagina is moist with thin discharge. Cervix has several white patches which do not wipe off with swab. Largest of these is appx 0.5cm in diameter and at 12 o clock position above os. Fairly friable cervix, with pinpoint bleeding with just simple foxswab. No cervical motion tenderness but does have generalized pelvic tenderness with bimanual exam. No adnexal masses.   ASSESSMENT/PLAN:  # Health maintenance:  -due for mammogram and tetanus shot (last tetanus >10 years ago). Ran out of time to address this today, will do this at future visit. -STD screening: will check gc/chlamydia, wet prep, HIV, RPR,  acute hepatitis panel today   # Vaginal d/c - STD screening as above -wet prep shows some clue cells so will rx metrogel per pt request (cannot tolerate PO flagyl)  See problem based charting for additional assessment/plan.  FOLLOW UP: F/u in a few weeks for HTN, asthma, abd pain. Schedule GYN procedure visit for colposcopy.  [place]. Ardelia Mems, [place]

## 2014-04-27 NOTE — Assessment & Plan Note (Signed)
Flared recently, requiring daily albuterol x 2 weeks. No wheezes on exam today. Will start Qvar 91mcg 1 puff BID as controller medicine. F/u in a few weeks to evaluate improvement.

## 2014-04-27 NOTE — Patient Instructions (Addendum)
It was great to see you again today!  Pelvic symptoms: -We will call you with the results of your tests, or send you a letter. -we are also checking an ultrasound. -Schedule an appointment for a colposcopy in our clinic. You can schedule this at the front desk. -I refilled your birth control  Blood pressure: -refilled your medicine -come back in a few weeks to follow up on this and your abdominal pain  Breathing:  -start qvar twice a day, every day -use albuterol only as needed  Follow up in a few weeks for your abdominal pain and blood pressure.  Be well, Dr. Ardelia Mems

## 2014-04-27 NOTE — Assessment & Plan Note (Signed)
No contraindications to OCP use. Refilled today. Note that we are setting her up for pelvic u/s due to pelvic tenderness, and also for colposcopy given abnormal appearing cervix on exam today.

## 2014-04-28 LAB — HEPATITIS PANEL, ACUTE
HCV Ab: NEGATIVE
HEP B C IGM: NONREACTIVE
Hep A IgM: NONREACTIVE
Hepatitis B Surface Ag: NEGATIVE

## 2014-04-28 LAB — HIV ANTIBODY (ROUTINE TESTING W REFLEX): HIV: NONREACTIVE

## 2014-04-28 LAB — RPR

## 2014-05-03 ENCOUNTER — Ambulatory Visit (INDEPENDENT_AMBULATORY_CARE_PROVIDER_SITE_OTHER): Payer: No Typology Code available for payment source | Admitting: Family Medicine

## 2014-05-03 ENCOUNTER — Other Ambulatory Visit (HOSPITAL_COMMUNITY)
Admission: RE | Admit: 2014-05-03 | Discharge: 2014-05-03 | Disposition: A | Discharge status: Home / Self Care | Payer: No Typology Code available for payment source | Source: Ambulatory Visit | Attending: Family Medicine | Admitting: Family Medicine

## 2014-05-03 VITALS — BP 145/82 | HR 62 | Temp 98.2°F | Wt 135.2 lb

## 2014-05-03 DIAGNOSIS — Z01419 Encounter for gynecological examination (general) (routine) without abnormal findings: Secondary | ICD-10-CM | POA: Insufficient documentation

## 2014-05-03 DIAGNOSIS — Z1151 Encounter for screening for human papillomavirus (HPV): Secondary | ICD-10-CM | POA: Insufficient documentation

## 2014-05-03 DIAGNOSIS — N889 Noninflammatory disorder of cervix uteri, unspecified: Secondary | ICD-10-CM

## 2014-05-03 NOTE — Patient Instructions (Signed)
The test results should come back by next week. If they are normal you can expect a letter in the mail. If they are abnormal or need to be discussed someone from clinic will call you. You can expect a small amount of bleeding and discomfort for the next day.  Colposcopy, Care After Refer to this sheet in the next few weeks. These instructions provide you with information on caring for yourself after your procedure. Your health care provider may also give you more specific instructions. Your treatment has been planned according to current medical practices, but problems sometimes occur. Call your health care provider if you have any problems or questions after your procedure. WHAT TO EXPECT AFTER THE PROCEDURE  After your procedure, it is typical to have the following:  Cramping. This often goes away in a few minutes.  Soreness. This may last for 2 days.  Lightheadedness. Lie down for a few minutes if this occurs. You may also have some bleeding or dark discharge for a few days. You may need to wear a sanitary pad during this time. HOME CARE INSTRUCTIONS  Avoid sex, douching, and using tampons for 3 days or as directed by your health care provider.  Only take over-the-counter or prescription medicines as directed by your health care provider. Do not take aspirin because it can cause bleeding.  Continue to take birth control pills if you are on them.  Not all test results are available during your visit. If your test results are not back during the visit, make an appointment with your health care provider to find out the results. Do not assume everything is normal if you have not heard from your health care provider or the medical facility. It is important for you to follow up on all of your test results.  Follow your health care provider's advice regarding activity, follow-up visits, and follow-up Pap tests. SEEK MEDICAL CARE IF:  You develop a rash.  You have problems with your  medicine. SEEK IMMEDIATE MEDICAL CARE IF:  You are bleeding heavily or are passing blood clots.  You have a fever.  You have abnormal vaginal discharge.  You are having cramps that do not go away after taking your pain medicine.  You feel lightheaded, dizzy, or faint.  You have stomach pain. Document Released: 07/26/2013 Document Reviewed: 07/26/2013 [facility] Patient Information 2015 [place], [place]. This information is not intended to replace advice given to you by your health care provider. Make sure you discuss any questions you have with your health care provider.   Colposcopy Colposcopy is a procedure to examine your cervix and vagina, or the area around the outside of your vagina, for abnormalities or signs of disease. The procedure is done using a lighted microscope called a colposcope. Tissue samples may be collected during the colposcopy if your health care provider finds any unusual cells. A colposcopy may be done if a woman has:  An abnormal Pap test. A Pap test is a medical test done to evaluate cells that are on the surface of the cervix.  A Pap test result that is suggestive of human papillomavirus (HPV). This virus can cause genital warts and is linked to the development of cervical cancer.  A sore on her cervix and the results of a Pap test were normal.  Genital warts on the cervix or in or around the outside of the vagina.  A mother who took the drug diethylstilbestrol (DES) while pregnant.  Painful intercourse.  Vaginal bleeding, especially after  sexual intercourse. LET [facility] CARE PROVIDER KNOW ABOUT:  Any allergies you have.  All medicines you are taking, including vitamins, herbs, eye drops, creams, and over-the-counter medicines.  Previous problems you or members of your family have had with the use of anesthetics.  Any blood disorders you have.  Previous surgeries you have had.  Medical conditions you have. RISKS AND COMPLICATIONS Generally,  a colposcopy is a safe procedure. However, as with any procedure, complications can occur. Possible complications include:  Bleeding.  Infection.  Missed lesions. BEFORE THE PROCEDURE   Tell your health care provider if you have your menstrual period. A colposcopy typically is not done during menstruation.  For 24 hours before the colposcopy, do not:  [place].  Use tampons.  Use medicines, creams, or suppositories in the vagina.  Have sexual intercourse. PROCEDURE  During the procedure, you will be lying on your back with your feet in foot rests (stirrups). A warm metal or plastic instrument (speculum) will be placed in your vagina to keep it open and to allow the health care provider to see the cervix. The colposcope will be placed outside the vagina. It will be used to magnify and examine the cervix, vagina, and the area around the outside of the vagina. A small amount of liquid solution will be placed on the area that is to be viewed. This solution will make it easier to see the abnormal cells. Your health care provider will use tools to suck out mucus and cells from the canal of the cervix. Then he or she will record the location of the abnormal areas. If a biopsy is done during the procedure, a medicine will usually be given to numb the area (local anesthetic). You may feel mild pain or cramping while the biopsy is done. After the procedure, tissue samples collected during the biopsy will be sent to a lab for analysis. AFTER THE PROCEDURE  You will be given instructions on when to follow up with your health care provider for your test results. It is important to keep your appointment. Document Released: 12/26/2002 Document Revised: 06/07/2013 Document Reviewed: 05/04/2013 [facility] Patient Information 2015 [place], [place]. This information is not intended to replace advice given to you by your health care provider. Make sure you discuss any questions you have with your health care  provider.

## 2014-05-03 NOTE — Assessment & Plan Note (Signed)
Pap smear and biopsy performed today. F/u pathology results.

## 2014-05-03 NOTE — Progress Notes (Signed)
Patient ID: Sara Day, female   DOB: 05/06/1966, 48 y.o.   MRN: [id number]   Subjective:   Seen in [place] clinic  HPI  CC: cervical lesion seen on speculum exam  # Cervical lesion:  Referral for colpo from Dr. Ardelia Mems for suspicious lesion  Most recent pap smear in 2013 was neg for atypical cells but lacked the transformation zone  Had period last week, bleeding has stopped. On OCP. ROS: no vaginal bleeding, discharge  Review of Systems   See HPI for ROS. Objective:  BP 145/82  Pulse 62  Temp(Src) 98.2 F (36.8 C)  Wt 135 lb 3.2 oz (61.326 kg)  LMP 04/18/2014  General: NAD GU: normal external genitalia, NAVM, cervix description below. Pap smear also done.  Patient given informed consent, signed copy in the chart.  Placed in lithotomy position. Cervix viewed with speculum and colposcope after application of acetic acid.   Colposcopy adequate (entire squamocolumnar junctions seen  in entirety) ?  Yes Acetowhite lesions?Yes, 5 o'clock Punctation?No Mosaicism?  No Abnormal vasculature?  Yes, 5 o'clock, 12 o'clock Biopsies?Yes ECC?No Complications? None  COMMENTS: Patient was given post procedure instructions.    Assessment & Plan:  See Problem List Documentation

## 2014-05-03 NOTE — Progress Notes (Addendum)
[place] ATTENDING  NOTE Monesha Monreal,MD I  have seen and examined this patient, reviewed their chart. I have discussed this patient with the resident. I agree with the resident's findings, assessment and care plan.  Repeat PAP recommended since the initial was without the transformation zone. Abnormal vasculature noted on  5 O'clock and few at 12 O'clock. Colposcopy and biopsy of this spot was taken. We will call patient with all test results.

## 2014-05-04 LAB — CYTOLOGY - PAP

## 2014-05-07 ENCOUNTER — Ambulatory Visit (HOSPITAL_COMMUNITY): Admission: RE | Admit: 2014-05-07 | Payer: No Typology Code available for payment source | Source: Ambulatory Visit

## 2014-05-07 ENCOUNTER — Encounter: Payer: Self-pay | Admitting: Family Medicine

## 2014-05-07 ENCOUNTER — Telehealth: Payer: Self-pay | Admitting: Family Medicine

## 2014-05-07 DIAGNOSIS — N870 Mild cervical dysplasia: Secondary | ICD-10-CM | POA: Insufficient documentation

## 2014-05-07 NOTE — Telephone Encounter (Signed)
I called to discuss test result with patient. Her phone message box is full hence I was not able to leave a message. I will forward message to PCP to discuss at follow up and also mail letter to patient.  Result: Normal PAP with + transitional zone. Cervical Biopsy showed Cervix, biopsy, 5 o'clock - LOW GRADE SQUAMOUS INTRAEPITHELIAL LESION, CIN-I (MILD DYSPLASIA).  I will recommend repeat co-testing in 1 yr. May consider repeat pelvic exam in 6 months.  Patient later called back and I relayed result and follow up plan with her. She verbalized understanding and agreed with plan.

## 2014-05-19 ENCOUNTER — Encounter (HOSPITAL_COMMUNITY): Payer: Self-pay | Admitting: Emergency Medicine

## 2014-05-19 ENCOUNTER — Emergency Department (HOSPITAL_COMMUNITY)
Admission: EM | Admit: 2014-05-19 | Discharge: 2014-05-19 | Disposition: A | Discharge status: Home / Self Care | Payer: No Typology Code available for payment source | Source: Home / Self Care | Attending: Emergency Medicine | Admitting: Emergency Medicine

## 2014-05-19 DIAGNOSIS — L309 Dermatitis, unspecified: Secondary | ICD-10-CM

## 2014-05-19 DIAGNOSIS — L259 Unspecified contact dermatitis, unspecified cause: Secondary | ICD-10-CM

## 2014-05-19 MED ORDER — TRIAMCINOLONE ACETONIDE 0.1 % MT PSTE: PASTE | OROMUCOSAL | Status: DC

## 2014-05-19 MED ORDER — TRIAMCINOLONE ACETONIDE 0.1 % EX CREA: 1.0000 "application " | TOPICAL_CREAM | Freq: Three times a day (TID) | CUTANEOUS | Status: DC

## 2014-05-19 MED ORDER — HYDROXYZINE HCL 25 MG PO TABS: 25.0000 mg | ORAL_TABLET | Freq: Three times a day (TID) | ORAL | Status: DC | PRN

## 2014-05-19 MED ORDER — METHYLPREDNISOLONE ACETATE 80 MG/ML IJ SUSP
INTRAMUSCULAR | Status: AC
  Filled 2014-05-19: qty 1

## 2014-05-19 MED ORDER — PREDNISONE 20 MG PO TABS: ORAL_TABLET | ORAL | Status: DC

## 2014-05-19 MED ORDER — METHYLPREDNISOLONE ACETATE 80 MG/ML IJ SUSP
80.0000 mg | Freq: Once | INTRAMUSCULAR | Status: AC
  Administered 2014-05-19: 80 mg via INTRAMUSCULAR

## 2014-05-19 NOTE — Discharge Instructions (Signed)

## 2014-05-19 NOTE — ED Provider Notes (Signed)
  Chief Complaint    Chief Complaint  Patient presents with  . Poison Ivy    History of Present Illness      Sara Day is a 48 year old female who has an extensive rash on her arms, chest, abdomen, thighs, and back. The patient states she was exposed to poison ivy 2 weeks ago and this is how all started out. The rash is very itchy. She denies any difficulty breathing, wheezing, or swelling of lips, tongue, or throat.  Review of Systems   Other than as noted above, the patient denies any of the following symptoms: Systemic:  No fever or chills. ENT:  No nasal congestion, rhinorrhea, sore throat, swelling of lips, tongue or throat. Resp:  No cough, wheezing, or shortness of breath.  [place]    Past medical history, family history, social history, meds, and allergies were reviewed. She's allergic to codeine. She takes birth control pills and a blood pressure medication. She has high blood pressure.  Physical Exam     Vital signs:  BP 139/82  Pulse 58  Temp(Src) 98.4 F (36.9 C) (Oral)  SpO2 100%  LMP 04/18/2014 Gen:  Alert, oriented, in no distress. ENT:  Pharynx clear, no intraoral lesions, moist mucous membranes. Lungs:  Clear to auscultation. Skin:  She has multiple eczematous papules on her arms, chest, abdomen, and thighs. There no streaks or patches, and no blistering.  Course in Urgent [place]     The following meds were given:  Medications  methylPREDNISolone acetate (DEPO-MEDROL) injection 80 mg (80 mg Intramuscular Given 05/19/14 1732)   Assessment    The encounter diagnosis was Dermatitis.  This does not appear to be classical poison ivy. It could be contact dermatitis or eczema.  Plan     1.  Meds:  The following meds were prescribed:   Discharge Medication List as of 05/19/2014  5:22 PM    START taking these medications   Details  hydrOXYzine (ATARAX/VISTARIL) 25 MG tablet Take 1 tablet (25 mg total) by mouth every 8 (eight) hours as needed for  itching., Starting 05/19/2014, Until Discontinued, Normal    predniSONE (DELTASONE) 20 MG tablet Take 3 daily for 5 days, 2 daily for 5 days, 1 daily for 5 days., Normal    triamcinolone (KENALOG) 0.1 % paste Apply to sore in mouth 3 times daily, Normal    triamcinolone cream (KENALOG) 0.1 % Apply 1 application topically 3 (three) times daily., Starting 05/19/2014, Until Discontinued, Normal        2.  Patient Education/Counseling:  The patient was given appropriate handouts, self care instructions, and instructed in symptomatic relief.    3.  Follow up:  The patient was told to follow up here if no better in 3 to 4 days, or sooner if becoming worse in any way, and given some red flag symptoms such as worsening rash, fever, or difficulty breathing which would prompt immediate return.  Follow up with a dermatologist if no better in a week.      Harden Mo, MD 05/19/14 2114

## 2014-05-19 NOTE — ED Notes (Addendum)
C/o poison ivy to both arms onset 2 weeks ago, after working in yard.  She tried benadryl, calamine lotion, hydrocortisone cream, Tecnu scrub and Ivarest for [employer] without relief. Has spread to breast, buttocks, and legs.  C/o itching all over.

## 2014-05-25 ENCOUNTER — Ambulatory Visit (HOSPITAL_COMMUNITY): Admission: RE | Admit: 2014-05-25 | Payer: No Typology Code available for payment source | Source: Ambulatory Visit

## 2014-07-02 ENCOUNTER — Emergency Department (HOSPITAL_COMMUNITY): Payer: No Typology Code available for payment source

## 2014-07-02 ENCOUNTER — Emergency Department (HOSPITAL_COMMUNITY)
Admission: EM | Admit: 2014-07-02 | Discharge: 2014-07-02 | Disposition: A | Discharge status: Home / Self Care | Payer: No Typology Code available for payment source | Attending: Emergency Medicine | Admitting: Emergency Medicine

## 2014-07-02 ENCOUNTER — Encounter (HOSPITAL_COMMUNITY): Payer: Self-pay | Admitting: Emergency Medicine

## 2014-07-02 DIAGNOSIS — R110 Nausea: Secondary | ICD-10-CM | POA: Insufficient documentation

## 2014-07-02 DIAGNOSIS — I10 Essential (primary) hypertension: Secondary | ICD-10-CM | POA: Insufficient documentation

## 2014-07-02 DIAGNOSIS — R079 Chest pain, unspecified: Secondary | ICD-10-CM | POA: Diagnosis present

## 2014-07-02 DIAGNOSIS — J45909 Unspecified asthma, uncomplicated: Secondary | ICD-10-CM | POA: Diagnosis not present

## 2014-07-02 DIAGNOSIS — R071 Chest pain on breathing: Secondary | ICD-10-CM | POA: Insufficient documentation

## 2014-07-02 DIAGNOSIS — Z79899 Other long term (current) drug therapy: Secondary | ICD-10-CM | POA: Insufficient documentation

## 2014-07-02 LAB — BASIC METABOLIC PANEL
ANION GAP: 13 (ref 5–15)
BUN: 13 mg/dL (ref 6–23)
CALCIUM: 8.2 mg/dL — AB (ref 8.4–10.5)
CO2: 22 meq/L (ref 19–32)
CREATININE: 0.73 mg/dL (ref 0.50–1.10)
Chloride: 104 mEq/L (ref 96–112)
GFR calc Af Amer: >90 mL/min (ref >90)
GFR calc non Af Amer: >90 mL/min (ref >90)
Glucose, Bld: 92 mg/dL (ref 70–99)
Potassium: 4.1 mEq/L (ref 3.7–5.3)
Sodium: 139 mEq/L (ref 137–147)

## 2014-07-02 LAB — CBC
HCT: 35.1 % — ABNORMAL LOW (ref 36.0–46.0)
Hemoglobin: 11.4 g/dL — ABNORMAL LOW (ref 12.0–15.0)
MCH: 28.6 pg (ref 26.0–34.0)
MCHC: 32.5 g/dL (ref 30.0–36.0)
MCV: 88.2 fL (ref 78.0–100.0)
PLATELETS: 328 10*3/uL (ref 150–400)
RBC: 3.98 MIL/uL (ref 3.87–5.11)
RDW: 13.4 % (ref 11.5–15.5)
WBC: 9 10*3/uL (ref 4.0–10.5)

## 2014-07-02 LAB — I-STAT TROPONIN, ED
TROPONIN I, POC: 0.01 ng/mL (ref 0.00–0.08)
Troponin i, poc: 0 ng/mL (ref 0.00–0.08)

## 2014-07-02 LAB — PRO B NATRIURETIC PEPTIDE: Pro B Natriuretic peptide (BNP): 37.6 pg/mL (ref 0–125)

## 2014-07-02 MED ORDER — NITROGLYCERIN 2 % TD OINT
1.0000 [in_us] | TOPICAL_OINTMENT | Freq: Once | TRANSDERMAL | Status: AC
  Administered 2014-07-02: 1 [in_us] via TOPICAL
  Filled 2014-07-02: qty 1

## 2014-07-02 MED ORDER — KETOROLAC TROMETHAMINE 30 MG/ML IJ SOLN
30.0000 mg | Freq: Once | INTRAMUSCULAR | Status: DC
  Filled 2014-07-02: qty 1

## 2014-07-02 NOTE — Discharge Instructions (Signed)
You were seen today for chest pain.  Your work-up has been reassuring.  Because your chest hurts with pressure applied, you may have some element of chest wall pain.  However, given your age and history of hypertension, would have you follow-up closely with cardiology.  Chest Pain (Nonspecific) It is often hard to give a specific diagnosis for the cause of chest pain. There is always a chance that your pain could be related to something serious, such as a heart attack or a blood clot in the lungs. You need to follow up with your health care provider for further evaluation. CAUSES   Heartburn.  Pneumonia or bronchitis.  Anxiety or stress.  Inflammation around your heart (pericarditis) or lung (pleuritis or pleurisy).  A blood clot in the lung.  A collapsed lung (pneumothorax). It can develop suddenly on its own (spontaneous pneumothorax) or from trauma to the chest.  Shingles infection (herpes zoster virus). The chest wall is composed of bones, muscles, and cartilage. Any of these can be the source of the pain.  The bones can be bruised by injury.  The muscles or cartilage can be strained by coughing or overwork.  The cartilage can be affected by inflammation and become sore (costochondritis). DIAGNOSIS  Lab tests or other studies may be needed to find the cause of your pain. Your health care provider may have you take a test called an ambulatory electrocardiogram (ECG). An ECG records your heartbeat patterns over a 24-hour period. You may also have other tests, such as:  Transthoracic echocardiogram (TTE). During echocardiography, sound waves are used to evaluate how blood flows through your heart.  Transesophageal echocardiogram (TEE).  Cardiac monitoring. This allows your health care provider to monitor your heart rate and rhythm in real time.  Holter monitor. This is a portable device that records your heartbeat and can help diagnose heart arrhythmias. It allows your health care  provider to track your heart activity for several days, if needed.  Stress tests by exercise or by giving medicine that makes the heart beat faster. TREATMENT   Treatment depends on what may be causing your chest pain. Treatment may include:  Acid blockers for heartburn.  Anti-inflammatory medicine.  Pain medicine for inflammatory conditions.  Antibiotics if an infection is present.  You may be advised to change lifestyle habits. This includes stopping smoking and avoiding alcohol, caffeine, and chocolate.  You may be advised to keep your head raised (elevated) when sleeping. This reduces the chance of acid going backward from your stomach into your esophagus. Most of the time, nonspecific chest pain will improve within 2-3 days with rest and mild pain medicine.  HOME CARE INSTRUCTIONS   If antibiotics were prescribed, take them as directed. Finish them even if you start to feel better.  For the next few days, avoid physical activities that bring on chest pain. Continue physical activities as directed.  Do not use any tobacco products, including cigarettes, chewing tobacco, or electronic cigarettes.  Avoid drinking alcohol.  Only take medicine as directed by your health care provider.  Follow your health care provider's suggestions for further testing if your chest pain does not go away.  Keep any follow-up appointments you made. If you do not go to an appointment, you could develop lasting (chronic) problems with pain. If there is any problem keeping an appointment, call to reschedule. SEEK MEDICAL CARE IF:   Your chest pain does not go away, even after treatment.  You have a rash with blisters  on your chest.  You have a fever. SEEK IMMEDIATE MEDICAL CARE IF:   You have increased chest pain or pain that spreads to your arm, neck, jaw, back, or abdomen.  You have shortness of breath.  You have an increasing cough, or you cough up blood.  You have severe back or  abdominal pain.  You feel nauseous or vomit.  You have severe weakness.  You faint.  You have chills. This is an emergency. Do not wait to see if the pain will go away. Get medical help at once. Call your local emergency services (911 in [place] Do not drive yourself to the hospital. MAKE SURE YOU:   Understand these instructions.  Will watch your condition.  Will get help right away if you are not doing well or get worse. Document Released: 07/15/2005 Document Revised: 10/10/2013 Document Reviewed: 05/10/2008 [facility] Patient Information 2015 [place], [place]. This information is not intended to replace advice given to you by your health care provider. Make sure you discuss any questions you have with your health care provider.

## 2014-07-02 NOTE — ED Notes (Signed)
Attempted to stick patient for IV start, pt pulled away hard from this RN and blew IV, will not reattempt unless IV in necessary for pt.

## 2014-07-02 NOTE — ED Provider Notes (Signed)
CSN: [id number]     Arrival date & time 07/02/14  0009 History   First MD Initiated Contact with Patient 07/02/14 0018     Chief Complaint  Patient presents with  . Chest Pain     (Consider location/radiation/quality/duration/timing/severity/associated sxs/prior Treatment) HPI  This is a 48 year old female who presents with chest pain. Patient reports onset of pressure-like chest pain at approximately 10:30 PM. She rates it at 5/10. She took an aspirin prior to arrival without any relief. She states that she was sitting studying when she noted the pain. She denies any shortness of breath or diaphoresis. She does report nausea without vomiting. She has a history of hypertension and early family history of heart disease. She reports that she's had pain like this in the past. She denies any leg swelling. Pain is not worse with breathing. It is worse with movement.  Past Medical History  Diagnosis Date  . Asthma   . Hypertension    History reviewed. No pertinent past surgical history. Family History  Problem Relation Age of Onset  . Heart attack Father    History  Substance Use Topics  . Smoking status: Never Smoker   . Smokeless tobacco: Never Used  . Alcohol Use: Yes     Comment: occasional   OB History   Grav Para Term Preterm Abortions TAB SAB Ect Mult Living                 Review of Systems  Constitutional: Negative for fever.  Respiratory: Positive for chest tightness. Negative for cough and shortness of breath.   Cardiovascular: Negative for chest pain, palpitations and leg swelling.  Gastrointestinal: Positive for nausea. Negative for vomiting and abdominal pain.  Genitourinary: Negative for dysuria.  Skin: Negative for rash.  Neurological: Negative for headaches.  All other systems reviewed and are negative.     Allergies  Clindamycin/lincomycin; Codeine; Metronidazole; Sulfa antibiotics; and Sulfamethoxazole  Home Medications   Prior to Admission  medications   Medication Sig Start Date End Date Taking? Authorizing Provider  albuterol (PROVENTIL HFA) 108 (90 BASE) MCG/ACT inhaler Inhale 2 puffs into the lungs every 4 (four) hours as needed for wheezing. 11/24/13  Yes Leeanne Rio, MD  hydrochlorothiazide (MICROZIDE) 12.5 MG capsule Take 12.5 mg by mouth daily.   Yes Historical Provider, MD  norgestimate-ethinyl estradiol (ORTHO-CYCLEN,SPRINTEC,PREVIFEM) 0.25-35 MG-MCG tablet Take 1 tablet by mouth daily.   Yes Historical Provider, MD   BP 118/76  Pulse 63  Temp(Src) 98.4 F (36.9 C) (Oral)  Resp 18  Ht 5\' 1"  (1.549 m)  Wt 140 lb (63.504 kg)  BMI 26.47 kg/m2  SpO2 100%  LMP 06/27/2014 Physical Exam  Nursing note and vitals reviewed. Constitutional: She is oriented to person, place, and time. She appears well-developed and well-nourished. No distress.  HENT:  Head: Normocephalic and atraumatic.  Eyes: Pupils are equal, round, and reactive to light.  Neck: Neck supple.  Cardiovascular: Normal rate, regular rhythm and normal heart sounds.   No murmur heard. Pulmonary/Chest: Effort normal. No respiratory distress. She has no wheezes. She exhibits tenderness.  Tenderness palpation of the anterior chest wall which reproduces patient's pain.  Abdominal: Soft. Bowel sounds are normal. There is no tenderness. There is no rebound.  Musculoskeletal: She exhibits no edema.  Neurological: She is alert and oriented to person, place, and time.  Skin: Skin is warm and dry.  Psychiatric: She has a normal mood and affect.    ED Course  Procedures (including critical  care time) Labs Review Labs Reviewed  CBC - Abnormal; Notable for the following:    Hemoglobin 11.4 (*)    HCT 35.1 (*)    All other components within normal limits  BASIC METABOLIC PANEL - Abnormal; Notable for the following:    Calcium 8.2 (*)    All other components within normal limits  PRO B NATRIURETIC PEPTIDE  I-STAT TROPOININ, ED  Randolm Idol, ED     Imaging Review Dg Chest Port 1 View  07/02/2014   CLINICAL DATA:  Mid chest pain.  EXAM: PORTABLE CHEST - 1 VIEW  COMPARISON:  01/21/2012  FINDINGS: The heart size and mediastinal contours are within normal limits. Both lungs are clear. The visualized skeletal structures are unremarkable.  IMPRESSION: No active disease.   Electronically Signed   By: Shon Hale M.D.   On: 07/02/2014 01:17     EKG Interpretation   Date/Time:  Monday July 02 2014 00:15:30 EDT Ventricular Rate:  59 PR Interval:  150 QRS Duration: 70 QT Interval:  416 QTC Calculation: 411 R Axis:   72 Text Interpretation:  Sinus bradycardia Unchanged from prior Confirmed by  HORTON  MD, COURTNEY ([id number]) on 07/02/2014 12:18:22 AM     Medications  ketorolac (TORADOL) 30 MG/ML injection 30 mg (30 mg Intravenous Not Given 07/02/14 0209)  nitroGLYCERIN (NITROGLYN) 2 % ointment 1 inch (1 inch Topical Given 07/02/14 0100)    MDM   Final diagnoses:  Chest pain, unspecified chest pain type   Patient presents with chest pain. Onset prior to arrival.  No history of heart disease. Risk factors include hypertension and family history.  EKG is reassuring and unchanged from prior. Chest x-ray shows no evidence of pneumothorax or pneumonia. Patient does have reproducible pain on exam which is somewhat suggestive of musculoskeletal etiology. Heart score is 2 with a normal troponin.  Patient was initially given nitroglycerin without improvement of pain. She was subsequently given Toradol with improvement of her pain. My suspicion is that her pain is musculoskeletal in etiology. Delta troponin is negative.  Will have her follow up closely with cardiology for further evaluation given her risk factors.  Patient was advised negative workup and stated understanding. She will followup closely. She was given strict return precautions.  Merryl Hacker, MD 07/02/14 [phone]

## 2014-07-02 NOTE — ED Notes (Signed)
C/o tightness in center of chest x 30 min that radiates to back with sob.  Reports nausea and vomiting x 2 earlier today.

## 2014-07-03 ENCOUNTER — Other Ambulatory Visit: Payer: Self-pay | Admitting: Family Medicine

## 2014-07-05 ENCOUNTER — Telehealth: Payer: Self-pay | Admitting: Family Medicine

## 2014-07-05 ENCOUNTER — Ambulatory Visit (INDEPENDENT_AMBULATORY_CARE_PROVIDER_SITE_OTHER): Payer: No Typology Code available for payment source | Admitting: Family Medicine

## 2014-07-05 ENCOUNTER — Other Ambulatory Visit (HOSPITAL_COMMUNITY)
Admission: RE | Admit: 2014-07-05 | Discharge: 2014-07-05 | Disposition: A | Discharge status: Home / Self Care | Payer: No Typology Code available for payment source | Source: Ambulatory Visit | Attending: Family Medicine | Admitting: Family Medicine

## 2014-07-05 ENCOUNTER — Encounter: Payer: Self-pay | Admitting: Family Medicine

## 2014-07-05 VITALS — BP 137/73 | HR 61 | Temp 98.0°F | Ht 61.0 in | Wt 140.0 lb

## 2014-07-05 DIAGNOSIS — N898 Other specified noninflammatory disorders of vagina: Secondary | ICD-10-CM

## 2014-07-05 DIAGNOSIS — N760 Acute vaginitis: Secondary | ICD-10-CM

## 2014-07-05 DIAGNOSIS — Z113 Encounter for screening for infections with a predominantly sexual mode of transmission: Secondary | ICD-10-CM | POA: Diagnosis present

## 2014-07-05 LAB — POCT WET PREP (WET MOUNT): Clue Cells Wet Prep Whiff POC: POSITIVE

## 2014-07-05 MED ORDER — FLUCONAZOLE 150 MG PO TABS: 150.0000 mg | ORAL_TABLET | Freq: Every day | ORAL | Status: DC

## 2014-07-05 MED ORDER — METRONIDAZOLE 0.75 % VA GEL: 1.0000 | Freq: Two times a day (BID) | VAGINAL | Status: DC

## 2014-07-05 NOTE — Patient Instructions (Signed)
Bacterial Vaginosis Bacterial vaginosis is a vaginal infection that occurs when the normal balance of bacteria in the vagina is disrupted. It results from an overgrowth of certain bacteria. This is the most common vaginal infection in women of childbearing age. Treatment is important to prevent complications, especially in pregnant women, as it can cause a premature delivery. CAUSES  Bacterial vaginosis is caused by an increase in harmful bacteria that are normally present in smaller amounts in the vagina. Several different kinds of bacteria can cause bacterial vaginosis. However, the reason that the condition develops is not fully understood. RISK FACTORS Certain activities or behaviors can put you at an increased risk of developing bacterial vaginosis, including:  Having a new sex partner or multiple sex partners.  Douching.  Using an intrauterine device (IUD) for contraception. Women do not get bacterial vaginosis from toilet seats, bedding, swimming pools, or contact with objects around them. SIGNS AND SYMPTOMS  Some women with bacterial vaginosis have no signs or symptoms. Common symptoms include:  Grey vaginal discharge.  A fishlike odor with discharge, especially after sexual intercourse.  Itching or burning of the vagina and vulva.  Burning or pain with urination. DIAGNOSIS  Your health care provider will take a medical history and examine the vagina for signs of bacterial vaginosis. A sample of vaginal fluid may be taken. Your health care provider will look at this sample under a microscope to check for bacteria and abnormal cells. A vaginal pH test may also be done.  TREATMENT  Bacterial vaginosis may be treated with antibiotic medicines. These may be given in the form of a pill or a vaginal cream. A second round of antibiotics may be prescribed if the condition comes back after treatment.  HOME CARE INSTRUCTIONS   Only take over-the-counter or prescription medicines as  directed by your health care provider.  If antibiotic medicine was prescribed, take it as directed. Make sure you finish it even if you start to feel better.  Do not have sex until treatment is completed.  Tell all sexual partners that you have a vaginal infection. They should see their health care provider and be treated if they have problems, such as a mild rash or itching.  Practice safe sex by using condoms and only having one sex partner. SEEK MEDICAL CARE IF:   Your symptoms are not improving after 3 days of treatment.  You have increased discharge or pain.  You have a fever. MAKE SURE YOU:   Understand these instructions.  Will watch your condition.  Will get help right away if you are not doing well or get worse. FOR MORE INFORMATION  Centers for Disease Control and Prevention, Division of STD Prevention: www.cdc.gov/std American Sexual Health Association (ASHA): www.ashastd.org  Document Released: 10/05/2005 Document Revised: 07/26/2013 Document Reviewed: 05/17/2013 ExitCare Patient Information 2015 [employer]. This information is not intended to replace advice given to you by your health care provider. Make sure you discuss any questions you have with your health care provider.  

## 2014-07-05 NOTE — Telephone Encounter (Signed)
Call pt and inform her she had a yeast and BV infection. I have called in diflucan and metro gel, we call her with the other results as they become available Monday. Thanks

## 2014-07-05 NOTE — Assessment & Plan Note (Addendum)
Wet prep with  yeast spore and many clues. gonorrhea and Chlamydia sent today. Diflucan and metrogel prescribed (patient tolerated gel prior, "allergic" to flagyl and clinda) Will call with G/C results, if positive would send for [place].

## 2014-07-05 NOTE — Progress Notes (Signed)
   Subjective:    Patient ID: Sara Day, female    DOB: 02/25/1966, 48 y.o.   MRN: [id number]  HPI Sara Day is a 48 y.o. female presents to [facility]  Vaginal discharge: Patient reports abdominal cramping for 2 weeks with yellow vaginal discharge for 3 weeks. She has noticed some "clots" in her discharge. She states there is a minimal odor. She denies vaginal itching, burning with, urinary frequency or dysuria. She does not have frequent infections, but has had bacterial vaginosis in the past. She is not diabetic. She is monogamous, with the same partner for 15 years. Patient is allergic to Flagyl and clindamycin.   Review of Systems Per HPI    Objective:   Physical Exam BP 137/73  Pulse 61  Temp(Src) 98 F (36.7 C) (Oral)  Ht 5\' 1"  (1.549 m)  Wt 140 lb (63.504 kg)  BMI 26.47 kg/m2  LMP 06/27/2014 Gen: Doesn't, African American female, well-developed, well-nourished, no acute distress, nontoxic appearance Abd: Soft. Flat. Mildly tender to palpation suprapubic area. ND. BS present. No Masses palpated.   GYN:  External genitalia within normal limits.  Vaginal mucosa pink, moist, normal rugae.  Nonfriable cervix without lesions, bloody/yellow discharge noted on speculum exam.  Bimanual exam revealed normal, nongravid uterus.  Mild cervical motion tenderness. No adnexal masses bilaterally.          Assessment & Plan:

## 2014-07-06 ENCOUNTER — Telehealth: Payer: Self-pay | Admitting: Family Medicine

## 2014-07-06 LAB — CERVICOVAGINAL ANCILLARY ONLY
Chlamydia: NEGATIVE
Neisseria Gonorrhea: NEGATIVE

## 2014-07-06 NOTE — Telephone Encounter (Signed)
Pt is calling about her test results. jw

## 2014-07-06 NOTE — Telephone Encounter (Signed)
Patient informed of message from MD (see previous phone note), expressed understanding.

## 2014-07-09 ENCOUNTER — Telehealth: Payer: Self-pay | Admitting: Family Medicine

## 2014-07-09 ENCOUNTER — Ambulatory Visit: Payer: No Typology Code available for payment source | Admitting: Family Medicine

## 2014-07-09 NOTE — Telephone Encounter (Signed)
Please see phone note from Dr. Raoul Pitch dated earlier today and call pt back with these results. Leeanne Rio, MD

## 2014-07-09 NOTE — Telephone Encounter (Signed)
Spoke with patient and gave below result information

## 2014-07-09 NOTE — Telephone Encounter (Signed)
Please call [facility], her gonorrhea and chlamydia are negative. Thanks.

## 2014-07-09 NOTE — Telephone Encounter (Signed)
Pt called and would like to have her test results jw

## 2014-08-08 ENCOUNTER — Other Ambulatory Visit: Payer: Self-pay | Admitting: Family Medicine

## 2014-10-26 ENCOUNTER — Telehealth: Payer: Self-pay | Admitting: Family Medicine

## 2014-10-26 NOTE — Telephone Encounter (Signed)
Pt informed that she needed to come in for appt or get prilosec OTC. Taron Mondor Kennon Holter, CMA

## 2014-10-26 NOTE — Telephone Encounter (Signed)
Pt called and would like a refill on her acid reflux medication. She hasn't had any in a while but needs it now. jw

## 2014-11-20 ENCOUNTER — Other Ambulatory Visit: Payer: Self-pay | Admitting: Family Medicine

## 2014-11-20 NOTE — Telephone Encounter (Signed)
Filling HCTZ for 1 month but per July 2015 note, due for f/u with MD. Last creatinine normal 06/2014. Hilton Sinclair, MD

## 2014-11-29 ENCOUNTER — Other Ambulatory Visit: Payer: Self-pay | Admitting: *Deleted

## 2014-11-29 MED ORDER — ALBUTEROL SULFATE HFA 108 (90 BASE) MCG/ACT IN AERS: 2.0000 | INHALATION_SPRAY | RESPIRATORY_TRACT | Status: DC | PRN

## 2014-12-16 ENCOUNTER — Encounter (HOSPITAL_COMMUNITY): Payer: Self-pay | Admitting: *Deleted

## 2014-12-16 ENCOUNTER — Emergency Department (HOSPITAL_COMMUNITY)
Admission: EM | Admit: 2014-12-16 | Discharge: 2014-12-16 | Disposition: A | Discharge status: Home / Self Care | Payer: No Typology Code available for payment source | Source: Home / Self Care | Attending: Emergency Medicine | Admitting: Emergency Medicine

## 2014-12-16 ENCOUNTER — Emergency Department (INDEPENDENT_AMBULATORY_CARE_PROVIDER_SITE_OTHER): Payer: No Typology Code available for payment source

## 2014-12-16 DIAGNOSIS — J45901 Unspecified asthma with (acute) exacerbation: Secondary | ICD-10-CM

## 2014-12-16 DIAGNOSIS — J069 Acute upper respiratory infection, unspecified: Secondary | ICD-10-CM | POA: Diagnosis not present

## 2014-12-16 DIAGNOSIS — I10 Essential (primary) hypertension: Secondary | ICD-10-CM

## 2014-12-16 MED ORDER — IPRATROPIUM-ALBUTEROL 0.5-2.5 (3) MG/3ML IN SOLN
3.0000 mL | Freq: Once | RESPIRATORY_TRACT | Status: AC
  Administered 2014-12-16: 3 mL via RESPIRATORY_TRACT

## 2014-12-16 MED ORDER — ALBUTEROL SULFATE (2.5 MG/3ML) 0.083% IN NEBU
INHALATION_SOLUTION | RESPIRATORY_TRACT | Status: AC
  Filled 2014-12-16: qty 6

## 2014-12-16 MED ORDER — METHYLPREDNISOLONE SODIUM SUCC 125 MG IJ SOLR
INTRAMUSCULAR | Status: AC
  Filled 2014-12-16: qty 2

## 2014-12-16 MED ORDER — METHYLPREDNISOLONE SODIUM SUCC 125 MG IJ SOLR
125.0000 mg | Freq: Once | INTRAMUSCULAR | Status: AC
  Administered 2014-12-16: 125 mg via INTRAMUSCULAR

## 2014-12-16 MED ORDER — IPRATROPIUM-ALBUTEROL 0.5-2.5 (3) MG/3ML IN SOLN
RESPIRATORY_TRACT | Status: AC
  Filled 2014-12-16: qty 3

## 2014-12-16 MED ORDER — METHYLPREDNISOLONE 4 MG PO KIT: PACK | ORAL | Status: DC

## 2014-12-16 MED ORDER — ALBUTEROL SULFATE (5 MG/ML) 0.5% IN NEBU
5.0000 mg | INHALATION_SOLUTION | Freq: Once | RESPIRATORY_TRACT | Status: AC
  Administered 2014-12-16: 5 mg via RESPIRATORY_TRACT

## 2014-12-16 NOTE — Discharge Instructions (Signed)
Take the Medrol Dosepak as prescribed. Use your albuterol inhaler every 4 hours as needed. You should improve over the next few days. Follow-up with your PCP if no improvement in the next 3-5 days. If your symptoms worsen, you develop fevers please go to the emergency room.

## 2014-12-16 NOTE — ED Provider Notes (Signed)
CSN: [id number]     Arrival date & time 12/16/14  1354 History   First MD Initiated Contact with Patient 12/16/14 1521     No chief complaint on file.  (Consider location/radiation/quality/duration/timing/severity/associated sxs/prior Treatment) HPI She is a 49 year old woman with a history of asthma here for evaluation of shortness of breath. She states for the last week she has had some mild nasal congestion and a cough. She reports some intermittent shortness of breath during this time, but the shortness of breath got a lot worse last night and this morning. She had been out of her albuterol inhaler. She did get that yesterday and has used it multiple times with minimal improvement. She reports chest tightness and feeling like she cannot get a good breath. No fevers or chills. She has tried Alka-Seltzer plus D without improvement.  Also, she has not taken her blood pressure medication in 1 week.  Past Medical History  Diagnosis Date  . Asthma   . Hypertension    No past surgical history on file. Family History  Problem Relation Age of Onset  . Heart attack Father    History  Substance Use Topics  . Smoking status: Never Smoker   . Smokeless tobacco: Never Used  . Alcohol Use: Yes     Comment: occasional   OB History    No data available     Review of Systems  Constitutional: Negative for fever and chills.  HENT: Positive for congestion. Negative for rhinorrhea and sore throat.   Respiratory: Positive for cough, chest tightness, shortness of breath and wheezing.   Cardiovascular: Negative for chest pain.    Allergies  Clindamycin/lincomycin; Codeine; Metronidazole; Sulfa antibiotics; and Sulfamethoxazole  Home Medications   Prior to Admission medications   Medication Sig Start Date End Date Taking? Authorizing Provider  albuterol (PROVENTIL HFA) 108 (90 BASE) MCG/ACT inhaler Inhale 2 puffs into the lungs every 4 (four) hours as needed for wheezing. 11/29/14   Leeanne Rio, MD  fluconazole (DIFLUCAN) 150 MG tablet Take 1 tablet (150 mg total) by mouth daily. 07/05/14   Renee A Kuneff, DO  hydrochlorothiazide (HYDRODIURIL) 12.5 MG tablet Take 1 tablet (12.5 mg total) by mouth daily. Due for appointment with MD 11/20/14   Hilton Sinclair, MD  hydrochlorothiazide (MICROZIDE) 12.5 MG capsule Take 12.5 mg by mouth daily.    Historical Provider, MD  MONONESSA 0.25-35 MG-MCG tablet TAKE 1 TABLET BY MOUTH DAILY 08/10/14   Leeanne Rio, MD   BP 182/102 mmHg  Pulse 85  Temp(Src) 98.9 F (37.2 C) (Oral)  Resp 16  SpO2 97%  LMP 12/12/2014 Physical Exam  Constitutional: She is oriented to person, place, and time. She appears well-developed and well-nourished. She appears distressed (appear short of breath).  HENT:  Head: Normocephalic and atraumatic.  Neck: Neck supple.  Cardiovascular: Normal rate, regular rhythm and normal heart sounds.   No murmur heard. Pulmonary/Chest: She is in respiratory distress (She is speaking in short sentences.  Lungs fields are very tight with poor air movement.). She has wheezes. She has no rales. She exhibits no tenderness.  Lymphadenopathy:    She has no cervical adenopathy.  Neurological: She is oriented to person, place, and time.    ED Course  Procedures (including critical care time) Labs Review Labs Reviewed - No data to display  Imaging Review No results found.   MDM  No diagnosis found. DuoNeb given. Solu-Medrol 125 mg IM given.  Lung exam much improved after  DuoNeb. She continues to have some inspiratory wheezes in the low lower lung fields as well as scattered expiratory wheezes. Air movement is much better. We'll give an additional albuterol nebulizer.  She continues to have an occasional expiratory wheeze, but lung fields are much improved. We'll discharge with a Medrol Dosepak. Instructed to use her inhaler every 4 hours as needed. Emphasized importance of taking her blood pressure  medication every day.  Follow-up with PCP or here as needed.  [name], MD 12/16/14 [phone]

## 2014-12-16 NOTE — ED Notes (Addendum)
C/o breathing difficulty onset 4 days ago.  Could not afford her inhaler but got it last night (Proair) but states it did not help.  She used it 3 times last night and 3 x today.  C/o cough prod. of yellowish green sputum, for 1 week.  No runny nose, earache of fever.  Took Alka Seltzer cold plus last night and today.  She thinks that is was made her BP go up, but has not taken her BP medicine for 1 week.

## 2015-03-29 ENCOUNTER — Encounter: Payer: Self-pay | Admitting: Family Medicine

## 2015-03-29 ENCOUNTER — Ambulatory Visit (INDEPENDENT_AMBULATORY_CARE_PROVIDER_SITE_OTHER): Payer: No Typology Code available for payment source | Admitting: Family Medicine

## 2015-03-29 ENCOUNTER — Other Ambulatory Visit (HOSPITAL_COMMUNITY)
Admission: RE | Admit: 2015-03-29 | Discharge: 2015-03-29 | Disposition: A | Discharge status: Home / Self Care | Payer: No Typology Code available for payment source | Source: Ambulatory Visit | Attending: Family Medicine | Admitting: Family Medicine

## 2015-03-29 ENCOUNTER — Telehealth: Payer: Self-pay | Admitting: Family Medicine

## 2015-03-29 VITALS — BP 167/71 | HR 62 | Temp 98.3°F | Ht 61.0 in | Wt 135.8 lb

## 2015-03-29 DIAGNOSIS — N3000 Acute cystitis without hematuria: Secondary | ICD-10-CM | POA: Diagnosis not present

## 2015-03-29 DIAGNOSIS — Z113 Encounter for screening for infections with a predominantly sexual mode of transmission: Secondary | ICD-10-CM | POA: Diagnosis not present

## 2015-03-29 DIAGNOSIS — R300 Dysuria: Secondary | ICD-10-CM | POA: Diagnosis not present

## 2015-03-29 DIAGNOSIS — N760 Acute vaginitis: Secondary | ICD-10-CM | POA: Diagnosis not present

## 2015-03-29 DIAGNOSIS — N898 Other specified noninflammatory disorders of vagina: Secondary | ICD-10-CM

## 2015-03-29 LAB — POCT URINALYSIS DIPSTICK
BILIRUBIN UA: NEGATIVE
Blood, UA: NEGATIVE
Glucose, UA: NEGATIVE
Ketones, UA: NEGATIVE
Nitrite, UA: NEGATIVE
SPEC GRAV UA: 1.025
UROBILINOGEN UA: 2
pH, UA: 7

## 2015-03-29 LAB — POCT UA - MICROSCOPIC ONLY

## 2015-03-29 MED ORDER — CEPHALEXIN 500 MG PO CAPS: 500.0000 mg | ORAL_CAPSULE | Freq: Three times a day (TID) | ORAL | Status: DC

## 2015-03-29 MED ORDER — METRONIDAZOLE 0.75 % VA GEL: 1.0000 | Freq: Two times a day (BID) | VAGINAL | Status: DC

## 2015-03-29 MED ORDER — FLUCONAZOLE 150 MG PO TABS: 150.0000 mg | ORAL_TABLET | Freq: Every day | ORAL | Status: DC

## 2015-03-29 MED ORDER — METRONIDAZOLE 500 MG PO TABS: 500.0000 mg | ORAL_TABLET | Freq: Two times a day (BID) | ORAL | Status: DC

## 2015-03-29 NOTE — Telephone Encounter (Signed)
Pt called back. Discussed in person the same as in documented message -- pt decided to retry metronidazole PO at a lower dose in place of MetroGel. Rx for metronidazole 500 mg PO BID (reduced from TID) for 7 day. F/u otherwise as needed. Pt voiced understanding. --CMS

## 2015-03-29 NOTE — Telephone Encounter (Signed)
This patient was seen by Dr. Venetia Maxon this morning and the Rx given to her is costing her $120. She would like to be prescribed something cheaper if possible. Please advise. Thank you, Fonda Kinder, ASA

## 2015-03-29 NOTE — Patient Instructions (Signed)
Thank you for coming in, today!  I think you have a yeast infection and possibly BV, as well. I want you to take medication for both. Take Diflucan once today for yeast, then again in 1 week if you need. Use metronidazole gel twice a day for 7 days for BV.  Your urine does look like it might be infected. I will give you an antibiotic for 5 days. I will culture your urine to see what (if anything) grows. I will call you to let you know to either finish your antibiotic, stop early, or change to a different one if needed.  Come back to see Dr. Ardelia Mems as you need. I will call you with your other results, as well.  Please feel free to call with any questions or concerns at any time, at [phone]. --Dr. Venetia Maxon

## 2015-03-29 NOTE — Progress Notes (Signed)
   Subjective:    Patient ID: Sara Day, female    DOB: [birth year], 49 y.o.   MRN: [id number]  HPI: Pt presents to clinic with complaint of abdominal pain and cramping. She reports mild cramping in middle-low abdomen for about 1 week. She has a very small amount of vaginal discharge (white, thick). She has had some burning and itching and yeast cream over the counter helped some, but it is still there. She does not have any frank dysuria but does report "irritation inside" when she urinates. She denies fever / chills, N/V, other abdominal pain, or change in bowel habits. She has used some douching products a couple of days ago, which has not helped her symptoms but did help with some odor. She is sexually active with one partner; last intercourse was about 1 month ago.  Of note, pt does report some occasional heartburn-type symptoms, for which she has taken omeprazole in the past, which has helped.  Review of Systems: As above.     Objective:   Physical Exam BP 167/71 mmHg  Pulse 62  Temp(Src) 98.3 F (36.8 C) (Oral)  Ht 5\' 1"  (1.549 m)  Wt 135 lb 12.8 oz (61.598 kg)  BMI 25.67 kg/m2 Gen: well-appearing adult female in NAD HEENT: [place]/AT, EOMI, PERRLA, MMM Neck: supple, normal ROM, no lymphadenopathy Cardio: RRR, no murmur appreciated Pulm: CTAB, no wheezes, normal WOB Abd: soft, mildly tender over suprapubic area, BS+ Ext: warm, well-perfused, no LE edema GU: normal external vaginal / vulvar structures Speculum exam: mild-moderate vaginal mucosal irritation and small amount of whitish discharge consistent with yeast vaginitis  Small amount of thinner discharge with moderate odor consistent with concomitant BV infection  No frank cervical bleeding or friability Bimanual exam: no CMT tenderness or frank adnexal masses noted; no definite fundal tenderness but exam limited by suprapubic tenderness of abdomen  UA: trace protein, trace leukocytes Urine micro: 1+ bacteria, 3+ mucous,  10-15 epithelial cells, 1-3 WBC / hpf Urine culture: collected today, result pending     Assessment & Plan:  49yo female with presumptive yeast vaginitis and likely BV as well as likely mild cystitis - deferred wet prep given consistency of exam with yeast and BV and hx of recurrent infections with both - Rx for Keflex empirically, urine culture pending (will f/u and adjust abx if / as appropriate) - Rx for Diflucan for yeast and MetroGel for BV (see phone note; MetroGel changed to reduced-dose Flagyl PO due to cost despite prior intolerance to PO form) - GC / Chlamydia probe also collected, today; deferred blood STI tests per pt preference - advised representation to clinic or to the ED for any frank worsening or progression of symptoms / development of systemic symptoms - f/u with PCP Dr. Ardelia Mems PRN, otherwise  Note FYI to Dr. Ardelia Mems.  Emmaline Kluver, MD PGY-3, [facility] 03/29/2015, 9:59 PM

## 2015-03-29 NOTE — Telephone Encounter (Signed)
Called pt to discuss; left message on on voice-identified voicemail. Explained that Keflex and Diflucan should not be too expensive and I am more concerned for yeast and possible UTI than BV, so she may try those two medications alone if she so chooses, then can f/u as needed if there is no improvement. She is allergic / intolerant to metronidazole PO and clindamycin, so these are not good alternatives. There are not other good choices for BV. Advised her that if she would like, I am happy to call in metronidazole PO at a reduced dose, which should be much cheaper. Advised her to call back if she would like any of these alternatives. --CMS

## 2015-03-30 LAB — URINE CULTURE: Colony Count: 50000

## 2015-04-01 LAB — CERVICOVAGINAL ANCILLARY ONLY
Chlamydia: NEGATIVE
Neisseria Gonorrhea: NEGATIVE

## 2015-04-02 ENCOUNTER — Telehealth: Payer: Self-pay | Admitting: Family Medicine

## 2015-04-02 NOTE — Telephone Encounter (Addendum)
Called pt to relay negative test results (GC/Chlamydia) and to recommend completion of prescribed medications -- see recent office and phone notes. Left message on voice-identified voice mail to that effect. Instructed pt to f/u with [place] as needed and to call with any questions / concerns, otherwise. --CMS  Addendum: urine culture inconclusive (multiple morphologies, 50k CFU / mL), would advise completion of abx and f/u PRN.

## 2015-04-16 ENCOUNTER — Telehealth: Payer: Self-pay | Admitting: Family Medicine

## 2015-04-16 ENCOUNTER — Other Ambulatory Visit: Payer: Self-pay | Admitting: Family Medicine

## 2015-04-16 NOTE — Telephone Encounter (Signed)
Patient informed, will call back to schedule appointment. 

## 2015-04-16 NOTE — Telephone Encounter (Signed)
To [facility] red team - please call pt and let her know I have refilled her HCTZ but she needs to schedule an appointment to follow up on her blood pressure. Thanks!  Leeanne Rio, MD

## 2015-06-13 ENCOUNTER — Other Ambulatory Visit: Payer: Self-pay | Admitting: Internal Medicine

## 2015-06-13 NOTE — Telephone Encounter (Signed)
Please send refill for hctz to [facility] on [address]

## 2015-06-14 MED ORDER — HYDROCHLOROTHIAZIDE 12.5 MG PO TABS: 12.5000 mg | ORAL_TABLET | Freq: Every day | ORAL | Status: DC

## 2015-07-29 ENCOUNTER — Other Ambulatory Visit: Payer: Self-pay | Admitting: *Deleted

## 2015-07-29 MED ORDER — NORGESTIMATE-ETH ESTRADIOL 0.25-35 MG-MCG PO TABS: 1.0000 | ORAL_TABLET | Freq: Every day | ORAL | Status: DC

## 2015-07-29 NOTE — Telephone Encounter (Signed)
Will refill for a few months but would like to see patient in clinic to discuss her high blood pressures.

## 2015-07-30 ENCOUNTER — Other Ambulatory Visit: Payer: Self-pay | Admitting: Internal Medicine

## 2015-07-30 MED ORDER — NORGESTIMATE-ETH ESTRADIOL 0.25-35 MG-MCG PO TABS: 1.0000 | ORAL_TABLET | Freq: Every day | ORAL | Status: DC

## 2015-09-06 ENCOUNTER — Other Ambulatory Visit (HOSPITAL_COMMUNITY)
Admission: RE | Admit: 2015-09-06 | Discharge: 2015-09-06 | Disposition: A | Discharge status: Home / Self Care | Payer: 59 | Source: Ambulatory Visit | Attending: Family Medicine | Admitting: Family Medicine

## 2015-09-06 ENCOUNTER — Other Ambulatory Visit: Payer: Self-pay | Admitting: *Deleted

## 2015-09-06 ENCOUNTER — Ambulatory Visit (INDEPENDENT_AMBULATORY_CARE_PROVIDER_SITE_OTHER): Payer: 59 | Admitting: Family Medicine

## 2015-09-06 ENCOUNTER — Telehealth: Payer: Self-pay | Admitting: Family Medicine

## 2015-09-06 ENCOUNTER — Encounter: Payer: Self-pay | Admitting: Family Medicine

## 2015-09-06 VITALS — BP 164/82 | HR 64 | Temp 98.5°F | Ht 61.0 in | Wt 135.9 lb

## 2015-09-06 DIAGNOSIS — N898 Other specified noninflammatory disorders of vagina: Secondary | ICD-10-CM | POA: Diagnosis not present

## 2015-09-06 DIAGNOSIS — R109 Unspecified abdominal pain: Secondary | ICD-10-CM | POA: Diagnosis not present

## 2015-09-06 DIAGNOSIS — Z113 Encounter for screening for infections with a predominantly sexual mode of transmission: Secondary | ICD-10-CM | POA: Insufficient documentation

## 2015-09-06 LAB — POCT WET PREP (WET MOUNT): Clue Cells Wet Prep Whiff POC: POSITIVE

## 2015-09-06 MED ORDER — CLINDAMYCIN PHOSPHATE 100 MG VA SUPP: 100.0000 mg | Freq: Every day | VAGINAL | Status: DC

## 2015-09-06 MED ORDER — ALBUTEROL SULFATE HFA 108 (90 BASE) MCG/ACT IN AERS: 2.0000 | INHALATION_SPRAY | RESPIRATORY_TRACT | Status: DC | PRN

## 2015-09-06 NOTE — Progress Notes (Signed)
   Subjective:    Patient ID: Sara Day, female    DOB: [birth year], 49 y.o.   MRN: [id number]  HPI 49 year old female presents for evaluation of abdominal cramps and vaginal discharge.  Abdominal cramping/vaginal discharge - present for the past 2 weeks, patient has noted some yellow odorous discharge, symptoms are similar to previous episode of bacterial vaginosis, no fevers, chills, normal bowel movements, no emesis, no nausea, currently on OCP, LMP was one week ago, last intercourse was one month ago with female partner, no dysuria, has attempted over-the-counter ibuprofen which has provided minimal relief of her symptoms   Review of Systems See above    Objective:   Physical Exam Vitals: Reviewed Gen.: Pleasant female, no acute distress GU: Chaperone present, no external vulvar lesions, scant amount of cervical discharge noted on speculum exam, wet prep and GC chlamydia obtained, mild uterine tenderness noted on bimanual exam Abdomen: Soft, bilateral lower quadrant tenderness   HIV and RPR performed one year ago were negative    Assessment & Plan:  Vaginal discharge Patient presents for evaluation of vaginal discharge and abdominal cramping. -Check wet prep and GC -Patient will be notified of results

## 2015-09-06 NOTE — Telephone Encounter (Signed)
Patient notified of BV. Started Clindamycin suppository.

## 2015-09-06 NOTE — Assessment & Plan Note (Signed)
Patient presents for evaluation of vaginal discharge and abdominal cramping. -Check wet prep and GC -Patient will be notified of results

## 2015-09-06 NOTE — Assessment & Plan Note (Signed)
Wet prep positive for BV. Will start clindamycin gel (allergy to Flagyl).

## 2015-09-06 NOTE — Patient Instructions (Signed)
It was nice to meet you today.  Dr. Ree Kida will call you with your results.

## 2015-09-09 ENCOUNTER — Telehealth: Payer: Self-pay | Admitting: *Deleted

## 2015-09-09 ENCOUNTER — Other Ambulatory Visit: Payer: Self-pay | Admitting: Internal Medicine

## 2015-09-09 LAB — CERVICOVAGINAL ANCILLARY ONLY
CHLAMYDIA, DNA PROBE: NEGATIVE
NEISSERIA GONORRHEA: NEGATIVE

## 2015-09-09 MED ORDER — METRONIDAZOLE 500 MG PO TABS: 500.0000 mg | ORAL_TABLET | Freq: Two times a day (BID) | ORAL | Status: DC

## 2015-09-09 NOTE — Telephone Encounter (Signed)
Prescribed metronidazole 500 mg BID x 7 days. Patient has taken before despite mild rash. Left message with patient to call clinic if this is not acceptable. We have limited options between cost and allergies.

## 2015-09-09 NOTE — Telephone Encounter (Signed)
Prior Authorization received from [place] for Cleocin 100 mg suppository. PA completed online at [email or web address] and approved.  PA approval number: [id number].  [facility] pharmacy was called and informed of the approval, however it is going to cost the patient $160 co-pay.  Please advise.   Derl Barrow, RN

## 2015-09-10 ENCOUNTER — Telehealth: Payer: Self-pay | Admitting: Internal Medicine

## 2015-09-10 MED ORDER — METRONIDAZOLE 0.75 % VA GEL: 1.0000 | Freq: Two times a day (BID) | VAGINAL | Status: DC

## 2015-09-10 NOTE — Telephone Encounter (Signed)
Pt says she is allergic to the flagyl pills and would like the cream

## 2015-09-10 NOTE — Telephone Encounter (Signed)
Patient informed. 

## 2015-09-10 NOTE — Telephone Encounter (Signed)
I am covering for Dr. Ree Kida who is away from the office.  rx sent in for metrogel. It appears patient has tolerated this in the past. Please inform patient.  Leeanne Rio, MD

## 2015-09-10 NOTE — Telephone Encounter (Signed)
Will forward to last MD seen 

## 2015-09-16 ENCOUNTER — Encounter: Payer: Self-pay | Admitting: Family Medicine

## 2015-10-14 ENCOUNTER — Other Ambulatory Visit: Payer: Self-pay | Admitting: Family Medicine

## 2015-10-14 DIAGNOSIS — I10 Essential (primary) hypertension: Secondary | ICD-10-CM

## 2015-10-17 NOTE — Telephone Encounter (Signed)
2nd request.  Bary Limbach L, RN  

## 2015-11-10 ENCOUNTER — Other Ambulatory Visit: Payer: Self-pay | Admitting: Internal Medicine

## 2015-11-13 ENCOUNTER — Other Ambulatory Visit: Payer: Self-pay | Admitting: Internal Medicine

## 2015-11-13 MED ORDER — NORGESTIMATE-ETH ESTRADIOL 0.25-35 MG-MCG PO TABS: 1.0000 | ORAL_TABLET | Freq: Every day | ORAL | Status: DC

## 2015-11-13 NOTE — Telephone Encounter (Signed)
Pt calling to check on status of this request. Sara Day, ASA ° °

## 2015-11-18 ENCOUNTER — Telehealth: Payer: Self-pay | Admitting: Internal Medicine

## 2015-11-18 NOTE — Telephone Encounter (Signed)
Patient recently requested refill of her OCP. Called to inform patient that given her high blood pressure there are safer birth control methods I would like to discuss with her. She said she would call to make an appointment.

## 2015-11-19 ENCOUNTER — Ambulatory Visit: Payer: Self-pay | Admitting: Internal Medicine

## 2015-11-21 ENCOUNTER — Ambulatory Visit (INDEPENDENT_AMBULATORY_CARE_PROVIDER_SITE_OTHER): Payer: BLUE CROSS/BLUE SHIELD | Admitting: Family Medicine

## 2015-11-21 VITALS — BP 146/82 | HR 59 | Wt 132.7 lb

## 2015-11-21 DIAGNOSIS — I10 Essential (primary) hypertension: Secondary | ICD-10-CM | POA: Diagnosis not present

## 2015-11-21 DIAGNOSIS — Z3009 Encounter for other general counseling and advice on contraception: Secondary | ICD-10-CM | POA: Diagnosis not present

## 2015-11-21 LAB — POCT URINE PREGNANCY: Preg Test, Ur: NEGATIVE

## 2015-11-21 NOTE — Assessment & Plan Note (Signed)
Repeat BP 146/82. Would recommend adding second agent but defer this to PCP

## 2015-11-21 NOTE — Progress Notes (Signed)
   Subjective:    Patient ID: Sara Day, female    DOB: [birth year], 50 y.o.   MRN: [id number]  HPI Contraception: patient here to discuss contraception options. She was on OCP in the past. She was told this could increase her risk of having stroke in the setting of her high blood pressure. She has no other risk factors. I discussed the options available to her starting with most effective to least effective. She didn't want IUD. She asked about Depo and Nexplanon but couldn't make decision. She was also repeatedly looking at her phone hardly focusing on our discussion. 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 I explained to her that the procedure would take only 5-10 minutes. However, chose to reschedule and come back another time.  Patient is not on any kind of birth control now. I encouraged her to abstain or use condom consistently until she comes back. Review of Systems Per HPI    Objective:   Physical Exam  Filed Vitals:   11/21/15 1145 11/21/15 1205  BP: 172/90 146/82  Pulse:  59  Weight: 132 lb 11.2 oz (60.192 kg)    Gen: appears well, no acute distress    Assessment & Plan:  Contraception management Patient undecided on Nexplanon and Depo today. She refused IUD but didn't have specific explanation why. She was in hurry hardly focusing. She says she would reschedule and return. Gave her handout on contraception options.  She is not on any contraception at this time. Advised her to abstain or consistently use condoms although the later is not 100%.  ESSENTIAL HYPERTENSION, BENIGN Repeat BP 146/82. Would recommend adding second agent but defer this to PCP

## 2015-11-21 NOTE — Patient Instructions (Signed)
It was great seeing you today! We have addressed the following issues today  1. Contraception: oral contraception could increase your risk of having stroke or heart issue in the setting of high blood pressure. We would recommend using other safer methods whenever your are ready.   If we did any lab work today, and the results require attention, either me or my nurse will get in touch with you. If everything is normal, you will get a letter in mail. If you don't hear from [place] in two weeks, please give [place] a call. Otherwise, I look forward to talking with you again at our next visit. If you have any questions or concerns before then, please call the clinic at [phone].  Please bring all your medications to every doctors visit   Sign up for My Chart to have easy access to your labs results, and communication with your Primary care physician.    Please check-out at the front desk before leaving the clinic.   Take Care,

## 2015-11-21 NOTE — Assessment & Plan Note (Signed)
Patient undecided on Nexplanon and Depo today. She refused IUD but didn't have specific explanation why. She was in hurry hardly focusing. She says she would reschedule and return. Gave her handout on contraception options.  She is not on any contraception at this time. Advised her to abstain or consistently use condoms although the later is not 100%.

## 2015-12-09 ENCOUNTER — Other Ambulatory Visit: Payer: Self-pay | Admitting: *Deleted

## 2015-12-09 DIAGNOSIS — I10 Essential (primary) hypertension: Secondary | ICD-10-CM

## 2015-12-09 MED ORDER — HYDROCHLOROTHIAZIDE 12.5 MG PO TABS: 12.5000 mg | ORAL_TABLET | Freq: Every day | ORAL | Status: DC

## 2015-12-19 ENCOUNTER — Telehealth: Payer: Self-pay | Admitting: Internal Medicine

## 2015-12-19 NOTE — Telephone Encounter (Signed)
Would like to have a muscle relaxer for pain to back area.  Can send to [facility] on [address]

## 2015-12-23 NOTE — Telephone Encounter (Signed)
Pt is calling and would like to have a muscle relaxer called in. She is not on a muscle relaxer at this time but feels she needs one for her lower back. jw

## 2015-12-24 NOTE — Telephone Encounter (Signed)
Called patient, line just rings and rings with no option for voicemail. Patient will need to come in and be evaluated before muscle relaxer can be prescribed.

## 2015-12-31 NOTE — Telephone Encounter (Signed)
Message left for patient to schedule an appointment

## 2016-04-03 ENCOUNTER — Ambulatory Visit (HOSPITAL_COMMUNITY)
Admission: EM | Admit: 2016-04-03 | Discharge: 2016-04-03 | Disposition: A | Discharge status: Home / Self Care | Payer: BLUE CROSS/BLUE SHIELD | Attending: Family Medicine | Admitting: Family Medicine

## 2016-04-03 ENCOUNTER — Encounter (HOSPITAL_COMMUNITY): Payer: Self-pay

## 2016-04-03 DIAGNOSIS — H109 Unspecified conjunctivitis: Secondary | ICD-10-CM | POA: Diagnosis not present

## 2016-04-03 MED ORDER — TOBRAMYCIN 0.3 % OP SOLN
1.0000 [drp] | Freq: Once | OPHTHALMIC | Status: AC
  Administered 2016-04-03: 1 [drp] via OPHTHALMIC

## 2016-04-03 MED ORDER — TOBRAMYCIN 0.3 % OP SOLN
OPHTHALMIC | Status: AC
  Filled 2016-04-03: qty 5

## 2016-04-03 NOTE — Discharge Instructions (Signed)
Use warm cloth to eye before medicine for 1 week. See your doctor as needed.

## 2016-04-03 NOTE — ED Notes (Signed)
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

## 2016-04-03 NOTE — ED Provider Notes (Signed)
CSN: [id number]     Arrival date & time 04/03/16  1832 History   First MD Initiated Contact with Patient 04/03/16 1900     Chief Complaint  Patient presents with  . Conjunctivitis   (Consider location/radiation/quality/duration/timing/severity/associated sxs/prior Treatment) Patient is a 50 y.o. female presenting with conjunctivitis. The history is provided by the patient.  Conjunctivitis This is a new problem. 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 The problem has not changed since onset.Associated symptoms comments: Tearing left eye only, no fb sx..    Past Medical History  Diagnosis Date  . Asthma   . Hypertension    History reviewed. No pertinent past surgical history. Family History  Problem Relation Age of Onset  . Heart attack Father    Social History  Substance Use Topics  . Smoking status: Never Smoker   . Smokeless tobacco: Never Used  . Alcohol Use: Yes     Comment: occasional   OB History    No data available     Review of Systems  Constitutional: Negative.   HENT: Negative.   Eyes: Positive for discharge, redness and itching. Negative for photophobia, pain and visual disturbance.  Respiratory: Negative.   All other systems reviewed and are negative.   Allergies  Clindamycin/lincomycin; Codeine; Metronidazole; Sulfa antibiotics; and Sulfamethoxazole  Home Medications   Prior to Admission medications   Medication Sig Start Date End Date Taking? Authorizing Provider  hydrochlorothiazide (HYDRODIURIL) 12.5 MG tablet Take 1 tablet (12.5 mg total) by mouth daily. Please make appointment for more refills. 06/14/15  Yes Hillary Corinda Gubler, MD  hydrochlorothiazide (HYDRODIURIL) 12.5 MG tablet Take 1 tablet (12.5 mg total) by mouth daily. 12/09/15  Yes Hillary Corinda Gubler, MD  albuterol (PROVENTIL HFA) 108 (90 BASE) MCG/ACT inhaler Inhale 2 puffs into the lungs every 4 (four) hours as needed for  wheezing. 09/06/15   Lupita Dawn, MD  clindamycin (CLEOCIN) 100 MG vaginal suppository Place 1 suppository (100 mg total) vaginally at bedtime. 09/06/15   Hillary Corinda Gubler, MD  metroNIDAZOLE (FLAGYL) 500 MG tablet Take 1 tablet (500 mg total) by mouth 2 (two) times daily. 09/09/15   Hillary Corinda Gubler, MD  metroNIDAZOLE (METROGEL VAGINAL) 0.75 % vaginal gel Place 1 Applicatorful vaginally 2 (two) times daily. For 5 days 09/10/15   Leeanne Rio, MD  norgestimate-ethinyl estradiol (ORTHO-CYCLEN,SPRINTEC,PREVIFEM) 0.25-35 MG-MCG tablet Take 1 tablet by mouth daily. 11/13/15   Asiyah Cletis Media, MD   Meds Ordered and Administered this Visit   Medications  tobramycin (TOBREX) 0.3 % ophthalmic solution 1 drop (1 drop Left Eye Given 04/03/16 1912)    BP 147/96 mmHg  Pulse 63  Temp(Src) 98.4 F (36.9 C) (Oral)  Resp 18  SpO2 96%  LMP 03/13/2016 (Exact Date) No data found.   Physical Exam  Constitutional: She is oriented to person, place, and time. She appears well-developed and well-nourished. No distress.  Eyes: EOM are normal. Pupils are equal, round, and reactive to light. Left eye exhibits discharge. Left conjunctiva is injected.  Neck: Normal range of motion. Neck supple.  Lymphadenopathy:    She has no cervical adenopathy.  Neurological: She is alert and oriented to person, place, and time.  Skin: Skin is warm and dry.  Nursing note and vitals reviewed.   ED Course  Procedures (including critical care time)  Labs Review Labs Reviewed - No data to display  Imaging Review No results found.   Visual Acuity  Review  Right Eye Distance:   Left Eye Distance:   Bilateral Distance:    Right Eye Near:   Left Eye Near:    Bilateral Near:         MDM   1. Conjunctivitis of left eye        Billy Fischer, MD 04/03/16 [phone]

## 2016-04-04 ENCOUNTER — Encounter (HOSPITAL_COMMUNITY): Payer: Self-pay | Admitting: *Deleted

## 2016-04-04 ENCOUNTER — Ambulatory Visit (HOSPITAL_COMMUNITY)
Admission: EM | Admit: 2016-04-04 | Discharge: 2016-04-04 | Disposition: A | Discharge status: Home / Self Care | Payer: BLUE CROSS/BLUE SHIELD | Attending: Family Medicine | Admitting: Family Medicine

## 2016-04-04 DIAGNOSIS — H109 Unspecified conjunctivitis: Secondary | ICD-10-CM

## 2016-04-04 MED ORDER — TOBRAMYCIN-DEXAMETHASONE 0.3-0.1 % OP SUSP: 1.0000 [drp] | Freq: Four times a day (QID) | OPHTHALMIC | Status: DC

## 2016-04-04 NOTE — Discharge Instructions (Signed)
Return as needed

## 2016-04-04 NOTE — ED Notes (Signed)
Pt    Reports   She  [facility]   [facility]   [facility]  [facility]        For   Conjunctivitis  Of  The  l  Eye    She  Reports        The       Eye  Is  Puffy    And    Itchy        Pt thinks  She  May  Be  Having a  Reaction to  The  med

## 2016-04-04 NOTE — ED Provider Notes (Signed)
CSN: [id number]     Arrival date & time 04/04/16  1754 History   None    Chief Complaint  Patient presents with  . Conjunctivitis   (Consider location/radiation/quality/duration/timing/severity/associated sxs/prior Treatment) Patient is a 50 y.o. female presenting with conjunctivitis. The history is provided by the patient.  Conjunctivitis This is a recurrent problem. The current episode started yesterday. The problem has not changed (seen yest and given tobrex but eye still itchy.) since onset.   Past Medical History  Diagnosis Date  . Asthma   . Hypertension    History reviewed. No pertinent past surgical history. Family History  Problem Relation Age of Onset  . Heart attack Father    Social History  Substance Use Topics  . Smoking status: Never Smoker   . Smokeless tobacco: Never Used  . Alcohol Use: Yes     Comment: occasional   OB History    No data available     Review of Systems  Constitutional: Negative.   HENT: Negative.   Eyes: Positive for redness and itching. Negative for photophobia, discharge and visual disturbance.  All other systems reviewed and are negative.   Allergies  Clindamycin/lincomycin; Codeine; Metronidazole; Sulfa antibiotics; and Sulfamethoxazole  Home Medications   Prior to Admission medications   Medication Sig Start Date End Date Taking? Authorizing Provider  albuterol (PROVENTIL HFA) 108 (90 BASE) MCG/ACT inhaler Inhale 2 puffs into the lungs every 4 (four) hours as needed for wheezing. 09/06/15   Lupita Dawn, MD  clindamycin (CLEOCIN) 100 MG vaginal suppository Place 1 suppository (100 mg total) vaginally at bedtime. 09/06/15   Hillary Corinda Gubler, MD  hydrochlorothiazide (HYDRODIURIL) 12.5 MG tablet Take 1 tablet (12.5 mg total) by mouth daily. Please make appointment for more refills. 06/14/15   Hillary Corinda Gubler, MD  hydrochlorothiazide (HYDRODIURIL) 12.5 MG tablet Take 1 tablet (12.5 mg total) by mouth daily. 12/09/15    Hillary Corinda Gubler, MD  metroNIDAZOLE (FLAGYL) 500 MG tablet Take 1 tablet (500 mg total) by mouth 2 (two) times daily. 09/09/15   Hillary Corinda Gubler, MD  metroNIDAZOLE (METROGEL VAGINAL) 0.75 % vaginal gel Place 1 Applicatorful vaginally 2 (two) times daily. For 5 days 09/10/15   Leeanne Rio, MD  norgestimate-ethinyl estradiol (ORTHO-CYCLEN,SPRINTEC,PREVIFEM) 0.25-35 MG-MCG tablet Take 1 tablet by mouth daily. 11/13/15   Asiyah Cletis Media, MD  tobramycin-dexamethasone [facility]) ophthalmic solution Place 1 drop into the left eye every 6 (six) hours. 04/04/16   Billy Fischer, MD   Meds Ordered and Administered this Visit  Medications - No data to display  BP 146/92 mmHg  Pulse 60  Temp(Src) 98.9 F (37.2 C) (Oral)  SpO2 100%  LMP 03/13/2016 (Exact Date) No data found.   Physical Exam  Constitutional: She appears well-developed and well-nourished.  HENT:  Right Ear: External ear normal.  Left Ear: External ear normal.  Mouth/Throat: Oropharynx is clear and moist.  Eyes: EOM are normal. Pupils are equal, round, and reactive to light. Left eye exhibits no discharge. Left conjunctiva is injected.    Neck: Normal range of motion. Neck supple.  Skin: Skin is warm and dry.  Nursing note and vitals reviewed.   ED Course  Procedures (including critical care time)  Labs Review Labs Reviewed - No data to display  Imaging Review No results found.   Visual Acuity Review  Right Eye Distance:   Left Eye Distance:   Bilateral Distance:    Right Eye Near:   Left Eye Near:  Bilateral Near:         MDM   1. Conjunctivitis of left eye       Billy Fischer, MD 04/30/16 1219

## 2016-05-26 ENCOUNTER — Ambulatory Visit (HOSPITAL_COMMUNITY)
Admission: RE | Admit: 2016-05-26 | Discharge: 2016-05-26 | Disposition: A | Discharge status: Home / Self Care | Payer: BLUE CROSS/BLUE SHIELD | Source: Ambulatory Visit | Attending: Family Medicine | Admitting: Family Medicine

## 2016-05-26 ENCOUNTER — Ambulatory Visit (INDEPENDENT_AMBULATORY_CARE_PROVIDER_SITE_OTHER): Payer: BLUE CROSS/BLUE SHIELD | Admitting: Internal Medicine

## 2016-05-26 ENCOUNTER — Encounter: Payer: Self-pay | Admitting: Internal Medicine

## 2016-05-26 VITALS — BP 167/78 | HR 58 | Temp 98.1°F | Ht 61.0 in | Wt 140.0 lb

## 2016-05-26 DIAGNOSIS — R002 Palpitations: Secondary | ICD-10-CM | POA: Diagnosis not present

## 2016-05-26 DIAGNOSIS — I10 Essential (primary) hypertension: Secondary | ICD-10-CM

## 2016-05-26 DIAGNOSIS — R000 Tachycardia, unspecified: Secondary | ICD-10-CM

## 2016-05-26 DIAGNOSIS — R001 Bradycardia, unspecified: Secondary | ICD-10-CM | POA: Insufficient documentation

## 2016-05-26 LAB — BASIC METABOLIC PANEL WITH GFR
BUN: 11 mg/dL (ref 7–25)
CALCIUM: 9.4 mg/dL (ref 8.6–10.4)
CO2: 26 mmol/L (ref 20–31)
CREATININE: 0.84 mg/dL (ref 0.50–1.05)
Chloride: 106 mmol/L (ref 98–110)
GFR, EST NON AFRICAN AMERICAN: 81 mL/min (ref >=60)
GLUCOSE: 90 mg/dL (ref 65–99)
Potassium: 4.1 mmol/L (ref 3.5–5.3)
Sodium: 135 mmol/L (ref 135–146)

## 2016-05-26 LAB — CBC
HEMATOCRIT: 39.2 % (ref 35.0–45.0)
HEMOGLOBIN: 12.7 g/dL (ref 11.7–15.5)
MCH: 28.7 pg (ref 27.0–33.0)
MCHC: 32.4 g/dL (ref 32.0–36.0)
MCV: 88.5 fL (ref 80.0–100.0)
MPV: 10.7 fL (ref 7.5–12.5)
Platelets: 340 10*3/uL (ref 140–400)
RBC: 4.43 MIL/uL (ref 3.80–5.10)
RDW: 13.8 % (ref 11.0–15.0)
WBC: 5.8 10*3/uL (ref 3.8–10.8)

## 2016-05-26 LAB — TSH: TSH: 1.7 mIU/L

## 2016-05-26 MED ORDER — METOPROLOL TARTRATE 25 MG PO TABS: 25.0000 mg | ORAL_TABLET | Freq: Two times a day (BID) | ORAL | 2 refills | Status: DC

## 2016-05-26 NOTE — Assessment & Plan Note (Signed)
Per patient's description, sounds most like tachycardia. EKG obtained which showed only sinus bradycardia, so QT prolongation or WPW unlikely cause of symptoms. No abnormalities on physical exam. Symptoms could be due to PVCs not captured on EKG, stress/anxiety (though patient denying), or unknown etiology.  - Conservative treatment for now with further work-up indicated if worsening or accompanied by more concerning symptoms - Begin metoprolol in addition to HCTZ. May improve chest pain and decrease HR during episodes, as well as improving HTN.  - Encouraged patient to measure BP and HR during episodes with her home blood pressure cuff. - Return precautions given - F/u in one month

## 2016-05-26 NOTE — Assessment & Plan Note (Signed)
Poorly controlled. 167/78 today. Reporting headaches and vision changes that may be 2/2 persistent HTN.  - Continue HCTZ 12.5mg  qd - Begin metoprolol 25mg  BID - BMP given HCTZ use - F/u in one month

## 2016-05-26 NOTE — Patient Instructions (Signed)
It was nice meeting you today Sara Day!  For your blood pressure, continue taking HCTZ (hydrochlorathiazide), and begin taking metoprolol (Lopressor) as well. You will take one tablet (25 mg) in the morning and one at night. This will help lower your blood pressure, your heart rate, and improve chest pain.   When you have palpitations, please use your blood pressure cuff to measure your blood pressure during the palpitations. If your cuff is able to, also measure your heart rate at the time.   I will call you with the results of your lab tests if there are any abnormalities.   If you begin to have chest pain that does not go away, become short of breath, or feel dizzy while having palpitations, please call our office or go to the emergency room.   We will see you back in a month to make sure your symptoms are improving and your blood pressure has gone down.   If you have any questions or concerns, please feel free to call the clinic.   Be well,  Dr. Avon Gully

## 2016-05-26 NOTE — Progress Notes (Signed)
   Subjective:    Patient ID: Sara Day, female    DOB: 1966/04/19, 50 y.o.   MRN: [id number]  HPI  Patient presents for palpitations.   Palpitations Patient reports palpitations for the past two weeks. She defines palpitations as "heart beating fast on and off." Denies skipping beats. Reports accompanying chest pain that she describes as heart burn. Not relieved by her usual antacids. Also with one episode of chest tightness. Denies SOB, dizziness. Denies new stressors or anxiety. Does not drink caffeine.  Patient has history of chest pain/palpitations that was worked up in the past with heart cath in 2012. Cath showed no disease. She never followed up with cardiology.   Hypertension Recently bought a home blood pressure cuff, and reports consistently high measurements. Endorses headache and vision changes as well. BP has been consistently high at prior visits. Currently taking only HCTZ 12.5mg  qd.  Review of Systems See HPI.     Objective:   Physical Exam  Constitutional: She is oriented to person, place, and time. She appears well-developed and well-nourished. No distress.  HENT:  Head: Normocephalic and atraumatic.  Cardiovascular: Normal rate, regular rhythm and normal heart sounds.   No murmur heard. Pulmonary/Chest: Effort normal and breath sounds normal. No respiratory distress. She has no wheezes.  Neurological: She is alert and oriented to person, place, and time.  Psychiatric: She has a normal mood and affect. Her behavior is normal.  Vitals reviewed.     Assessment & Plan:  ESSENTIAL HYPERTENSION, BENIGN Poorly controlled. 167/78 today. Reporting headaches and vision changes that may be 2/2 persistent HTN.  - Continue HCTZ 12.5mg  qd - Begin metoprolol 25mg  BID - BMP given HCTZ use - F/u in one month  Heart palpitations Per patient's description, sounds most like tachycardia. EKG obtained which showed only sinus bradycardia, so QT prolongation or WPW unlikely  cause of symptoms. No abnormalities on physical exam. Symptoms could be due to PVCs not captured on EKG, stress/anxiety (though patient denying), or unknown etiology.  - Conservative treatment for now with further work-up indicated if worsening or accompanied by more concerning symptoms - Begin metoprolol in addition to HCTZ. May improve chest pain and decrease HR during episodes, as well as improving HTN.  - Encouraged patient to measure BP and HR during episodes with her home blood pressure cuff. - Return precautions given - F/u in one month  Adin Hector, MD, MPH PGY-2 [facility] Pager [phone]

## 2016-06-02 ENCOUNTER — Ambulatory Visit: Payer: BLUE CROSS/BLUE SHIELD | Admitting: Internal Medicine

## 2016-06-05 IMAGING — CR DG CHEST 1V PORT
1 series · 1 of 1 positions shown · non-contrast
Comparison: 01/21/2012

CLINICAL DATA: Mid chest pain.

EXAM:
PORTABLE CHEST - 1 VIEW

[AP]
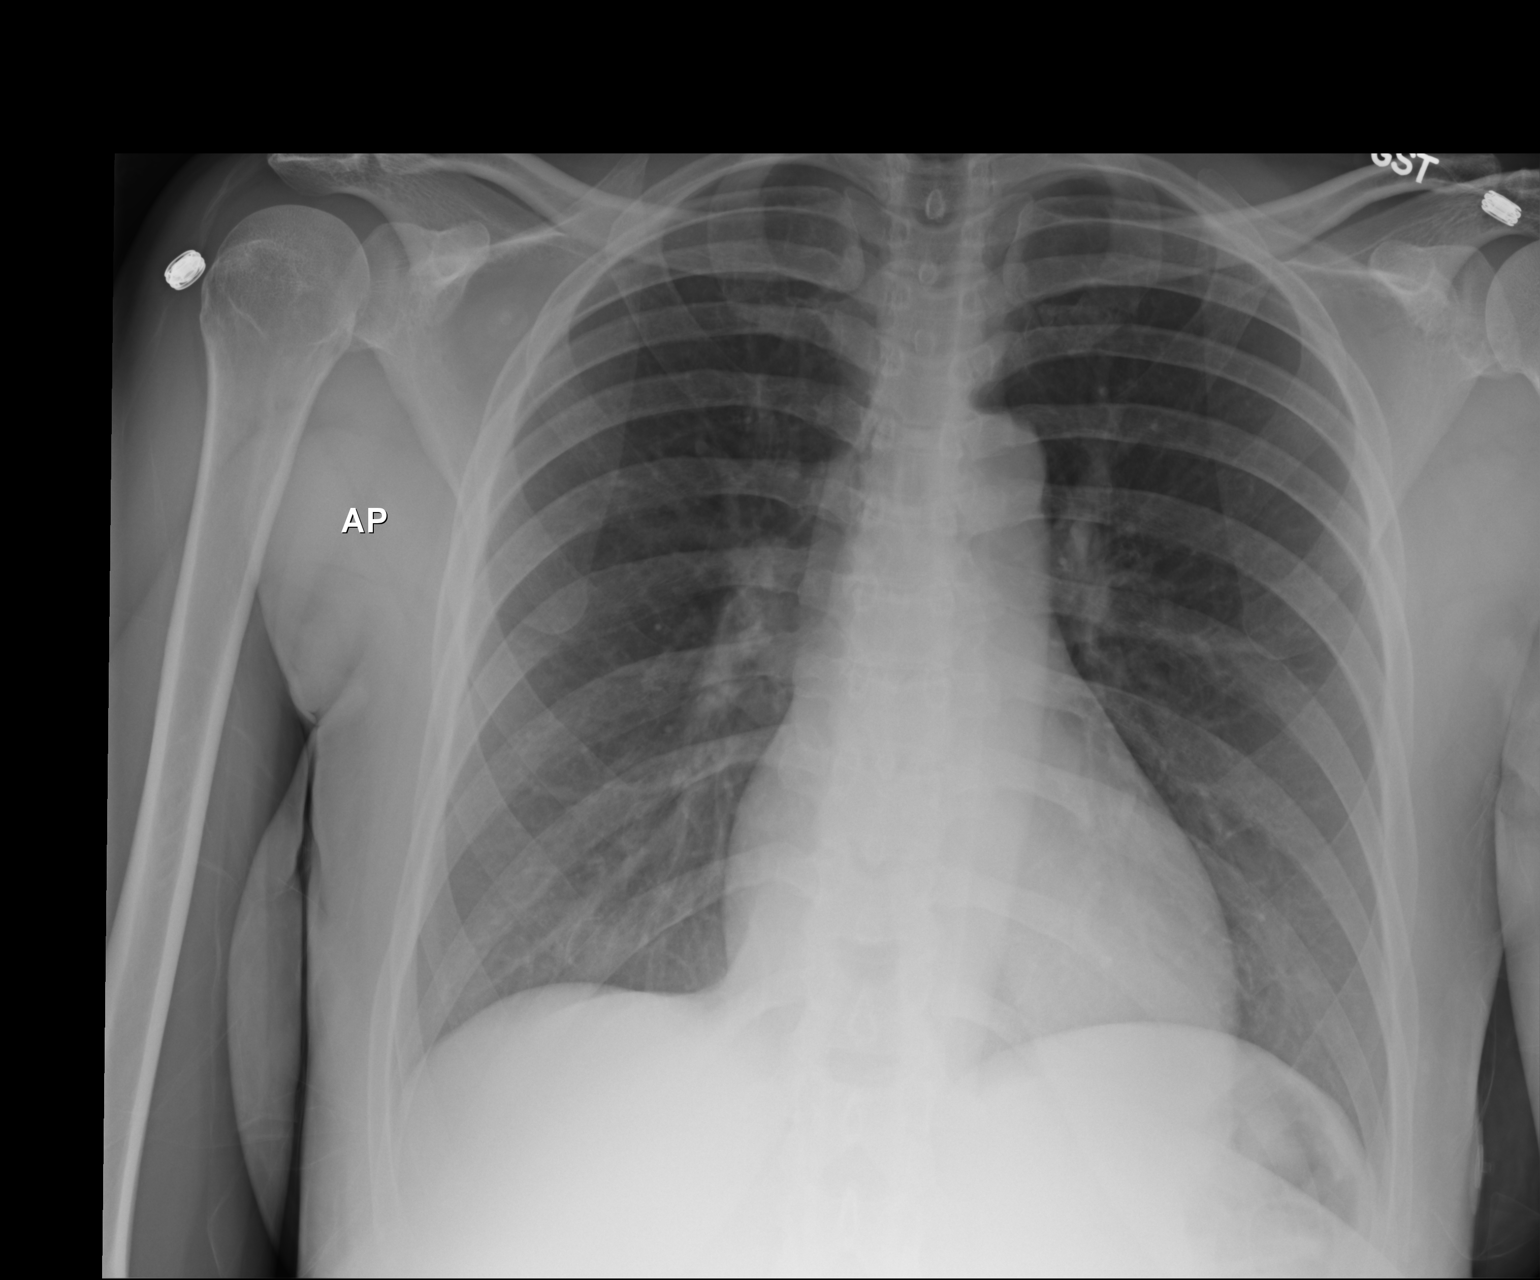

[1 of 1 positions shown; findings below may reference images not displayed]

FINDINGS: The heart size and mediastinal contours are within normal limits.
Both lungs are clear. The visualized skeletal structures are
unremarkable.
IMPRESSION: No active disease.

## 2016-06-09 ENCOUNTER — Ambulatory Visit (INDEPENDENT_AMBULATORY_CARE_PROVIDER_SITE_OTHER): Payer: BLUE CROSS/BLUE SHIELD | Admitting: Internal Medicine

## 2016-06-09 ENCOUNTER — Encounter: Payer: Self-pay | Admitting: Internal Medicine

## 2016-06-09 ENCOUNTER — Other Ambulatory Visit (HOSPITAL_COMMUNITY)
Admission: RE | Admit: 2016-06-09 | Discharge: 2016-06-09 | Disposition: A | Discharge status: Home / Self Care | Payer: BLUE CROSS/BLUE SHIELD | Source: Ambulatory Visit | Attending: Family Medicine | Admitting: Family Medicine

## 2016-06-09 VITALS — BP 144/83 | HR 56 | Temp 98.1°F | Wt 140.8 lb

## 2016-06-09 DIAGNOSIS — M545 Low back pain, unspecified: Secondary | ICD-10-CM

## 2016-06-09 DIAGNOSIS — Z23 Encounter for immunization: Secondary | ICD-10-CM

## 2016-06-09 DIAGNOSIS — Z113 Encounter for screening for infections with a predominantly sexual mode of transmission: Secondary | ICD-10-CM | POA: Insufficient documentation

## 2016-06-09 DIAGNOSIS — I10 Essential (primary) hypertension: Secondary | ICD-10-CM

## 2016-06-09 DIAGNOSIS — R109 Unspecified abdominal pain: Secondary | ICD-10-CM

## 2016-06-09 DIAGNOSIS — R1084 Generalized abdominal pain: Secondary | ICD-10-CM

## 2016-06-09 DIAGNOSIS — N898 Other specified noninflammatory disorders of vagina: Secondary | ICD-10-CM | POA: Diagnosis not present

## 2016-06-09 LAB — POCT WET PREP (WET MOUNT)
CLUE CELLS WET PREP WHIFF POC: NEGATIVE
Trichomonas Wet Prep HPF POC: ABSENT

## 2016-06-09 LAB — POCT URINALYSIS DIPSTICK
Bilirubin, UA: NEGATIVE
GLUCOSE UA: NEGATIVE
Ketones, UA: NEGATIVE
Leukocytes, UA: NEGATIVE
NITRITE UA: NEGATIVE
Protein, UA: NEGATIVE
RBC UA: NEGATIVE
Spec Grav, UA: 1.02
UROBILINOGEN UA: 1
pH, UA: 8

## 2016-06-09 MED ORDER — CYCLOBENZAPRINE HCL 5 MG PO TABS: 5.0000 mg | ORAL_TABLET | Freq: Three times a day (TID) | ORAL | 0 refills | Status: DC | PRN

## 2016-06-09 MED ORDER — HYDROCHLOROTHIAZIDE 12.5 MG PO TABS: 12.5000 mg | ORAL_TABLET | Freq: Every day | ORAL | 5 refills | Status: DC

## 2016-06-09 NOTE — Patient Instructions (Signed)
Ms. Becton,  I will call you with the results of your tests later today. If everything is negative, I recommend trying a muscle relaxant for muscle strain.  You are due for a mammogram and a colonoscopy. Attached is information.  Best, Dr. Ola Spurr

## 2016-06-09 NOTE — Progress Notes (Signed)
[facility] Progress Note  Subjective:  Sara Day is a 50-y/o female who presents for left side pain x 3 weeks.  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 - Had her period 2 weeks ago; has sex with 1 female partner and does not use condoms (used to be on OCP but was discontinued because of HTN) - Has had occasional dysuria and increased frequency (on lasix) - Increased vaginal discharge but no odor ROS: No fevers/chills, no dyspareunia, no dark urine  Palpitations: - Have not recurred since original visit 05/26/16 - Has not taken metoprolol given 2 weeks ago because did not think she needed it  HTN: - Has been "rationing out" her HCTZ because is almost out but has taken consistently this week  Objective: Blood pressure (!) 144/83, pulse (!) 56, temperature 98.1 F (36.7 C), temperature source Oral, weight 140 lb 12.8 oz (63.9 kg). Constitutional: Well-appearing female, in NAD Abdominal: Soft. +BS, mild TTP over lower abdomen, ND, no rebound or guarding.  Musculoskeletal: No midline spinal tenderness. No TTP lateral to spine. No CVA tenderness.  GU: Scant, thick discharge noted at os. No lesions appreciated on speculum exam. No cervical motion tenderness. Psychiatric: Normal mood and affect.  Vitals reviewed  Assessment/Plan: ESSENTIAL HYPERTENSION, BENIGN - Pt has electric BP cuff at home. Gave parameters of 140/90 and asked patient to call if BP above this goal on more than one occasion - Refilled HCTZ - F/u in about 1 month  Flank pain - Could not reproduce on exam. No CVA tenderness and afebrile. UA negative for nitrites/leukocytes/hemoglobin to suggest UTI, pyelo or nephrolithiasis.  - Given duration of symptoms and physical job, recommended trying flexeril prn for possible  muscle strain  Vaginal discharge - With mild abdominal pain but no cervical motion tenderness. Wet prep negative. GC/chlamydia pending.   Follow-up in 1 month for BP. Upon review of chart, also needs repeat pap smear for CIN I in 2015.   Olene Floss, MD [place], PGY-2

## 2016-06-10 DIAGNOSIS — R109 Unspecified abdominal pain: Secondary | ICD-10-CM | POA: Insufficient documentation

## 2016-06-10 LAB — CERVICOVAGINAL ANCILLARY ONLY
Chlamydia: NEGATIVE
NEISSERIA GONORRHEA: NEGATIVE

## 2016-06-10 NOTE — Assessment & Plan Note (Addendum)
-   Pt has electric BP cuff at home. Gave parameters of 140/90 and asked patient to call if BP above this goal on more than one occasion - Refilled HCTZ - F/u in about 1 month

## 2016-06-10 NOTE — Assessment & Plan Note (Signed)
-   With mild abdominal pain but no cervical motion tenderness. Wet prep negative. GC/chlamydia pending.

## 2016-06-10 NOTE — Assessment & Plan Note (Signed)
-   Could not reproduce on exam. No CVA tenderness and afebrile. UA negative for nitrites/leukocytes/hemoglobin to suggest UTI, pyelo or nephrolithiasis.  - Given duration of symptoms and physical job, recommended trying flexeril prn for possible muscle strain

## 2016-07-06 ENCOUNTER — Other Ambulatory Visit: Payer: Self-pay | Admitting: Internal Medicine

## 2016-07-06 MED ORDER — ALBUTEROL SULFATE HFA 108 (90 BASE) MCG/ACT IN AERS: 2.0000 | INHALATION_SPRAY | RESPIRATORY_TRACT | 2 refills | Status: DC | PRN

## 2016-07-06 NOTE — Telephone Encounter (Signed)
Needs refill on albuterol inhaler.  [facility] on [address]

## 2016-10-23 ENCOUNTER — Ambulatory Visit (HOSPITAL_COMMUNITY)
Admission: EM | Admit: 2016-10-23 | Discharge: 2016-10-23 | Disposition: A | Discharge status: Home / Self Care | Payer: BLUE CROSS/BLUE SHIELD | Attending: Emergency Medicine | Admitting: Emergency Medicine

## 2016-10-23 ENCOUNTER — Encounter (HOSPITAL_COMMUNITY): Payer: Self-pay | Admitting: Emergency Medicine

## 2016-10-23 DIAGNOSIS — R1033 Periumbilical pain: Secondary | ICD-10-CM | POA: Diagnosis not present

## 2016-10-23 DIAGNOSIS — N898 Other specified noninflammatory disorders of vagina: Secondary | ICD-10-CM | POA: Diagnosis not present

## 2016-10-23 MED ORDER — IBUPROFEN 600 MG PO TABS: 600.0000 mg | ORAL_TABLET | Freq: Four times a day (QID) | ORAL | 0 refills | Status: DC | PRN

## 2016-10-23 MED ORDER — CLINDAMYCIN PHOSPHATE 2 % VA CREA: 1.0000 | TOPICAL_CREAM | Freq: Every day | VAGINAL | 0 refills | Status: DC

## 2016-10-23 MED ORDER — ONDANSETRON 4 MG PO TBDP
8.0000 mg | ORAL_TABLET | Freq: Once | ORAL | Status: AC
  Administered 2016-10-23: 8 mg via ORAL

## 2016-10-23 MED ORDER — ONDANSETRON 4 MG PO TBDP
4.0000 mg | ORAL_TABLET | Freq: Three times a day (TID) | ORAL | 0 refills | Status: DC | PRN
Start: 2016-10-23 — End: 2017-03-26

## 2016-10-23 NOTE — Discharge Instructions (Signed)
1 g of Tylenol with 600 mg of ibuprofen up to 4 times a day as needed for abdominal pain. Clear liquid diet for the next day and then advance to bland diet as tolerated. Moderate your alcohol intake in the future. You will need to go to the ER if your pain changes, gets worse, if you have chest pain, shortness of breath, if you pass out, or for other concerns.

## 2016-10-23 NOTE — ED Triage Notes (Signed)
Here for constant abd pain onset this am when she woke up associated w/diarrhea and nauseas  Denies fevers, vomiting, urinary sx  Pt smiling and playing on cell phone  A&O x4... NAD

## 2016-10-23 NOTE — ED Provider Notes (Signed)
. HPI  SUBJECTIVE:  Sara Day is a 51 y.o. female who presents with sharp, constant nonmigratory, nonradiating periumbilical pain described as soreness starting at 5 this morning. She reports nausea and one loose, watery, nonbloody bowel movement this morning. She states that she drank almost an entire bottle of wine last night with a steak dinner. She denies vomiting, fevers, coughing, wheezing, chest pain, shortness of breath. No abdominal distention, urinary complaints, back pain, hematochezia, melena. She denies any other raw or undercooked foods, questionable leftovers, travel or camping. She tried Midol and Pepto-Bismol without any improvement in her symptoms. Symptoms are not associated with eating, movement, fasting, urination, defecation. No excess NSAID use or alcohol use. She has a past medical history of hypertension, hypercholesterolemia, GERD, asthma. No history of pancreatitis, gallbladder disease, abdominal surgeries, atrial fibrillation, mesenteric ischemia, smoking, peptic ulcer disease, ulcerative colitis, Crohn's, MI, cardiac disease, alcohol abuse.   She also reports odorous vaginal discharge for the past week. She denies genital rash, itching, pelvic pain, abdominal pain up until today, back pain. She is in a 17 year long-term monogamous relationship with her female sexual partner, who is asymptomatic. STDs are not a concern today. No recent antibiotics.   no aggravating or alleviating factors. She has not tried anything for this. She states this feels identical to previous episodes of BV. She also has past medical history of vaginal yeast infections. No history of gonorrhea, chlamydia, HIV, HSV, syphilis. LMP: 3 weeks ago. PMD: [facility].   Past Medical History:  Diagnosis Date  . Asthma   . Hypertension     History reviewed. No pertinent surgical history.  Family History  Problem Relation Age of Onset  . Heart attack Father     Social History   Substance Use Topics  . Smoking status: Never Smoker  . Smokeless tobacco: Never Used  . Alcohol use Yes     Comment: occasional    No current facility-administered medications for this encounter.   Current Outpatient Prescriptions:  .  albuterol (PROVENTIL HFA) 108 (90 Base) MCG/ACT inhaler, Inhale 2 puffs into the lungs every 4 (four) hours as needed for wheezing., Disp: 18 g, Rfl: 2 .  clindamycin (CLEOCIN) 2 % vaginal cream, Place 1 Applicatorful vaginally at bedtime. X 7 days, Disp: 40 g, Rfl: 0 .  cyclobenzaprine (FLEXERIL) 5 MG tablet, Take 1 tablet (5 mg total) by mouth 3 (three) times daily as needed for muscle spasms., Disp: 30 tablet, Rfl: 0 .  hydrochlorothiazide (HYDRODIURIL) 12.5 MG tablet, Take 1 tablet (12.5 mg total) by mouth daily., Disp: 30 tablet, Rfl: 5 .  ibuprofen (ADVIL,MOTRIN) 600 MG tablet, Take 1 tablet (600 mg total) by mouth every 6 (six) hours as needed., Disp: 30 tablet, Rfl: 0 .  ondansetron (ZOFRAN ODT) 4 MG disintegrating tablet, Take 1 tablet (4 mg total) by mouth every 8 (eight) hours as needed for nausea or vomiting., Disp: 20 tablet, Rfl: 0 .  tobramycin-dexamethasone (TOBRADEX) ophthalmic solution, Place 1 drop into the left eye every 6 (six) hours., Disp: 5 mL, Rfl: 0  Allergies  Allergen Reactions  . Clindamycin/Lincomycin Swelling    Mouth and lip swelling   . Codeine Rash  . Metronidazole Rash  . Sulfa Antibiotics Rash  . Sulfamethoxazole Rash     ROS  As noted in HPI.   Physical Exam  BP 131/87 (BP Location: Left Arm)   Pulse 68   Temp 98 F (36.7 C) (Oral)   Resp 16  LMP 10/02/2016   SpO2 100%   Constitutional: Well developed, well nourished, no acute distress Eyes:  EOMI, conjunctiva normal bilaterally HENT: Normocephalic, atraumatic,mucus membranes moist Respiratory: Normal inspiratory effort Lungs Clear bilaterally Cardiovascular: Normal rate regular rhythm, no murmurs rubs gallops GI: Normal appearance. Soft,  nondistended, active bowel sounds. Positive periumbilical tenderness, no guarding, rebound. No palpable masses. Negative Murphy, negative McBurney, negative Rovsing's. Back: No CVA tenderness GU: Declined. skin: No rash, skin intact Musculoskeletal: no deformities Neurologic: Alert & oriented x 3, no focal neuro deficits Psychiatric: Speech and behavior appropriate   ED Course   Medications  ondansetron (ZOFRAN-ODT) disintegrating tablet 8 mg (8 mg Oral Given 10/23/16 2005)    No orders of the defined types were placed in this encounter.   No results found for this or any previous visit (from the past 24 hour(s)). No results found.  ED Clinical Impression  Periumbilical abdominal pain  Vaginal discharge   ED Assessment/Plan  Presentation most consistent with a gastritis although mild pancreatitis is in the differential. Advised patient that we do not have labs to adequately assess pancreatitis however, she does not have vomiting. She appears comfortable and is moving around the room without any problem. Vitals are normal. She has no peritoneal signs. Feel that this is most likely from excessive alcohol intake. Giving 8 milligrams of Zofran here. Plan to send home with ibuprofen, Tylenol, Zofran, liquid diet may progress to bland diet as tolerated. She is to go immediately to the ER for fevers, worsening of her abdominal pain, if anything changes, or other concerns. Patient is also requesting treatment for her vaginal discharge. States she has BV quite frequently and that her symptoms feel identical to previous BV infections. Offered to do a pelvic exam with a wet prep, but she declined to do this today. She states that she will try clindamycin vaginal gel as this usually works for well for her and she will follow up with her primary care physician or return here if her symptoms are not resolving for reevaluation and pelvic exam at that time. Feel that the patient is low risk for STDs.  Doubt PID.  Patient has a clindamycin allergy listed in Epic, however, discussed this with patient. She states that she is allergic to Flagyl, and not the clindamycin vaginal cream. States that she is tolerated this previously without any complications.  Discussed  MDM, plan and followup with patient. Discussed sn/sx that should prompt return to the ED. Patient  agrees with plan.   Meds ordered this encounter  Medications  . ondansetron (ZOFRAN ODT) 4 MG disintegrating tablet    Sig: Take 1 tablet (4 mg total) by mouth every 8 (eight) hours as needed for nausea or vomiting.    Dispense:  20 tablet    Refill:  0  . ondansetron (ZOFRAN-ODT) disintegrating tablet 8 mg  . clindamycin (CLEOCIN) 2 % vaginal cream    Sig: Place 1 Applicatorful vaginally at bedtime. X 7 days    Dispense:  40 g    Refill:  0  . ibuprofen (ADVIL,MOTRIN) 600 MG tablet    Sig: Take 1 tablet (600 mg total) by mouth every 6 (six) hours as needed.    Dispense:  30 tablet    Refill:  0    *This clinic note was created using [employer]. Therefore, there may be occasional mistakes despite careful proofreading.  ?   Melynda Ripple, MD 10/23/16 2016

## 2017-01-18 ENCOUNTER — Encounter: Payer: Self-pay | Admitting: Family Medicine

## 2017-01-18 ENCOUNTER — Ambulatory Visit (INDEPENDENT_AMBULATORY_CARE_PROVIDER_SITE_OTHER): Payer: BLUE CROSS/BLUE SHIELD | Admitting: Family Medicine

## 2017-01-18 VITALS — BP 110/70 | HR 77 | Temp 98.3°F | Ht 61.0 in | Wt 136.0 lb

## 2017-01-18 DIAGNOSIS — I10 Essential (primary) hypertension: Secondary | ICD-10-CM | POA: Diagnosis not present

## 2017-01-18 DIAGNOSIS — N6310 Unspecified lump in the right breast, unspecified quadrant: Secondary | ICD-10-CM

## 2017-01-18 MED ORDER — CEPHALEXIN 500 MG PO CAPS: 500.0000 mg | ORAL_CAPSULE | Freq: Four times a day (QID) | ORAL | 0 refills | Status: DC

## 2017-01-18 MED ORDER — HYDROCHLOROTHIAZIDE 12.5 MG PO TABS: 12.5000 mg | ORAL_TABLET | Freq: Every day | ORAL | 0 refills | Status: DC

## 2017-01-18 NOTE — Progress Notes (Signed)
   Subjective: CC: breast mass Sara Day is a 51 y.o. female presenting to clinic today for same day appointment. PCP: Darci Needle, MD Concerns today include:  1. Breast mass Patient reports that she discovered a right sided breast mass 3 days ago.  She reports stable size.  She denies nipple discharge, skin changes.  She reports pain.  She reports similar lesion on left 4 years ago that required removal at [facility].  She does not associate this with menstrual cycles.  She denies personal history of breast cancer or other cancers.  She does report family history of breast (paternal grandmother 76's and aunt 33's).  Denies ovarian, prostate cancers.  Last mammogram: several years ago.  no history of abnormal mammogram.  2. HTN Patient reports compliance with antihypertensive.  She is almost out of medication is asking for refills.  No CP, SOB.  Allergies  Allergen Reactions  . Clindamycin/Lincomycin Swelling    Mouth and lip swelling   . Codeine Rash  . Metronidazole Rash  . Sulfa Antibiotics Rash  . Sulfamethoxazole Rash    Social Hx reviewed. MedHx, current medications and allergies reviewed.  Please see EMR. ROS: Per HPI  Objective: Office vital signs reviewed. BP 110/70   Pulse 77   Temp 98.3 F (36.8 C) (Oral)   Ht 5\' 1"  (1.549 m)   Wt 136 lb (61.7 kg)   LMP 12/28/2016   SpO2 99%   BMI 25.70 kg/m   Physical Examination:  General: Awake, alert, well nourished, No acute distress Breast: 1.5cm x 1.5cm well circumscribed, mobile mass near 12 o'clock position just above areola.  Slight overlying erythema.  No induration or fluctuance of mass.  Mild TTP.  No nipple discharge.  Breast otherwise symmetric. Left breast with lateral well healed surgical scar.  Breasts otherwise, without skin changes.  Lymph: no enlarged supraclavicular, axillary or sternal lymph nodes.  Breast exam performed with Christen Bame, RN.  Assessment/ Plan: 51 y.o.  female   1. Breast mass, right.  Patient with new right breast mass.  Stable in size.  Has history of left breast mass requiring excision.  Cannot find documentation of this in chart.  I wonder if this is an infected duct.  I will empirically treat with antibiotics.  Could be cyst vs fibroadenoma.  Paternal family hx with 2 relatives with breast cancer in their 110's.  Cannot rule out breast cancer and no recent mammogram on file.  Diagnostic Mammo ordered.  Patient to schedule. - MM Digital Diagnostic Bilat; Future - cephALEXin (KEFLEX) 500 MG capsule; Take 1 capsule (500 mg total) by mouth 4 (four) times daily. x10 days  Dispense: 40 capsule; Refill: 0 - Will contact patient with further instruction pending mammogram.  2. Essential hypertension, controlled. - hydrochlorothiazide (HYDRODIURIL) 12.5 MG tablet; Take 1 tablet (12.5 mg total) by mouth daily.  Dispense: 30 tablet; Refill: 0 - f/u with PCP   Janora Norlander, DO PGY-3, [facility]

## 2017-01-18 NOTE — Patient Instructions (Signed)
I am treating you for a possible skin infection on your breast.  I have ordered a mammogram to be complete.  I will contact you with the results and refer you to a breast surgeon if needed.

## 2017-01-19 ENCOUNTER — Other Ambulatory Visit: Payer: Self-pay | Admitting: Family Medicine

## 2017-01-19 DIAGNOSIS — N6310 Unspecified lump in the right breast, unspecified quadrant: Secondary | ICD-10-CM

## 2017-01-25 ENCOUNTER — Encounter: Payer: Self-pay | Admitting: Internal Medicine

## 2017-01-26 ENCOUNTER — Ambulatory Visit
Admission: RE | Admit: 2017-01-26 | Discharge: 2017-01-26 | Disposition: A | Discharge status: Home / Self Care | Payer: BLUE CROSS/BLUE SHIELD | Source: Ambulatory Visit | Attending: Family Medicine | Admitting: Family Medicine

## 2017-01-26 DIAGNOSIS — R922 Inconclusive mammogram: Secondary | ICD-10-CM | POA: Diagnosis not present

## 2017-01-26 DIAGNOSIS — N6310 Unspecified lump in the right breast, unspecified quadrant: Secondary | ICD-10-CM

## 2017-01-26 DIAGNOSIS — N6312 Unspecified lump in the right breast, upper inner quadrant: Secondary | ICD-10-CM | POA: Diagnosis not present

## 2017-01-26 DIAGNOSIS — N6311 Unspecified lump in the right breast, upper outer quadrant: Secondary | ICD-10-CM | POA: Diagnosis not present

## 2017-01-26 HISTORY — DX: Unspecified lump in unspecified breast: N63.0

## 2017-02-06 ENCOUNTER — Ambulatory Visit (HOSPITAL_COMMUNITY)
Admission: EM | Admit: 2017-02-06 | Discharge: 2017-02-06 | Disposition: A | Discharge status: Home / Self Care | Payer: BLUE CROSS/BLUE SHIELD | Attending: Family Medicine | Admitting: Family Medicine

## 2017-02-06 ENCOUNTER — Encounter (HOSPITAL_COMMUNITY): Payer: Self-pay | Admitting: Emergency Medicine

## 2017-02-06 DIAGNOSIS — M7918 Myalgia, other site: Secondary | ICD-10-CM

## 2017-02-06 DIAGNOSIS — M791 Myalgia: Secondary | ICD-10-CM | POA: Diagnosis not present

## 2017-02-06 DIAGNOSIS — S161XXA Strain of muscle, fascia and tendon at neck level, initial encounter: Secondary | ICD-10-CM | POA: Diagnosis not present

## 2017-02-06 MED ORDER — DICLOFENAC SODIUM 75 MG PO TBEC: 75.0000 mg | DELAYED_RELEASE_TABLET | Freq: Two times a day (BID) | ORAL | 1 refills | Status: DC

## 2017-02-06 MED ORDER — CYCLOBENZAPRINE HCL 5 MG PO TABS: 5.0000 mg | ORAL_TABLET | Freq: Every day | ORAL | 1 refills | Status: DC
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
## 2017-02-23 ENCOUNTER — Ambulatory Visit: Payer: BLUE CROSS/BLUE SHIELD | Admitting: Internal Medicine

## 2017-03-26 ENCOUNTER — Ambulatory Visit (INDEPENDENT_AMBULATORY_CARE_PROVIDER_SITE_OTHER): Payer: BLUE CROSS/BLUE SHIELD | Admitting: Family Medicine

## 2017-03-26 ENCOUNTER — Encounter: Payer: Self-pay | Admitting: Family Medicine

## 2017-03-26 VITALS — BP 127/60 | HR 64 | Temp 98.4°F | Wt 140.4 lb

## 2017-03-26 DIAGNOSIS — K219 Gastro-esophageal reflux disease without esophagitis: Secondary | ICD-10-CM | POA: Diagnosis not present

## 2017-03-26 DIAGNOSIS — N926 Irregular menstruation, unspecified: Secondary | ICD-10-CM

## 2017-03-26 LAB — POCT URINE PREGNANCY: Preg Test, Ur: NEGATIVE

## 2017-03-26 MED ORDER — OMEPRAZOLE 40 MG PO CPDR: 40.0000 mg | DELAYED_RELEASE_CAPSULE | Freq: Every day | ORAL | 3 refills | Status: DC

## 2017-03-26 MED ORDER — OMEPRAZOLE 20 MG PO CPDR: 20.0000 mg | DELAYED_RELEASE_CAPSULE | Freq: Every day | ORAL | 3 refills | Status: DC

## 2017-03-26 NOTE — Patient Instructions (Signed)
Thank you for coming in to see Sara Day today. Please see below to review our plan for today's visit.  Your symptoms are consistent with acid reflux. I have prescribed a medication called Prilosec she will take 20 mg daily. It is important to eat 3 hours prior to going to sleep. Exercise prior to eating.  Please call the clinic at [phone] if your symptoms worsen or you have any concerns. It was my pleasure to see you. -- Harriet But[place], [place], PGY-1   Esophagitis Esophagitis is inflammation of the esophagus. The esophagus is the tube that carries food and liquids from your mouth to your stomach. Esophagitis can cause soreness or pain in the esophagus. This condition can make it difficult and painful to swallow. What are the causes? Most causes of esophagitis are not serious. Common causes of this condition include:  Gastroesophageal reflux disease (GERD). This is when stomach contents move back up into the esophagus (reflux).  Repeated vomiting.  An allergic-type reaction, especially caused by food allergies (eosinophilic esophagitis).  Injury to the esophagus by swallowing large pills with or without water, or swallowing certain types of medicines.  Swallowing (ingesting) harmful chemicals, such as household cleaning products.  Heavy alcohol use.  An infection of the esophagus.This most often occurs in people who have a weakened immune system.  Radiation or chemotherapy treatment for cancer.  Certain diseases such as sarcoidosis, Crohn disease, and scleroderma.  What are the signs or symptoms? Symptoms of this condition include:  Difficult or painful swallowing.  Pain with swallowing acidic liquids, such as citrus juices.  Pain with burping.  Chest pain.  Difficulty breathing.  Nausea.  Vomiting.  Pain in the abdomen.  Weight loss.  Ulcers in the mouth.  Patches of white material in the mouth (candidiasis).  Fever.  Coughing up  blood or vomiting blood.  Stool that is black, tarry, or bright red.  How is this diagnosed? Your health care provider will take a medical history and perform a physical exam. You may also have other tests, including:  An endoscopy to examine your stomach and esophagus with a small camera.  A test that measures the acidity level in your esophagus.  A test that measures how much pressure is on your esophagus.  A barium swallow or modified barium swallow to show the shape, size, and functioning of your esophagus.  Allergy tests.  How is this treated? Treatment for this condition depends on the cause of your esophagitis. In some cases, steroids or other medicines may be given to help relieve your symptoms or to treat the underlying cause of your condition. You may have to make some lifestyle changes, such as:  Avoiding alcohol.  Quitting smoking.  Changing your diet.  Exercising.  Changing your sleep habits and your sleep environment.  Follow these instructions at home: Take these actions to decrease your discomfort and to help avoid complications. Diet  Follow a diet as recommended by your health care provider. This may involve avoiding foods and drinks such as: ? Coffee and tea (with or without caffeine). ? Drinks that contain alcohol. ? Energy drinks and sports drinks. ? Carbonated drinks or sodas. ? Chocolate and cocoa. ? Peppermint and mint flavorings. ? Garlic and onions. ? Horseradish. ? Spicy and acidic foods, including peppers, chili powder, curry powder, vinegar, hot sauces, and barbecue sauce. ? Citrus fruit juices and citrus fruits, such as oranges, lemons, and limes. ? Tomato-based foods, such as red sauce, chili,  salsa, and pizza with red sauce. ? Fried and fatty foods, such as donuts, french fries, potato chips, and high-fat dressings. ? High-fat meats, such as hot dogs and fatty cuts of red and white meats, such as rib eye steak, sausage, ham, and  bacon. ? High-fat dairy items, such as whole milk, butter, and cream cheese.  Eat small, frequent meals instead of large meals.  Avoid drinking large amounts of liquid with your meals.  Avoid eating meals during the 2-3 hours before bedtime.  Avoid lying down right after you eat.  Do not exercise right after you eat.  Avoid foods and drinks that seem to make your symptoms worse. General instructions  Pay attention to any changes in your symptoms.  Take over-the-counter and prescription medicines only as told by your health care provider. Do not take aspirin, ibuprofen, or other NSAIDs unless your health care provider told you to do so.  If you have trouble taking pills, use a pill splitter to decrease the size of the pill. This will decrease the chance of the pill getting stuck or injuring your esophagus on the way down. Also, drink water after you take a pill.  Do not use any tobacco products, including cigarettes, chewing tobacco, and e-cigarettes. If you need help quitting, ask your health care provider.  Wear loose-fitting clothing. Do not wear anything tight around your waist that causes pressure on your abdomen.  Raise (elevate) the head of your bed about 6 inches (15 cm).  Try to reduce your stress, such as with yoga or meditation. If you need help reducing stress, ask your health care provider.  If you are overweight, reduce your weight to an amount that is healthy for you. Ask your health care provider for guidance about a safe weight loss goal.  Keep all follow-up visits as told by your health care provider. This is important. Contact a health care provider if:  You have new symptoms.  You have unexplained weight loss.  You have difficulty swallowing, or it hurts to swallow.  You have wheezing or a persistent cough.  Your symptoms do not improve with treatment.  You have frequent heartburn for more than two weeks. Get help right away if:  You have severe pain  in your arms, neck, jaw, teeth, or back.  You feel sweaty, dizzy, or light-headed.  You have chest pain or shortness of breath.  You vomit and your vomit looks like blood or coffee grounds.  Your stool is bloody or black.  You have a fever.  You cannot swallow, drink, or eat. This information is not intended to replace advice given to you by your health care provider. Make sure you discuss any questions you have with your health care provider. Document Released: 11/12/2004 Document Revised: 03/12/2016 Document Reviewed: 01/30/2015 Elsevier Interactive Patient Education  [employer].

## 2017-03-26 NOTE — Progress Notes (Signed)
   Subjective:   Patient ID: Sara Day    DOB: [birth year], 51 y.o. female   MRN: [id number]  CC: "Reflux"  HPI: LEMMA TETRO is a 51 y.o. female who presents to clinic today for reflux. Problems discussed today are as follows:  Reflux: Patient has a long history of acid reflux which resolved 1 year ago. No onset 1 month ago with recurrence of her symptoms. Patient denies NSAID use. She has a burning sensation in her throat without vomiting. Patient states she has recently started incorporating cardio exercising to her daily routine approximately 1 hour after eating dinner. Patient previously on Prilosec with success. ROS: Denies fevers or chills, nausea or vomiting, change in weight or appetite, abdominal pain, diarrhea, melena or hematochezia.  Complete ROS performed, see HPI for pertinent.  [place]: HTN, asthma, GERD, CIN1, HLD. Smoking status reviewed. Medications reviewed.  Objective:   BP 127/60 (BP Location: Left Arm, Patient Position: Sitting, Cuff Size: Normal)   Pulse 64   Temp 98.4 F (36.9 C) (Oral)   Wt 140 lb 6.4 oz (63.7 kg)   LMP 01/17/2017 (Approximate)   SpO2 93%   BMI 26.53 kg/m  Vitals and nursing note reviewed.  General: well nourished, well developed, in no acute distress with non-toxic appearance HEENT: normocephalic, atraumatic, moist mucous membranes Neck: supple, non-tender without lymphadenopathy CV: regular rate and rhythm without murmurs, rubs, or gallops, no lower extremity edema Lungs: clear to auscultation bilaterally with normal work of breathing Abdomen: soft, non-tender, non-distended, no masses or organomegaly palpable, normoactive bowel sounds Skin: warm, dry, no rashes or lesions, cap refill < 2 seconds Extremities: warm and well perfused, normal tone  Assessment & Plan:   GERD Chronic. Worsening. No good flax with weight loss, dysphasia. Previously on Prilosec with success. Urine pregnancy test negative. --Prilosec 20 mg  daily  Orders Placed This Encounter  Procedures  . POCT urine pregnancy   Meds ordered this encounter  Medications  . DISCONTD: omeprazole (PRILOSEC) 40 MG capsule    Sig: Take 1 capsule (40 mg total) by mouth daily.    Dispense:  30 capsule    Refill:  3  . omeprazole (PRILOSEC) 20 MG capsule    Sig: Take 1 capsule (20 mg total) by mouth daily.    Dispense:  90 capsule    Refill:  [place], [place], PGY-1 03/26/2017 3:53 PM

## 2017-03-26 NOTE — Assessment & Plan Note (Addendum)
Chronic. Worsening. No good flax with weight loss, dysphasia. Previously on Prilosec with success. Urine pregnancy test negative. --Prilosec 20 mg daily

## 2017-03-31 ENCOUNTER — Other Ambulatory Visit: Payer: Self-pay | Admitting: Internal Medicine

## 2017-03-31 DIAGNOSIS — K219 Gastro-esophageal reflux disease without esophagitis: Secondary | ICD-10-CM

## 2017-03-31 MED ORDER — OMEPRAZOLE 20 MG PO CPDR: 20.0000 mg | DELAYED_RELEASE_CAPSULE | Freq: Every day | ORAL | 3 refills | Status: DC

## 2017-03-31 NOTE — Telephone Encounter (Signed)
Medication sent to [facility] per patient request.  Martin, Tamika L, RN  

## 2017-03-31 NOTE — Telephone Encounter (Signed)
Pt states Omeprazole was sent to the wrong pharmacy. Pt uses [facility] on [address]. ep

## 2017-04-06 ENCOUNTER — Telehealth: Payer: Self-pay | Admitting: Internal Medicine

## 2017-04-06 NOTE — Telephone Encounter (Signed)
Pt has a MRI on Tuesday and needs something for anxiety. Pt uses [facility] on [address]. ep

## 2017-04-07 MED ORDER — LORAZEPAM 1 MG PO TABS: ORAL_TABLET | ORAL | 0 refills | Status: DC
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
## 2017-04-07 NOTE — Telephone Encounter (Signed)
Called patient's pharmacy and left message for Rx for ativan 1 mg, quantity 1 tablet, with instructions to take 0.5-1 tablet for anxiety prior to MRI.  Olene Floss, MD [place], PGY-2

## 2017-04-07 NOTE — Telephone Encounter (Signed)
Do not see record of MRI. Was not ordered by our office. Please ask patient to contact office of provider that is arranging MRI for prescription. Thank you.

## 2017-06-24 ENCOUNTER — Ambulatory Visit (INDEPENDENT_AMBULATORY_CARE_PROVIDER_SITE_OTHER): Payer: BLUE CROSS/BLUE SHIELD | Admitting: Internal Medicine

## 2017-06-24 ENCOUNTER — Encounter: Payer: Self-pay | Admitting: Internal Medicine

## 2017-06-24 VITALS — BP 140/76 | HR 62 | Temp 98.5°F | Ht 61.0 in | Wt 137.6 lb

## 2017-06-24 DIAGNOSIS — N912 Amenorrhea, unspecified: Secondary | ICD-10-CM | POA: Diagnosis not present

## 2017-06-24 DIAGNOSIS — N898 Other specified noninflammatory disorders of vagina: Secondary | ICD-10-CM

## 2017-06-24 NOTE — Assessment & Plan Note (Signed)
Likely physiologic, as occurs relatively frequently and is not associated with irritation, itching, or foul odor. Patient realized when she went to bathroom to provide urine sample that her period had started, so pregnancy test not performed. Likely explanation for recent abdominal cramping as well. Discussed that some vaginal discharge is normal and that it can change in appearance throughout the month. Advised patient to return if discharge becomes irritation or itchy, or if develops an odor.

## 2017-06-24 NOTE — Progress Notes (Signed)
   Subjective:   Patient: Sara Day       Birthdate: 04-12-1966       MRN: [id number]      HPI  Sara Day is a 51 y.o. female presenting for same day appointment for vaginal discharge.   Vaginal discharge Began about two days ago. Also with accompanying mild cramps. Says discharge is different from her normal discharge, however she does have this type of discharge fairly regularly. It is either yellow or white and thick. Denies vaginal irritation or itching. Denies odor, denies blood. Says she was diagnosed with BV about two months ago and completed a course of MetroGel at that time. Has not taken any other antibiotics since then. Was previously taking OCPs, but stopped due to HTN. Is sexually active with one partner but is not on any form of contraception. LMP was 5 weeks ago. She says she is about 5 days late and is concerned she may be pregnant.   Smoking status reviewed. Patient is never smoker.   Review of Systems See HPI.     Objective:  Physical Exam  Constitutional: She is oriented to person, place, and time and well-developed, well-nourished, and in no distress.  HENT:  Head: Normocephalic and atraumatic.  Eyes: Conjunctivae and EOM are normal. Right eye exhibits no discharge. Left eye exhibits no discharge.  Pulmonary/Chest: Effort normal. No respiratory distress.  Abdominal: Soft. She exhibits no distension. There is no tenderness.  Neurological: She is alert and oriented to person, place, and time.  Skin: Skin is warm and dry.  Psychiatric: Affect and judgment normal.      Assessment & Plan:  Vaginal discharge Likely physiologic, as occurs relatively frequently and is not associated with irritation, itching, or foul odor. Patient realized when she went to bathroom to provide urine sample that her period had started, so pregnancy test not performed. Likely explanation for recent abdominal cramping as well. Discussed that some vaginal discharge is normal and that  it can change in appearance throughout the month. Advised patient to return if discharge becomes irritation or itchy, or if develops an odor.    Adin Hector, MD, MPH PGY-3 [facility] Pager [phone]

## 2017-06-24 NOTE — Patient Instructions (Addendum)
It was nice seeing you again today Ms. Tith!  If you continue to have unusual vaginal discharge after your period is over, please call to schedule another appointment. If you begin having vaginal itching or irritation, please call us sooner.   If you have any questions or concerns, please feel free to call the clinic.   Be well,  Dr. Avon Gully

## 2017-07-05 ENCOUNTER — Ambulatory Visit (INDEPENDENT_AMBULATORY_CARE_PROVIDER_SITE_OTHER): Payer: BLUE CROSS/BLUE SHIELD | Admitting: Internal Medicine

## 2017-07-05 ENCOUNTER — Encounter: Payer: Self-pay | Admitting: Internal Medicine

## 2017-07-05 DIAGNOSIS — H1031 Unspecified acute conjunctivitis, right eye: Secondary | ICD-10-CM | POA: Diagnosis not present

## 2017-07-05 DIAGNOSIS — H109 Unspecified conjunctivitis: Secondary | ICD-10-CM | POA: Insufficient documentation

## 2017-07-05 MED ORDER — POLYMYXIN B-TRIMETHOPRIM 10000-0.1 UNIT/ML-% OP SOLN: 1.0000 [drp] | Freq: Four times a day (QID) | OPHTHALMIC | 0 refills | Status: DC

## 2017-07-05 NOTE — Patient Instructions (Addendum)
It was nice seeing you again today Ms. Hackler!  Please begin using the antibiotic eye drops. Use the drops in both eyes every 6 hours until you have not had any redness or discharge for 24 hours. You can wear your contacts again after you have not had any discharge for at least 24 hours. Keep in mind that symptoms can get worse before they get better.   To help with the irritation and burning, you can place a warm compress over your eye a few times a day.   The infection may spread to your other eye, so do not be alarmed if you develop symptoms in that eye as well.   If you are not getting any better in about 5 days, please let us know.   If you have any questions or concerns, please feel free to call the clinic.   Be well,  Dr. Avon Gully

## 2017-07-05 NOTE — Progress Notes (Signed)
   Subjective:   Patient: Sara Day       Birthdate: [birth year]       MRN: [id number]      HPI  Sharay LYNNIX SCHONEMAN is a 51 y.o. female presenting for same day appointment for eye redness.   Eye redness Patient first noticed itching in her R eye last week, but last night developed significant redness in that eye. This morning woke up with discharge in the R eye as well. Also reports burning and general irritation in that eye as well as increased tearing. Has been using Visine with no improvement in symptoms. Denies vision changes. Has history of seasonal allergies for which she takes OTC meds but says this has not helped at all. Endorses rhinorrhea and sore throat recently; denies fevers. 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 Does wear contacts. Is not wearing them currently.   Smoking status reviewed. Patient is never smoker.   Review of Systems See HPI.     Objective:  Physical Exam  Constitutional: She is oriented to person, place, and time and well-developed, well-nourished, and in no distress.  HENT:  Head: Normocephalic and atraumatic.  Mild conjunctival injection of R eye. No abnormalities of L eye. PERRLA, EOMI. No discharge noted.   Pulmonary/Chest: Effort normal. No respiratory distress.  Neurological: She is alert and oriented to person, place, and time.  Skin: Skin is warm and dry.  Psychiatric: Affect and judgment normal.      Assessment & Plan:  Conjunctivitis Likely viral, however patient contact lens wearer, so at increased risk for bacterial infection. No vision changes or other red flags. Less concern for keratitis given physical exam and only mild symptoms. Will begin antibiotic treatment with strict return precautions.  - Begin trimethoprim polymyxin ophthalmic drops QID until symptoms have resolved x24hrs - Discussed importance of not wearing contacts until no discharge for at least 24 hours - F/u if no improvement in about  5 days   Adin Hector, MD, MPH PGY-3 [place] Pager [phone]

## 2017-07-05 NOTE — Assessment & Plan Note (Signed)
Likely viral, however patient contact lens wearer, so at increased risk for bacterial infection. No vision changes or other red flags. Less concern for keratitis given physical exam and only mild symptoms. Will begin antibiotic treatment with strict return precautions.  - Begin trimethoprim polymyxin ophthalmic drops QID until symptoms have resolved x24hrs - Discussed importance of not wearing contacts until no discharge for at least 24 hours - F/u if no improvement in about 5 days

## 2017-07-08 ENCOUNTER — Ambulatory Visit (INDEPENDENT_AMBULATORY_CARE_PROVIDER_SITE_OTHER): Payer: BLUE CROSS/BLUE SHIELD | Admitting: Family Medicine

## 2017-07-08 ENCOUNTER — Other Ambulatory Visit (HOSPITAL_COMMUNITY)
Admission: RE | Admit: 2017-07-08 | Discharge: 2017-07-08 | Disposition: A | Discharge status: Home / Self Care | Payer: BLUE CROSS/BLUE SHIELD | Source: Ambulatory Visit | Attending: Family Medicine | Admitting: Family Medicine

## 2017-07-08 VITALS — BP 118/80 | HR 64 | Temp 98.3°F | Ht 61.0 in | Wt 138.8 lb

## 2017-07-08 DIAGNOSIS — Z0000 Encounter for general adult medical examination without abnormal findings: Secondary | ICD-10-CM | POA: Insufficient documentation

## 2017-07-08 DIAGNOSIS — Z124 Encounter for screening for malignant neoplasm of cervix: Secondary | ICD-10-CM

## 2017-07-08 DIAGNOSIS — N898 Other specified noninflammatory disorders of vagina: Secondary | ICD-10-CM | POA: Diagnosis not present

## 2017-07-08 LAB — POCT WET PREP (WET MOUNT)
CLUE CELLS WET PREP WHIFF POC: POSITIVE
TRICHOMONAS WET PREP HPF POC: ABSENT

## 2017-07-08 NOTE — Assessment & Plan Note (Signed)
Likely physiologic as no odor, itching, or irritation noted. Provided re-assurance that some vaginal discharge is normal and can change throughout the month. Advised patient against using daily douching as this may increase patient's chance of infection.  -will obtain wet prep today to rule out infectious origin -advised to follow up if discharge begins to form odor, or patient notes itching/irritation

## 2017-07-08 NOTE — Assessment & Plan Note (Addendum)
Visit for pap smear. Cervix was visualized and sample was sent with associated HPV cytology. Patient was informed that she will be made aware of results.  -informed patient that she will need to make a follow up appointment with her PCP for annual exam and discussed importance of meeting her PCP

## 2017-07-08 NOTE — Patient Instructions (Signed)
Pap Test Why am I having this test? A pap test is sometimes called a pap smear. It is a screening test that is used to check for signs of cancer of the vagina, cervix, and uterus. The test can also identify the presence of infection or precancerous changes. Your health care provider will likely recommend you have this test done on a regular basis. This test may be done:  Every 3 years, starting at age 51.  Every 5 years, in combination with testing for the presence of human papillomavirus (HPV).  More or less often depending on other medical conditions.  What kind of sample is taken? Using a small cotton swab, plastic spatula, or brush, your health care provider will collect a sample of cells from the surface of your cervix. Your cervix is the opening to your uterus, also called a womb. Secretions from the cervix and vagina may also be collected. How do I prepare for this test?  Be aware of where you are in your menstrual cycle. You may be asked to reschedule the test if you are menstruating on the day of the test.  You may need to reschedule if you have a known vaginal infection on the day of the test.  You may be asked to avoid douching or taking a bath the day before or the day of the test.  Some medicines can cause abnormal test results, such as digitalis and tetracycline. Talk with your health care provider before your test if you take one of these medicines. What do the results mean? Abnormal test results may indicate a number of health conditions. These may include:  Cancer. Although pap test results cannot be used to diagnose cancer of the cervix, vagina, or uterus, they may suggest the possibility of cancer. Further tests would be required to determine if cancer is present.  Sexually transmitted disease.  Fungal infection.  Parasite infection.  Herpes infection.  A condition causing or contributing to infertility.  It is your responsibility to obtain your test results.  Ask the lab or department performing the test when and how you will get your results. Contact your health care provider to discuss any questions you have about your results. Talk with your health care provider to discuss your results, treatment options, and if necessary, the need for more tests. Talk with your health care provider if you have any questions about your results. This information is not intended to replace advice given to you by your health care provider. Make sure you discuss any questions you have with your health care provider. Document Released: 12/26/2002 Document Revised: 06/10/2016 Document Reviewed: 02/26/2014 Elsevier Interactive Patient Education  [employer].   It was a pleasure meeting you today.   Today we discussed your pap smear and vaginal discharge. We will call you or send a letter with your results.   Please follow up with your PCP for an annual exam or sooner if symptoms persist or worsen.  Our clinic's number is [phone]. Please call with questions or concerns.   Thank you,  Caroline More, DO

## 2017-07-08 NOTE — Progress Notes (Signed)
   Subjective:    Patient ID: Sara Day, female    DOB: [birth year], 51 y.o.   MRN: [id number]   CC: Pap smear  HPI:  Patient was scheduled today for colposcopy clinic. When reviewing medical chart it was seen patient had a Negative pap in 2015. Cervical biopsy in 2015 showing LOW GRADE SQUAMOUS INTRAEPITHELIAL LESION, CIN-I (MILD DYSPLASIA). Patient has not had repeat pap smear since and is due for pap smear with cytology according to guidelines.  Patient made appointment for colposcopy clinic rather than with PCP but accident thinking that this was colonoscopy clinic. However since patient was overdue to pap smear she was agreeable for testing today.  Patient also complains of vaginal discharge x 1 week. States discharge is white and thick. Patient has seen PCP in past for discharge and told it was normal. Patient requesting testing for possible infection. Denies odor, itching, or irritation. Patient reports daily douching to "keep me clean". Patient also reports abdominal cramping x 1 week as well.   Patient is sexually active with 1 partner x 18 years. Patient is no longer having periods.   Review of Systems All negative other than noted in HPI  Objective:  BP 118/80 (BP Location: Left Arm, Patient Position: Sitting, Cuff Size: Normal)   Pulse 64   Temp 98.3 F (36.8 C) (Oral)   Ht 5\' 1"  (1.549 m)   Wt 138 lb 12.8 oz (63 kg)   LMP 06/24/2017 (Approximate)   SpO2 98%   BMI 26.23 kg/m  Vitals and nursing note reviewed  General: well nourished, in no acute distress Skin: warm and dry, no rashes noted Neuro: alert and oriented, no focal deficits Female genitalia: normal external genitalia, vulva, vagina, cervix, uterus and adnexa Cervix: not indicated, cervical discharge present - white and creamy, cervical motion tenderness absent and lesions absent    Assessment & Plan:    Vaginal discharge Likely physiologic as no odor, itching, or irritation noted. Provided  re-assurance that some vaginal discharge is normal and can change throughout the month. Advised patient against using daily douching as this may increase patient's chance of infection.  -will obtain wet prep today to rule out infectious origin -advised to follow up if discharge begins to form odor, or patient notes itching/irritation  Pap smear for cervical cancer screening Visit for pap smear. Cervix was visualized and sample was sent with associated HPV cytology. Patient was informed that she will be made aware of results.  -informed patient that she will need to make a follow up appointment with her PCP for annual exam and discussed importance of meeting her PCP       Return if symptoms worsen or fail to improve, for Annual wellness exam.   Caroline More, DO, PGY-1

## 2017-07-09 LAB — CYTOLOGY - PAP
Diagnosis: NEGATIVE
HPV (WINDOPATH): NOT DETECTED

## 2017-07-12 ENCOUNTER — Encounter: Payer: Self-pay | Admitting: Family Medicine

## 2017-07-13 ENCOUNTER — Encounter: Payer: BLUE CROSS/BLUE SHIELD | Admitting: Internal Medicine

## 2017-07-14 ENCOUNTER — Encounter: Payer: Self-pay | Admitting: Internal Medicine

## 2017-07-14 ENCOUNTER — Ambulatory Visit (INDEPENDENT_AMBULATORY_CARE_PROVIDER_SITE_OTHER): Payer: BLUE CROSS/BLUE SHIELD | Admitting: Internal Medicine

## 2017-07-14 VITALS — BP 166/84 | HR 56 | Temp 98.3°F | Ht 61.0 in | Wt 139.0 lb

## 2017-07-14 DIAGNOSIS — Z23 Encounter for immunization: Secondary | ICD-10-CM | POA: Diagnosis not present

## 2017-07-14 DIAGNOSIS — I10 Essential (primary) hypertension: Secondary | ICD-10-CM

## 2017-07-14 DIAGNOSIS — E663 Overweight: Secondary | ICD-10-CM

## 2017-07-14 DIAGNOSIS — K219 Gastro-esophageal reflux disease without esophagitis: Secondary | ICD-10-CM | POA: Diagnosis not present

## 2017-07-14 MED ORDER — OMEPRAZOLE 20 MG PO CPDR: 20.0000 mg | DELAYED_RELEASE_CAPSULE | Freq: Every day | ORAL | 3 refills | Status: DC

## 2017-07-14 MED ORDER — ALBUTEROL SULFATE HFA 108 (90 BASE) MCG/ACT IN AERS: 2.0000 | INHALATION_SPRAY | RESPIRATORY_TRACT | 2 refills | Status: DC | PRN

## 2017-07-14 MED ORDER — HYDROCHLOROTHIAZIDE 12.5 MG PO TABS: 12.5000 mg | ORAL_TABLET | Freq: Every day | ORAL | 1 refills | Status: DC

## 2017-07-14 MED ORDER — METRONIDAZOLE 0.75 % VA GEL: 1.0000 | Freq: Every day | VAGINAL | 0 refills | Status: DC

## 2017-07-14 NOTE — Progress Notes (Signed)
[facility] Progress Note  Subjective:  Sara Day is a 51 y.o. female with history of HTN, asthma, depression, and breast mass who presents for general check-up.  Concern:  - Wondering about wet prep results from last week as has noted increased discharge. Has only recently stopped douching.  - Has a lingering cough  #HTN: - Taking HCTZ 12.5 mg daily. Denies trouble taking this or missing doses.  - Does not add salt to foods but has not been monitoring her salt intake.  #Asthma: - Rarely needs to use inhaler.   Health maintenance:  - Had pap 07/08/17 that had normal cytology and was negative for HPV. 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 Clindamycin/Lincomycin Swelling    Mouth and lip swelling   . Codeine Rash  . Metronidazole Rash  . Sulfa Antibiotics Rash  . Sulfamethoxazole Rash    Objective: Blood pressure (!) 166/84, pulse (!) 56, temperature 98.3 F (36.8 C), temperature source Oral, height 5\' 1"  (1.549 m), weight 139 lb (63 kg), last menstrual period 06/24/2017, SpO2 99 %. Body mass index is 26.26 kg/m. Constitutional: Overweight female in NAD HENT: MMM, normal posterior oropharynx, swollen and erythematous nasal turbinates Cardiovascular: RRR, S1, S2, no m/r/g.  Pulmonary/Chest: Effort normal and breath sounds normal.  Abdominal: Soft. +BS, NT, ND Musculoskeletal: No LE edema Neurological: AOx3, no focal deficits. Skin: Skin is warm and dry. No rash noted.  Psychiatric: Normal mood and affect.  Vitals reviewed  PHQ2: 0   Assessment/Plan: ESSENTIAL HYPERTENSION, BENIGN -  Above goal of 140/90. Patient does endorse recent salty meal of porkchops. - Asked patient to return for recheck. If still elevated, would increase HCTZ to 25 mg.  - Counseled patient to look at food labels and try to consume no more than 2 g of salt daily. Recommended getting regular exercise, ideally 150 minutes or more per week.  - Will calculate ASCVD risk with lipid panel. Will check CMP. These were placed as future labs, as patient prefers to have drawn with BP follow-up.   Recommended increasing nasal steroid to twice daily for suspected post-nasal drip cough.   Health Maintenance: flu shot administered; provided handout on colonoscopy  Follow-up in 1-2 weeks for blood pressure recheck.  Olene Floss, MD [place], PGY-3

## 2017-07-14 NOTE — Patient Instructions (Signed)
Sara Day,  You do have bacterial vaginosis. Please use metrogel nightly for the next 5 days. Refrain from douching.  Increase nasal spray to twice daily for cough.  Please return in 1-2 weeks for blood pressure recheck. I have refilled your hydrochlorothiazide. I may need to increase this based on your next blood pressure check. Look at food labels and try to limit salt to no more than 2g a day.  You are due for colon cancer screening.  I will order future labs of lipid panel (for cholesterol) and a metabolic panel to check your electrolytes because of your blood pressure medication.  Best, Dr. Ola Spurr

## 2017-07-16 NOTE — Assessment & Plan Note (Addendum)
-   Above goal of 140/90. Patient does endorse recent salty meal of porkchops. - Asked patient to return for recheck. If still elevated, would increase HCTZ to 25 mg.  - Counseled patient to look at food labels and try to consume no more than 2 g of salt daily. Recommended getting regular exercise, ideally 150 minutes or more per week.  - Will calculate ASCVD risk with lipid panel. Will check CMP. These were placed as future labs, as patient prefers to have drawn with BP follow-up.

## 2017-09-10 ENCOUNTER — Other Ambulatory Visit: Payer: Self-pay | Admitting: Internal Medicine

## 2017-09-10 DIAGNOSIS — I10 Essential (primary) hypertension: Secondary | ICD-10-CM

## 2017-11-02 ENCOUNTER — Ambulatory Visit: Payer: BLUE CROSS/BLUE SHIELD | Admitting: Internal Medicine

## 2017-11-16 ENCOUNTER — Ambulatory Visit: Payer: BLUE CROSS/BLUE SHIELD | Admitting: Internal Medicine

## 2017-11-23 ENCOUNTER — Ambulatory Visit: Payer: BLUE CROSS/BLUE SHIELD | Admitting: Internal Medicine

## 2017-11-23 VITALS — BP 121/60 | HR 59 | Temp 98.2°F | Wt 135.4 lb

## 2017-11-23 DIAGNOSIS — I10 Essential (primary) hypertension: Secondary | ICD-10-CM

## 2017-11-23 DIAGNOSIS — M549 Dorsalgia, unspecified: Secondary | ICD-10-CM

## 2017-11-23 MED ORDER — CYCLOBENZAPRINE HCL 5 MG PO TABS: 5.0000 mg | ORAL_TABLET | Freq: Every day | ORAL | 1 refills | Status: DC

## 2017-11-23 MED ORDER — TRIAMCINOLONE ACETONIDE 0.1 % EX OINT: 1.0000 "application " | TOPICAL_OINTMENT | Freq: Two times a day (BID) | CUTANEOUS | 0 refills | Status: DC

## 2017-11-23 NOTE — Patient Instructions (Signed)
Sara Day,  I prescribed the muscle relaxant flexeril. Because this may make you sleepy, I would try this first before bed. Do not take more than 3 times a day. Massage therapy would likely be helpful. Heat therapy can help, as well. Take ibuprofen or tylenol as needed for inflammation.   I will call with your lab results. Your blood pressure looks great!  Best, Dr. Ola Spurr

## 2017-11-24 LAB — COMPREHENSIVE METABOLIC PANEL
A/G RATIO: 1.5 (ref 1.2–2.2)
ALT: 10 IU/L (ref 0–32)
AST: 19 IU/L (ref 0–40)
Albumin: 4.2 g/dL (ref 3.5–5.5)
Alkaline Phosphatase: 50 IU/L (ref 39–117)
BUN/Creatinine Ratio: 14 (ref 9–23)
BUN: 11 mg/dL (ref 6–24)
Bilirubin Total: 0.7 mg/dL (ref 0.0–1.2)
CO2: 23 mmol/L (ref 20–29)
Calcium: 8.9 mg/dL (ref 8.7–10.2)
Chloride: 105 mmol/L (ref 96–106)
Creatinine, Ser: 0.81 mg/dL (ref 0.57–1.00)
GFR, EST AFRICAN AMERICAN: 97 mL/min/{1.73_m2} (ref >59)
GFR, EST NON AFRICAN AMERICAN: 84 mL/min/{1.73_m2} (ref >59)
GLOBULIN, TOTAL: 2.8 g/dL (ref 1.5–4.5)
Glucose: 90 mg/dL (ref 65–99)
POTASSIUM: 4.2 mmol/L (ref 3.5–5.2)
SODIUM: 141 mmol/L (ref 134–144)
TOTAL PROTEIN: 7 g/dL (ref 6.0–8.5)

## 2017-11-24 LAB — LIPID PANEL
CHOL/HDL RATIO: 3.6 ratio (ref 0.0–4.4)
Cholesterol, Total: 215 mg/dL — ABNORMAL HIGH (ref 100–199)
HDL: 60 mg/dL (ref >39)
LDL CALC: 138 mg/dL — AB (ref 0–99)
Triglycerides: 85 mg/dL (ref 0–149)
VLDL Cholesterol Cal: 17 mg/dL (ref 5–40)

## 2017-11-28 ENCOUNTER — Encounter: Payer: Self-pay | Admitting: Internal Medicine

## 2017-11-28 NOTE — Assessment & Plan Note (Signed)
-   Exam consistent with muscle strain. No radiation of pain down arms or midline spinal TTP to suggest nerve pain. - Prescribed flexeril. Advised to try before bedtime first in case it makes her sleepy. - Can try heat, massage, tylenol/ibuprofen, light stretching.

## 2017-11-28 NOTE — Assessment & Plan Note (Signed)
-   Well controlled today. Due for lipid panel and CMP discussed at visit last fall; orders placed and will call with results
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
## 2017-12-03 ENCOUNTER — Encounter: Payer: Self-pay | Admitting: Internal Medicine

## 2018-02-15 ENCOUNTER — Other Ambulatory Visit: Payer: Self-pay

## 2018-02-15 MED ORDER — CYCLOBENZAPRINE HCL 5 MG PO TABS: 5.0000 mg | ORAL_TABLET | Freq: Every day | ORAL | 0 refills | Status: DC

## 2018-03-01 ENCOUNTER — Telehealth: Payer: Self-pay | Admitting: Internal Medicine

## 2018-03-01 NOTE — Telephone Encounter (Signed)
Pt called and would like a paper referral stating that she needs massages for her pain. Insurance does not cover this but she pays out of pocket, but [facility] on  [address] need a paper referral for her to be seen at their office. Please let her know when this is ready to pick up.

## 2018-03-02 NOTE — Telephone Encounter (Signed)
Pt informed. Kalayah Leske, CMA  

## 2018-03-02 NOTE — Telephone Encounter (Signed)
Placed note up front. Please inform patient.

## 2018-03-07 ENCOUNTER — Encounter: Payer: Self-pay | Admitting: Internal Medicine

## 2018-03-07 ENCOUNTER — Telehealth: Payer: Self-pay | Admitting: Internal Medicine

## 2018-03-07 NOTE — Telephone Encounter (Signed)
t called because the referral that was written did not include her neck. She said that needs the referral to state back and neck. Please leave up front when done. Her appointment is for 03/12/18. Please with questions and also when done so she can come and pick up.

## 2018-03-07 NOTE — Telephone Encounter (Signed)
Printed letter for patient and placed up front. Please inform her. Thank you.

## 2018-03-07 NOTE — Telephone Encounter (Signed)
Attempted to call pt twice, no answer. Taren Toops Kennon Holter, CMA

## 2018-03-09 NOTE — Telephone Encounter (Signed)
Pt informed and has already picked the letter up. Deseree Kennon Holter, CMA

## 2018-05-22 ENCOUNTER — Other Ambulatory Visit: Payer: Self-pay | Admitting: Internal Medicine

## 2018-05-22 DIAGNOSIS — I10 Essential (primary) hypertension: Secondary | ICD-10-CM

## 2018-05-30 ENCOUNTER — Telehealth: Payer: Self-pay
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
## 2018-06-02 NOTE — Telephone Encounter (Signed)
Thanks! Was also unable to get in touch with patient.  Will address once we have more clarification - unsure why she would need that.

## 2018-06-30 ENCOUNTER — Encounter: Payer: Self-pay | Admitting: Family Medicine

## 2018-06-30 ENCOUNTER — Ambulatory Visit (INDEPENDENT_AMBULATORY_CARE_PROVIDER_SITE_OTHER): Payer: BLUE CROSS/BLUE SHIELD | Admitting: Family Medicine

## 2018-06-30 ENCOUNTER — Other Ambulatory Visit: Payer: Self-pay

## 2018-06-30 VITALS — BP 124/72 | HR 68 | Temp 98.2°F | Ht 61.0 in | Wt 138.2 lb

## 2018-06-30 DIAGNOSIS — K219 Gastro-esophageal reflux disease without esophagitis: Secondary | ICD-10-CM | POA: Diagnosis not present

## 2018-06-30 DIAGNOSIS — Z1211 Encounter for screening for malignant neoplasm of colon: Secondary | ICD-10-CM | POA: Diagnosis not present

## 2018-06-30 MED ORDER — DOCUSATE SODIUM 100 MG PO CAPS: 100.0000 mg | ORAL_CAPSULE | Freq: Two times a day (BID) | ORAL | 0 refills | Status: DC

## 2018-06-30 MED ORDER — OMEPRAZOLE 40 MG PO CPDR: 40.0000 mg | DELAYED_RELEASE_CAPSULE | Freq: Every day | ORAL | 3 refills | Status: DC

## 2018-06-30 NOTE — Progress Notes (Addendum)
    Subjective:  Sara Day is a 52 y.o. female who presents to the [facility] today for a colonoscopy referral.   HPI: Patient interested in colon cancer screening now that she is 52 years old. Denies family hx of colon cancer and says they only health condition she know about in her family is her grandmother had breast cancer when she was "older".  Patient reports her only chronic medical conditions at this time are hypertension and gastric reflux. She says that her hypertension is well controlled. She says that her reflux is well controlled when she is on medications but she is not taking them at this time. Does not know which medication she has taken in the past.   Patient reports episodes of chest "tightness" over the past few months. She says she has had two episodes of this tightness and that she was was working when one occurred and driving when the other occurred. The last time this happened was approximately one month ago. She is unsure if she had just eaten prior to the episodes. The symptoms lasted for 10 minutes, she took her blood pressure medications, and they resolved. Denies true pain, radiation down arm or into back, diaphoresis.   Patient also complaining of constipation with last BM being one week ago. Patient has not tried anything to help resolve constipation. Denies HA, SOB, changes in vision, CP, NVD, changes in urination, hematuria, hematochezia.   Objective:   Physical Exam: BP 124/72   Pulse 68   Temp 98.2 F (36.8 C) (Oral)   Ht 5\' 1"  (1.549 m)   Wt 138 lb 3.2 oz (62.7 kg)   SpO2 98%   BMI 26.11 kg/m   Gen: NAD, resting comfortably CV: RRR with no murmurs appreciated Pulm: NWOB, CTAB with no crackles, wheezes, or rhonchi GI: Normal bowel sounds present. Soft, Nontender, Nondistended. MSK: no edema, cyanosis, or clubbing noted Skin: warm, dry Neuro: grossly normal, moves all extremities Psych: Normal affect and thought content  Assessment/Plan:  Sara Day is a 52 y.o. female who presents to the [facility] today for a colonoscopy referral.  She reports two episodes of chest tightness in the past few months with we believe to be related to gastric reflux. Denies any symptoms at this time. Patient also has concerns about constipation.   Colonoscopy  -- Referral sent to GI   Chest pain- atypical, low suspicion for cardiac etiology, will trial PPI for now, avoid hot/spicy foods, chocolate, caffeine.  Avoid laying down immediately following meals.   -- Patient prescribed Prilosec 40 mg daily  -- Counseled on symptoms and red flags that would indicate she needs to be seen   Constipation  -- last BM one week ago  -- Prescription sent in for Colace  -- Patient counseled on ensuring she drinks plenty of fluids   Hal Hope, [facility] [employer]  07/06/2018 8:16 PM   Patient seen along with MS4 student Gifford Shave. I personally evaluated this patient along with the student, and verified all aspects of the history, physical exam, and medical decision making as documented by the student. I agree with the student's documentation and have made all necessary edits.  Lovenia Kim MD [place] PGY3
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
## 2018-08-16 ENCOUNTER — Encounter: Payer: Self-pay | Admitting: Gastroenterology

## 2018-09-19 ENCOUNTER — Encounter: Payer: Self-pay | Admitting: Gastroenterology

## 2018-09-19 ENCOUNTER — Ambulatory Visit (AMBULATORY_SURGERY_CENTER): Payer: Self-pay | Admitting: *Deleted

## 2018-09-19 VITALS — Ht 61.0 in | Wt 136.0 lb

## 2018-09-19 DIAGNOSIS — Z1211 Encounter for screening for malignant neoplasm of colon: Secondary | ICD-10-CM

## 2018-09-19 MED ORDER — PEG 3350-KCL-NA BICARB-NACL 420 G PO SOLR: 4000.0000 mL | Freq: Once | ORAL | 0 refills | Status: AC

## 2018-09-19 NOTE — Progress Notes (Signed)
Patient denies any allergies to eggs or soy. Patient denies any problems with anesthesia/sedation. Patient denies any oxygen use at home. Patient denies taking any diet/weight loss medications or blood thinners. Colonoscopy hangout given to pt.

## 2018-09-22 ENCOUNTER — Other Ambulatory Visit: Payer: Self-pay

## 2018-09-22 ENCOUNTER — Encounter (HOSPITAL_COMMUNITY): Payer: Self-pay | Admitting: Emergency Medicine

## 2018-09-22 ENCOUNTER — Ambulatory Visit (HOSPITAL_COMMUNITY)
Admission: EM | Admit: 2018-09-22 | Discharge: 2018-09-22 | Disposition: A | Discharge status: Home / Self Care | Payer: BLUE CROSS/BLUE SHIELD | Attending: Family Medicine | Admitting: Family Medicine

## 2018-09-22 DIAGNOSIS — Z3202 Encounter for pregnancy test, result negative: Secondary | ICD-10-CM

## 2018-09-22 DIAGNOSIS — M545 Low back pain, unspecified: Secondary | ICD-10-CM

## 2018-09-22 LAB — POCT URINALYSIS DIP (DEVICE)
Bilirubin Urine: NEGATIVE
Glucose, UA: NEGATIVE mg/dL
Hgb urine dipstick: NEGATIVE
Ketones, ur: NEGATIVE mg/dL
Leukocytes, UA: NEGATIVE
NITRITE: NEGATIVE
PH: 7 (ref 5.0–8.0)
Protein, ur: NEGATIVE mg/dL
Specific Gravity, Urine: 1.025 (ref 1.005–1.030)
UROBILINOGEN UA: 1 mg/dL (ref 0.0–1.0)

## 2018-09-22 LAB — POCT PREGNANCY, URINE: Preg Test, Ur: NEGATIVE

## 2018-09-22 MED ORDER — BUPIVACAINE HCL (PF) 0.5 % IJ SOLN
INTRAMUSCULAR | Status: AC
  Filled 2018-09-22: qty 10

## 2018-09-22 MED ORDER — METHYLPREDNISOLONE ACETATE 80 MG/ML IJ SUSP
INTRAMUSCULAR | Status: AC
  Filled 2018-09-22: qty 1

## 2018-09-22 MED ORDER — METHYLPREDNISOLONE ACETATE 40 MG/ML IJ SUSP
INTRAMUSCULAR | Status: AC
  Filled 2018-09-22: qty 1
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
## 2018-09-22 NOTE — Discharge Instructions (Signed)
Please try heat on the area  Please try the exercises  Please try working on your posture at work  Please follow up with your regular doctor if your symptoms don't improve.

## 2018-09-22 NOTE — ED Provider Notes (Signed)
[place]    CSN: [id number] Arrival date & time: 09/22/18  1905     History   Chief Complaint Chief Complaint  Patient presents with  . Back Pain    HPI Sara Day is a 52 y.o. female.   She is presenting with acute on chronic left lower back pain.  This pain is nonradiating.  Intermittent in nature.  Worse with bending over and picking up things.  She has tried anti-inflammatories and muscle relaxers in the past.  Denies any inciting event.  The pain is sharp and stabbing in nature.  She denies any urinary complaints.  Pain is moderate to severe.  HPI  Past Medical History:  Diagnosis Date  . Asthma   . Breast mass   . Hypertension     Patient Active Problem List   Diagnosis Date Noted  . Pap smear for cervical cancer screening 07/08/2017  . Conjunctivitis 07/05/2017  . Flank pain 06/10/2016  . Vaginal discharge 07/05/2014  . CIN I (cervical intraepithelial neoplasia I) 05/07/2014  . Abnormality of cervix 04/27/2014  . Asthma 04/27/2014  . Exposure to communicable disease 08/08/2012  . Contraception management 04/03/2011  . Upper back pain 01/14/2011  . HYPERCHOLESTEROLEMIA 05/21/2009  . Essential hypertension 05/21/2009  . GERD 05/07/2008  . DEPRESSIVE DISORDER, NOS 12/16/2006    Past Surgical History:  Procedure Laterality Date  . BREAST EXCISIONAL BIOPSY      OB History   None      Home Medications    Prior to Admission medications   Medication Sig Start Date End Date Taking? Authorizing Provider  cyclobenzaprine (FLEXERIL) 5 MG tablet Take 1 tablet (5 mg total) by mouth at bedtime. 02/15/18  Yes Rogue Bussing, MD  hydrochlorothiazide (HYDRODIURIL) 12.5 MG tablet TAKE 1 TABLET BY MOUTH DAILY 05/23/18  Yes Lovenia Kim, MD  albuterol (PROVENTIL HFA) 108 (90 Base) MCG/ACT inhaler Inhale 2 puffs into the lungs every 4 (four) hours as needed for wheezing. Patient not taking: Reported on 09/19/2018 07/14/17   Rogue Bussing, MD  triamcinolone ointment (KENALOG) 0.1 % Apply 1 application topically 2 (two) times daily. Patient not taking: Reported on 09/19/2018 11/23/17   Rogue Bussing, MD    Family History Family History  Problem Relation Age of Onset  . Heart attack Father   . Colon cancer Neg Hx   . Colon polyps Neg Hx   . Esophageal cancer Neg Hx   . Rectal cancer Neg Hx   . Stomach cancer Neg Hx     Social History Social History   Tobacco Use  . Smoking status: Never Smoker  . Smokeless tobacco: Never Used  Substance Use Topics  . Alcohol use: Yes    Comment: wine occ.once every 2-3 weeks per pt  . Drug use: No     Allergies   Clindamycin/lincomycin; Codeine; Metronidazole; Sulfa antibiotics; and Sulfamethoxazole   Review of Systems Review of Systems  Constitutional: Negative for fever.  HENT: Negative for congestion.   Respiratory: Negative for cough.   Cardiovascular: Negative for chest pain.  Gastrointestinal: Negative for abdominal pain.  Musculoskeletal: Positive for back pain.  Skin: Negative for color change.  Neurological: Negative for weakness.  Hematological: Negative for adenopathy.  Psychiatric/Behavioral: Negative for agitation.     Physical Exam Triage Vital Signs ED Triage Vitals  Enc Vitals Group     BP 09/22/18 1953 (!) 173/72     Pulse Rate 09/22/18 1953 61  Resp 09/22/18 1953 18     Temp 09/22/18 1953 97.8 F (36.6 C)     Temp Source 09/22/18 1953 Oral     SpO2 09/22/18 1953 100 %     Weight --      Height --      Head Circumference --      Peak Flow --      Pain Score 09/22/18 1949 6     Pain Loc --      Pain Edu? --      Excl. in [place]? --    No data found.  Updated Vital Signs BP (!) 173/72 (BP Location: Right Arm) Comment: has not had medicine today  Pulse 61   Temp 97.8 F (36.6 C) (Oral)   Resp 18   LMP 07/23/2018   SpO2 100%   Visual Acuity Right Eye Distance:   Left Eye Distance:   Bilateral Distance:     Right Eye Near:   Left Eye Near:    Bilateral Near:     Physical Exam Gen: NAD, alert, cooperative with exam, well-appearing ENT: normal lips, normal nasal mucosa,  Eye: normal EOM, normal conjunctiva and lids CV:  no edema, +2 pedal pulses   Resp: no accessory muscle use, non-labored,  Skin: no rashes, no areas of induration  Neuro: normal tone, normal sensation to touch Psych:  normal insight, alert and oriented MSK:  Back Exam:  Inspection: Unremarkable  Palpable tenderness: Tender to palpation over the left lower lumbar muscles.  Denies any tenderness to palpation over the midline lumbar spine. Range of Motion:  Flexion 45 deg; Extension 45 deg; Side Bending to 45 deg bilaterally; Rotation to 45 deg bilaterally  Leg strength: Quad: 5/5 Hamstring: 5/5 Hip flexor:  Strength at foot: Plantar-flexion: 5/5 Dorsi-flexion: 5/5 Eversion: 5/5 Inversion: 5/5  Reflexes: 2+ at both patellar tendons,   Gait unremarkable. SLR laying: Negative  Neurovascular intact   Aspiration/Injection Procedure Note Sara Day 1965-10-28  Procedure: Injection Indications: Low back pain  Procedure Details Consent: Risks of procedure as well as the alternatives and risks of each were explained to the (patient/caregiver).  Consent for procedure obtained. Time Out: Verified patient identification, verified procedure, site/side was marked, verified correct patient position, special equipment/implants available, medications/allergies/relevent history reviewed, required imaging and test results available.  Performed.  The area was cleaned with iodine and alcohol swabs.    The left lower lumbar paraspinal muscle trigger points was injected using 1 cc's of 40 mg Depomedrol and 4 cc's of 1% lidocaine with a 25 1 1/2" needle.  A sterile dressing was applied.  Patient did tolerate procedure well.    UC Treatments / Results  Labs (all labs ordered are listed, but only abnormal results are  displayed) Labs Reviewed  POCT URINALYSIS DIP (DEVICE)  POCT PREGNANCY, URINE    EKG None  Radiology No results found.  Procedures Procedures (including critical care time)  Medications Ordered in UC Medications - No data to display  Initial Impression / Assessment and Plan / UC Course  I have reviewed the triage vital signs and the nursing notes.  Pertinent labs & imaging results that were available during my care of the patient were reviewed by me and considered in my medical decision making (see chart for details).     Shonna Chock is a 52 year old female is presenting with acute on chronic low back pain without sciatica.  Likely muscular in origin.  Less likely for nephrolithiasis.  Treated with trigger point  injections today.  Counseled on home exercise therapy and supportive care.  If no improvement could consider imaging and referral to physical therapy.  Final Clinical Impressions(s) / UC Diagnoses   Final diagnoses:  Acute left-sided low back pain without sciatica     Discharge Instructions     Please try heat on the area  Please try the exercises  Please try working on your posture at work  Please follow up with your regular doctor if your symptoms don't improve.     ED Prescriptions    None     Controlled Substance Prescriptions [place] Controlled Substance Registry consulted? Not Applicable   Rosemarie Ax, MD 09/22/18 2103

## 2018-09-24 ENCOUNTER — Ambulatory Visit (HOSPITAL_COMMUNITY)
Admission: EM | Admit: 2018-09-24 | Discharge: 2018-09-24 | Disposition: A | Discharge status: Home / Self Care | Payer: BLUE CROSS/BLUE SHIELD | Attending: Radiology | Admitting: Radiology

## 2018-09-24 ENCOUNTER — Other Ambulatory Visit: Payer: Self-pay

## 2018-09-24 DIAGNOSIS — R110 Nausea: Secondary | ICD-10-CM | POA: Diagnosis not present

## 2018-09-24 DIAGNOSIS — I10 Essential (primary) hypertension: Secondary | ICD-10-CM

## 2018-09-24 LAB — POCT PREGNANCY, URINE: Preg Test, Ur: NEGATIVE

## 2018-09-24 MED ORDER — ONDANSETRON HCL 4 MG PO TABS: 4.0000 mg | ORAL_TABLET | Freq: Four times a day (QID) | ORAL | 0 refills | Status: DC

## 2018-09-24 MED ORDER — HYDROCHLOROTHIAZIDE 12.5 MG PO TABS: 12.5000 mg | ORAL_TABLET | Freq: Every day | ORAL | 0 refills | Status: DC

## 2018-09-24 NOTE — ED Provider Notes (Signed)
[place]    CSN: [id number] Arrival date & time: 09/24/18  1224     History   Chief Complaint Chief Complaint  Patient presents with  . missed periods    HPI Sara Day is a 52 y.o. female.   52 year old female presents for concerns of pregnancy.  Patient states that she is better.  In 2 months.  Patient states that she is having sexual intercourse with no protection.  Patient reports nausea denies any abdominal pain bloating distention vomiting.  Patient was seen 2 days prior for her back pain with a negative pregnancy test at that time.     Past Medical History:  Diagnosis Date  . Asthma   . Breast mass   . Hypertension     Patient Active Problem List   Diagnosis Date Noted  . Pap smear for cervical cancer screening 07/08/2017  . Conjunctivitis 07/05/2017  . Flank pain 06/10/2016  . Vaginal discharge 07/05/2014  . CIN I (cervical intraepithelial neoplasia I) 05/07/2014  . Abnormality of cervix 04/27/2014  . Asthma 04/27/2014  . Exposure to communicable disease 08/08/2012  . Contraception management 04/03/2011  . Upper back pain 01/14/2011  . HYPERCHOLESTEROLEMIA 05/21/2009  . Essential hypertension 05/21/2009  . GERD 05/07/2008  . DEPRESSIVE DISORDER, NOS 12/16/2006    Past Surgical History:  Procedure Laterality Date  . BREAST EXCISIONAL BIOPSY      OB History   None      Home Medications    Prior to Admission medications   Medication Sig Start Date End Date Taking? Authorizing Provider  albuterol (PROVENTIL HFA) 108 (90 Base) MCG/ACT inhaler Inhale 2 puffs into the lungs every 4 (four) hours as needed for wheezing. Patient not taking: Reported on 09/19/2018 07/14/17   Rogue Bussing, MD  cyclobenzaprine (FLEXERIL) 5 MG tablet Take 1 tablet (5 mg total) by mouth at bedtime. 02/15/18   Rogue Bussing, MD  hydrochlorothiazide (HYDRODIURIL) 12.5 MG tablet TAKE 1 TABLET BY MOUTH DAILY 05/23/18   Lovenia Kim, MD    triamcinolone ointment (KENALOG) 0.1 % Apply 1 application topically 2 (two) times daily. Patient not taking: Reported on 09/19/2018 11/23/17   Rogue Bussing, MD    Family History Family History  Problem Relation Age of Onset  . Heart attack Father   . Colon cancer Neg Hx   . Colon polyps Neg Hx   . Esophageal cancer Neg Hx   . Rectal cancer Neg Hx   . Stomach cancer Neg Hx     Social History Social History   Tobacco Use  . Smoking status: Never Smoker  . Smokeless tobacco: Never Used  Substance Use Topics  . Alcohol use: Yes    Comment: wine occ.once every 2-3 weeks per pt  . Drug use: No     Allergies   Clindamycin/lincomycin; Codeine; Metronidazole; Sulfa antibiotics; and Sulfamethoxazole   Review of Systems Review of Systems   Physical Exam Triage Vital Signs ED Triage Vitals  Enc Vitals Group     BP 09/24/18 1331 (!) 154/83     Pulse Rate 09/24/18 1331 68     Resp 09/24/18 1331 18     Temp 09/24/18 1331 98.2 F (36.8 C)     Temp Source 09/24/18 1331 Oral     SpO2 --      Weight 09/24/18 1333 136 lb (61.7 kg)     Height --      Head Circumference --      Peak  Flow --      Pain Score --      Pain Loc --      Pain Edu? --      Excl. in [place]? --    No data found.  Updated Vital Signs BP (!) 154/83 (BP Location: Right Arm)   Pulse 68   Temp 98.2 F (36.8 C) (Oral)   Resp 18   Wt 136 lb (61.7 kg)   BMI 25.70 kg/m   Visual Acuity Right Eye Distance:   Left Eye Distance:   Bilateral Distance:    Right Eye Near:   Left Eye Near:    Bilateral Near:     Physical Exam   UC Treatments / Results  Labs (all labs ordered are listed, but only abnormal results are displayed) Labs Reviewed  POC URINE PREG, ED    EKG None  Radiology No results found.  Procedures Procedures (including critical care time)  Medications Ordered in UC Medications - No data to display  Initial Impression / Assessment and Plan / UC Course  I have  reviewed the triage vital signs and the nursing notes.  Pertinent labs & imaging results that were available during my care of the patient were reviewed by me and considered in my medical decision making (see chart for details).      Final Clinical Impressions(s) / UC Diagnoses   Final diagnoses:  None   Discharge Instructions   None    ED Prescriptions    None     Controlled Substance Prescriptions [place] Controlled Substance Registry consulted? Not Applicable   Jacqualine Mau, NP 09/24/18 1453

## 2018-09-24 NOTE — ED Triage Notes (Signed)
Pt cc she has missed period for 2 months now.

## 2018-09-30 ENCOUNTER — Ambulatory Visit (AMBULATORY_SURGERY_CENTER): Payer: BLUE CROSS/BLUE SHIELD | Admitting: Gastroenterology

## 2018-09-30 ENCOUNTER — Encounter: Payer: Self-pay | Admitting: Gastroenterology

## 2018-09-30 VITALS — BP 123/97 | HR 66 | Temp 98.2°F | Resp 22 | Ht 61.0 in | Wt 136.0 lb

## 2018-09-30 DIAGNOSIS — Z1211 Encounter for screening for malignant neoplasm of colon: Secondary | ICD-10-CM | POA: Diagnosis not present

## 2018-09-30 DIAGNOSIS — D127 Benign neoplasm of rectosigmoid junction: Secondary | ICD-10-CM | POA: Diagnosis not present

## 2018-09-30 DIAGNOSIS — D122 Benign neoplasm of ascending colon: Secondary | ICD-10-CM | POA: Diagnosis not present

## 2018-09-30 MED ORDER — SODIUM CHLORIDE 0.9 % IV SOLN: 500.0000 mL | Freq: Once | INTRAVENOUS | Status: DC

## 2018-09-30 NOTE — Progress Notes (Signed)
PT taken to PACU. Monitors in place. VSS. Report given to RN. 

## 2018-09-30 NOTE — Op Note (Signed)
[place] Patient Name: Sara Day Procedure Date: 09/30/2018 11:14 AM MRN: [id number] Endoscopist: Justice Britain , MD Age: 52 Referring MD:  Date of Birth: April 09, 1966 Gender: Female Account #: [id number] Procedure:                Colonoscopy Indications:              Screening for colorectal malignant neoplasm Medicines:                Monitored Anesthesia Care Procedure:                Pre-Anesthesia Assessment:                           - Prior to the procedure, a History and Physical                            was performed, and patient medications and                            allergies were reviewed. The patient's tolerance of                            previous anesthesia was also reviewed. The risks                            and benefits of the procedure and the sedation                            options and risks were discussed with the patient.                            All questions were answered, and informed consent                            was obtained. Prior Anticoagulants: The patient has                            taken no previous anticoagulant or antiplatelet                            agents. ASA Grade Assessment: I - A normal, healthy                            patient. After reviewing the risks and benefits,                            the patient was deemed in satisfactory condition to                            undergo the procedure.                           After obtaining informed consent, the colonoscope  was passed under direct vision. Throughout the                            procedure, the patient's blood pressure, pulse, and                            oxygen saturations were monitored continuously. The                            Model PCF-H190DL [id number]) scope was introduced                            through the anus and advanced to the 5 cm into the                            ileum. The  colonoscopy was performed without                            difficulty. The patient tolerated the procedure.                            The quality of the bowel preparation was evaluated                            using the BBPS [facility] Bowel Preparation Scale)                            with scores of: Right Colon = 3, Transverse Colon =                            3 and Left Colon = 3 (entire mucosa seen well with                            no residual staining, small fragments of stool or                            opaque liquid). The total BBPS score equals 9. Scope In: 11:26:48 AM Scope Out: 11:39:29 AM Scope Withdrawal Time: 0 hours 9 minutes 18 seconds  Total Procedure Duration: 0 hours 12 minutes 41 seconds  Findings:                 The digital rectal exam was normal. Pertinent                            negatives include no palpable rectal lesions.                           The terminal ileum and ileocecal valve appeared                            normal.                           A 3  mm polyp was found in the ascending colon. The                            polyp was sessile. The polyp was removed with a                            cold snare. Resection and retrieval were complete.                           A 1 mm polyp was found in the recto-sigmoid colon.                            The polyp was sessile. The polyp was removed with a                            cold biopsy forceps. Resection and retrieval were                            complete.                           Normal mucosa was found in the entire colon.                           Non-bleeding non-thrombosed internal hemorrhoids                            were found during retroflexion. The hemorrhoids                            were Grade I (internal hemorrhoids that do not                            prolapse). Complications:            No immediate complications. Estimated Blood Loss:     Estimated blood loss was  minimal. Impression:               - The examined portion of the ileum was normal.                           - One 3 mm polyp in the ascending colon, removed                            with a cold snare. Resected and retrieved.                           - One 1 mm polyp at the recto-sigmoid colon,                            removed with a cold biopsy forceps. Resected and                            retrieved.                           -  Normal mucosa in the entire examined colon.                           - Non-bleeding non-thrombosed internal hemorrhoids. Recommendation:           - The patient will be observed post-procedure,                            until all discharge criteria are met.                           - Discharge patient to home.                           - Patient has a contact number available for                            emergencies. The signs and symptoms of potential                            delayed complications were discussed with the                            patient. Return to normal activities tomorrow.                            Written discharge instructions were provided to the                            patient.                           - High fiber diet.                           - Use fiber, for example Citrucel, Fibercon, Konsyl                            or Metamucil.                           - Continue present medications.                           - Await pathology results.                           - Repeat colonoscopy in 5-10 years for surveillance                            based on pathology results and findings of                            adenomatous tissue.                           - The findings and recommendations were discussed  with the patient.                           - The findings and recommendations were discussed                            with the designated responsible adult. Justice Britain,  MD 09/30/2018 11:44:39 AM

## 2018-09-30 NOTE — Progress Notes (Signed)
Called to room to assist during endoscopic procedure.  Patient ID and intended procedure confirmed with present staff. Received instructions for my participation in the procedure from the performing physician.  

## 2018-09-30 NOTE — Patient Instructions (Signed)
Thank you for allowing [place] to care for you today!  Await lab results for final recommendation for another colonoscopy in 5 or 10 years.  Follow a high fiber diet.  Handout given.  Use fiber for example, Citrucel, Fibercon, Konsyl, or Metamucil.     YOU HAD AN ENDOSCOPIC PROCEDURE TODAY AT [facility]:   Refer to the procedure report that was given to you for any specific questions about what was found during the examination.  If the procedure report does not answer your questions, please call your gastroenterologist to clarify.  If you requested that your care partner not be given the details of your procedure findings, then the procedure report has been included in a sealed envelope for you to review at your convenience later.  YOU SHOULD EXPECT: Some feelings of bloating in the abdomen. Passage of more gas than usual.  Walking can help get rid of the air that was put into your GI tract during the procedure and reduce the bloating. If you had a lower endoscopy (such as a colonoscopy or flexible sigmoidoscopy) you may notice spotting of blood in your stool or on the toilet paper. If you underwent a bowel prep for your procedure, you may not have a normal bowel movement for a few days.  Please Note:  You might notice some irritation and congestion in your nose or some drainage.  This is from the oxygen used during your procedure.  There is no need for concern and it should clear up in a day or so.  SYMPTOMS TO REPORT IMMEDIATELY:   Following lower endoscopy (colonoscopy or flexible sigmoidoscopy):  Excessive amounts of blood in the stool  Significant tenderness or worsening of abdominal pains  Swelling of the abdomen that is new, acute  Fever of 100F or higher   For urgent or emergent issues, a gastroenterologist can be reached at any hour by calling [phone].   DIET:  We do recommend a small meal at first, but then you may proceed to your regular diet.  Drink  plenty of fluids but you should avoid alcoholic beverages for 24 hours.  ACTIVITY:  You should plan to take it easy for the rest of today and you should NOT DRIVE or use heavy machinery until tomorrow (because of the sedation medicines used during the test).    FOLLOW UP: Our staff will call the number listed on your records the next business day following your procedure to check on you and address any questions or concerns that you may have regarding the information given to you following your procedure. If we do not reach you, we will leave a message.  However, if you are feeling well and you are not experiencing any problems, there is no need to return our call.  We will assume that you have returned to your regular daily activities without incident.  If any biopsies were taken you will be contacted by phone or by letter within the next 1-3 weeks.  Please call us at [phone] if you have not heard about the biopsies in 3 weeks.    SIGNATURES/CONFIDENTIALITY: You and/or your care partner have signed paperwork which will be entered into your electronic medical record.  These signatures attest to the fact that that the information above on your After Visit Summary has been reviewed and is understood.  Full responsibility of the confidentiality of this discharge information lies with you and/or your care-partner.

## 2018-10-03 ENCOUNTER — Telehealth: Payer: Self-pay | Admitting: *Deleted

## 2018-10-03 ENCOUNTER — Telehealth: Payer: Self-pay

## 2018-10-03 NOTE — Telephone Encounter (Signed)
Attempted to reach pt. For follow-up following endoscopic procedure 09/30/2018.  LM on pt. Voice mail.  Will try to reach pt. Again later today.

## 2018-10-03 NOTE — Telephone Encounter (Signed)
  Follow up Call-  Call back number 09/30/2018  Post procedure Call Back phone  # [phone]  Permission to leave phone message Yes  Some recent data might be hidden     Patient questions:  Do you have a fever, pain , or abdominal swelling? No. Pain Score  0 *  Have you tolerated food without any problems? Yes.    Have you been able to return to your normal activities? Yes.    Do you have any questions about your discharge instructions: Diet   No. Medications  No. Follow up visit  No.  Do you have questions or concerns about your Care? No.  Actions: * If pain score is 4 or above: No action needed, pain <4.

## 2018-10-06 ENCOUNTER — Encounter: Payer: Self-pay | Admitting: Gastroenterology

## 2018-11-04 ENCOUNTER — Other Ambulatory Visit: Payer: Self-pay

## 2018-11-04 ENCOUNTER — Ambulatory Visit (INDEPENDENT_AMBULATORY_CARE_PROVIDER_SITE_OTHER): Payer: BLUE CROSS/BLUE SHIELD | Admitting: Family Medicine

## 2018-11-04 ENCOUNTER — Encounter: Payer: Self-pay | Admitting: Family Medicine

## 2018-11-04 VITALS — BP 140/80 | HR 53 | Temp 97.8°F | Wt 135.0 lb

## 2018-11-04 DIAGNOSIS — Z23 Encounter for immunization: Secondary | ICD-10-CM

## 2018-11-04 DIAGNOSIS — M545 Low back pain, unspecified: Secondary | ICD-10-CM

## 2018-11-04 MED ORDER — MELOXICAM 7.5 MG PO TABS: 7.5000 mg | ORAL_TABLET | Freq: Every day | ORAL | 0 refills | Status: DC

## 2018-11-04 NOTE — Patient Instructions (Signed)
It was nice meeting you today Ms. Tellefsen!  For your back pain, we will get x-rays of your lumbar spine.  Please go to [facility] imaging to get this done.  I am also sending a referral to physical therapy.  They will contact you in a few weeks to set up your appointment.  Please also try meloxicam once daily for days that your back is hurting more, which you can pick up at your pharmacy.  I hope you feel better soon.  If you have any questions or concerns, please feel free to call the clinic.   Be well,  Dr. Shan Levans
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
## 2018-11-04 NOTE — Assessment & Plan Note (Signed)
Appears to be musculoskeletal, similar to the findings at urgent care in December 2019.  Will obtain lumbar x-rays today and refer to physical therapy.  We will also prescribe Mobic 7.5 mg once per day for days that her pain is more significant.  May consider MRI if x-ray is noncontributory.

## 2018-11-23 ENCOUNTER — Ambulatory Visit: Payer: BLUE CROSS/BLUE SHIELD | Admitting: Physical Therapy

## 2018-12-06 ENCOUNTER — Ambulatory Visit: Payer: BLUE CROSS/BLUE SHIELD | Admitting: Physical Therapy

## 2018-12-06 ENCOUNTER — Other Ambulatory Visit: Payer: Self-pay

## 2018-12-06 ENCOUNTER — Ambulatory Visit: Payer: BLUE CROSS/BLUE SHIELD | Attending: Family Medicine | Admitting: Physical Therapy

## 2018-12-06 DIAGNOSIS — R252 Cramp and spasm: Secondary | ICD-10-CM

## 2018-12-06 DIAGNOSIS — R293 Abnormal posture: Secondary | ICD-10-CM | POA: Diagnosis not present

## 2018-12-06 DIAGNOSIS — M545 Low back pain, unspecified: Secondary | ICD-10-CM

## 2018-12-06 DIAGNOSIS — M6281 Muscle weakness (generalized): Secondary | ICD-10-CM | POA: Diagnosis not present

## 2018-12-06 NOTE — Patient Instructions (Signed)
Trigger Point Dry Needling  . What is Trigger Point Dry Needling (DN)? o DN is a physical therapy technique used to treat muscle pain and dysfunction. Specifically, DN helps deactivate muscle trigger points (muscle knots).  o A thin filiform needle is used to penetrate the skin and stimulate the underlying trigger point. The goal is for a local twitch response (LTR) to occur and for the trigger point to relax. No medication of any kind is injected during the procedure.   . What Does Trigger Point Dry Needling Feel Like?  o The procedure feels different for each individual patient. Some patients report that they do not actually feel the needle enter the skin and overall the process is not painful. Very mild bleeding may occur. However, many patients feel a deep cramping in the muscle in which the needle was inserted. This is the local twitch response.   Marland Kitchen How Will I feel after the treatment? o Soreness is normal, and the onset of soreness may not occur for a few hours. Typically this soreness does not last longer than two days.  o Bruising is uncommon, however; ice can be used to decrease any possible bruising.  o In rare cases feeling tired or nauseous after the treatment is normal. In addition, your symptoms may get worse before they get better, this period will typically not last longer than 24 hours.   . What Can I do After My Treatment? o Increase your hydration by drinking more water for the next 24 hours. o You may place ice or heat on the areas treated that have become sore, however, do not use heat on inflamed or bruised areas. Heat often brings more relief post needling. o You can continue your regular activities, but vigorous activity is not recommended initially after the treatment for 24 hours. o DN is best combined with other physical therapy such as strengthening, stretching, and other therapies.   TENS UNIT  This is helpful for muscle pain and spasm.   Search and Purchase a  TENS 7000 2nd edition at www.tenspros.com or www.amazon.com  (It should be less than $30)     TENS unit instructions:   Do not shower or bathe with the unit on  Turn the unit off before removing electrodes or batteries  If the electrodes lose stickiness add a drop of water to the electrodes after they are disconnected from the unit and place on plastic sheet. If you continued to have difficulty, call the TENS unit company to purchase more electrodes.  Do not apply lotion on the skin area prior to use. Make sure the skin is clean and dry as this will help prolong the life of the electrodes.  After use, always check skin for unusual red areas, rash or other skin difficulties. If there are any skin problems, does not apply electrodes to the same area.  Never remove the electrodes from the unit by pulling the wires.  Do not use the TENS unit or electrodes other than as directed.  Do not change electrode placement without consulting your therapist or physician.  Keep 2 fingers with between each electrode. Voncille Lo, PT Certified Exercise Expert for the Aging Adult  12/06/18 4:32 PM Phone: [phone] Fax: [phone]
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
## 2018-12-13 ENCOUNTER — Ambulatory Visit: Payer: BLUE CROSS/BLUE SHIELD | Admitting: Physical Therapy

## 2018-12-20 ENCOUNTER — Ambulatory Visit: Payer: BLUE CROSS/BLUE SHIELD | Admitting: Physical Therapy

## 2018-12-27 ENCOUNTER — Ambulatory Visit: Payer: BLUE CROSS/BLUE SHIELD | Attending: Family Medicine | Admitting: Physical Therapy

## 2018-12-27 ENCOUNTER — Encounter: Payer: Self-pay | Admitting: Physical Therapy

## 2018-12-27 DIAGNOSIS — R293 Abnormal posture: Secondary | ICD-10-CM | POA: Diagnosis not present

## 2018-12-27 DIAGNOSIS — R252 Cramp and spasm: Secondary | ICD-10-CM

## 2018-12-27 DIAGNOSIS — M6281 Muscle weakness (generalized): Secondary | ICD-10-CM

## 2018-12-27 DIAGNOSIS — M545 Low back pain, unspecified: Secondary | ICD-10-CM

## 2018-12-27 NOTE — Patient Instructions (Signed)
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
## 2019-01-02 ENCOUNTER — Ambulatory Visit: Payer: BLUE CROSS/BLUE SHIELD | Admitting: Physical Therapy

## 2019-01-03 ENCOUNTER — Ambulatory Visit: Payer: BLUE CROSS/BLUE SHIELD | Admitting: Physical Therapy

## 2019-01-03 ENCOUNTER — Other Ambulatory Visit: Payer: Self-pay

## 2019-01-03 DIAGNOSIS — R293 Abnormal posture: Secondary | ICD-10-CM

## 2019-01-03 DIAGNOSIS — R252 Cramp and spasm: Secondary | ICD-10-CM | POA: Diagnosis not present

## 2019-01-03 DIAGNOSIS — M6281 Muscle weakness (generalized): Secondary | ICD-10-CM

## 2019-01-03 DIAGNOSIS — M545 Low back pain, unspecified: Secondary | ICD-10-CM

## 2019-01-03 NOTE — Therapy (Signed)
[place], [place], [address] Phone: [phone]   Fax:  [phone]  Physical Therapy Treatment  Patient Details  Name: Sara Day MRN: [id number] Date of Birth: November 28, 1965 Referring Provider (PT): Kathrene Alu   Encounter Date: 01/03/2019  PT End of Session - 01/03/19 1728    Visit Number  3    Number of Visits  13    Date for PT Re-Evaluation  01/17/19    Authorization Type  [employer]    PT Start Time  1640    PT Stop Time  1729    PT Time Calculation (min)  49 min    Equipment Utilized During Treatment  Other (comment)   SI Joint Belt   Activity Tolerance  Patient limited by pain;Other (comment)    Behavior During Therapy  Anxious       Past Medical History:  Diagnosis Date  . Asthma   . Breast mass   . Hypertension     Past Surgical History:  Procedure Laterality Date  . BREAST EXCISIONAL BIOPSY      There were no vitals filed for this visit.  Subjective Assessment - 01/03/19 1644    Subjective  I feel that the pain is getting worse.     Limitations  Sitting;Walking    Currently in Pain?  Yes    Pain Score  5     Pain Location  Flank    Pain Orientation  Left;Posterior;Lateral    Pain Onset  More than a month ago                       [facility] Adult PT Treatment/Exercise - 01/03/19 0001      Self-Care   Self-Care  Posture    Other Self-Care Comments   SI Support Belt Trialed in standing, progressed to sitting and supine.       Exercises   Exercises  --      Knee/Hip Exercises: Supine   Heel Slides  AROM;Both;2 sets;10 reps    Hip Adduction Isometric  Strengthening;Both;1 set;5 reps      Acupuncturist Location  L QL    Electrical Stimulation Action  IFC    Electrical Stimulation Parameters  19 ma; 80-150 hz, 10 min scanning 100%    Electrical Stimulation Goals  Pain      Manual Therapy   Manual Therapy  Joint  mobilization    Joint Mobilization  Grade 2 Innominate Compression and Rotation SI joint sidelying right             PT Education - 01/03/19 1719    Education Details  educated on SI joint mechanics and the use of compressive belts to progressively allow for stregnth training and tolerance to trasnfers     Person(s) Educated  Patient    Methods  Explanation;Tactile cues;Verbal cues    Comprehension  Verbalized understanding       PT Short Term Goals - 12/06/18 1657      PT SHORT TERM GOAL #1   Title  "Independent with initial HEP    Time  2    Period  Weeks    Status  New    Target Date  12/20/18      PT SHORT TERM GOAL #2   Title  Pt will be given information concerning purchase of TENS unit if E-stim effective initially    Baseline  tried IFC estim  with good result    Time  2    Period  Weeks    Status  New    Target Date  12/20/18      PT SHORT TERM GOAL #3   Title  Pt will be able to sit with good posture and no compensatory leaning     Baseline  Pt must lean to right to unweight left side /pain    Time  2    Period  Weeks    Status  New    Target Date  12/20/18        PT Long Term Goals - 12/06/18 1544      PT LONG TERM GOAL #1   Title  "Demonstrate and verbalize techniques to reduce the risk of re-injury including: lifting, posture, body mechanics.     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    Time  6    Period  Weeks    Status  New    Target Date  01/17/19      PT LONG TERM GOAL #4   Title  "Pt will tolerate sitting 1 hour without increased pain to ride in car without increased  pain    Time  6    Period  Weeks    Status  New    Target Date  01/17/19      PT LONG TERM GOAL #5   Title  Pt will be able to walk and stand in order to go shopping with dtr and grandchildren for at least 90 minutes or more    Baseline  unable to stand/walk for 10 minutes    Time  6    Period  Weeks    Status  New    Target Date  01/17/19      PT LONG TERM GOAL #6   Title  "FOTO will improve from 73%limitations   to   45%  indicating improved functional mobility.     Time  6    Period  Weeks    Status  New    Target Date  01/17/19            Plan - 01/03/19 1730    Clinical Impression Statement  The patient shows improved tolerance to postural changes and trasnfers with compression and support of SI joint both manually and with belt assist. She was able to demonstrate incresaed volition with isometric adduction while wearing SI belt. while wearing belt also presented with decresaed pain in all positions.     PT Treatment/Interventions  ADLs/Self Care Home Management;Cryotherapy;Electrical Stimulation;Iontophoresis 4mg /ml Dexamethasone;Moist Heat;Ultrasound;Traction;Therapeutic exercise;Therapeutic activities;Stair training;Gait training;Neuromuscular re-education;Patient/family education;Manual techniques;Passive range of motion;Dry needling;Taping;Spinal Manipulations    PT Next Visit Plan  Review use of SI belt if patient acquires her own. Progress with strength and mobiity training with use of belt to relieve pain. Assess SI joint provocation as  indicated as well as lumbar spine mechanics.     Consulted and Agree with Plan of Care  Patient       Patient will benefit from skilled therapeutic intervention in order to improve the following deficits and impairments:  Pain, Improper body mechanics, Increased fascial restricitons, Increased muscle spasms, Impaired flexibility, Difficulty walking, Decreased strength, Decreased mobility, Decreased range of motion, Decreased activity  tolerance  Visit Diagnosis: Acute right-sided low back pain, unspecified whether sciatica present  Muscle weakness (generalized)  Abnormal posture  Cramp and spasm     Problem List Patient Active Problem List   Diagnosis Date Noted  . Acute left-sided low back pain without sciatica 11/04/2018  . Pap smear for cervical cancer screening 07/08/2017  . Conjunctivitis 07/05/2017  . Flank pain 06/10/2016  . Vaginal discharge 07/05/2014  . CIN I (cervical intraepithelial neoplasia I) 05/07/2014  . Abnormality of cervix 04/27/2014  . Asthma 04/27/2014  . Exposure to communicable disease 08/08/2012  . Contraception management 04/03/2011  . Upper back pain 01/14/2011  . HYPERCHOLESTEROLEMIA 05/21/2009  . Essential hypertension 05/21/2009  . GERD 05/07/2008  . DEPRESSIVE DISORDER, NOS 12/16/2006    Starr Lake PT, DPT, LAT, ATC  01/03/19  5:40 PM  [facility] [address] [place], [place], [address] Phone: [phone]   Fax:  [phone]  Name: CHARNETTE YOUNKIN MRN: [id number] Date of Birth: 1965-12-06

## 2019-01-05 ENCOUNTER — Ambulatory Visit: Payer: BLUE CROSS/BLUE SHIELD | Admitting: Physical Therapy

## 2019-01-05 ENCOUNTER — Other Ambulatory Visit: Payer: Self-pay

## 2019-01-05 DIAGNOSIS — R293 Abnormal posture: Secondary | ICD-10-CM

## 2019-01-05 DIAGNOSIS — M545 Low back pain, unspecified: Secondary | ICD-10-CM

## 2019-01-05 DIAGNOSIS — M6281 Muscle weakness (generalized): Secondary | ICD-10-CM | POA: Diagnosis not present

## 2019-01-05 DIAGNOSIS — R252 Cramp and spasm: Secondary | ICD-10-CM | POA: Diagnosis not present

## 2019-01-05 NOTE — Therapy (Addendum)
[place], [place], [address] Phone: [phone]   Fax:  [phone]  Physical Therapy Treatment  Patient Details  Name: Sara Day MRN: [id number] Date of Birth: June 28, 1966 Referring Provider (PT): Kathrene Alu   Encounter Date: 01/05/2019  PT End of Session - 01/05/19 0942    Visit Number  4    Number of Visits  13    Date for PT Re-Evaluation  01/17/19    PT Start Time  0941   pt arrived 8 min late   PT Stop Time  1020    PT Time Calculation (min)  39 min    Activity Tolerance  Patient tolerated treatment well;Patient limited by pain    Behavior During Therapy  [facility] for tasks assessed/performed       Past Medical History:  Diagnosis Date  . Asthma   . Breast mass   . Hypertension     Past Surgical History:  Procedure Laterality Date  . BREAST EXCISIONAL BIOPSY      There were no vitals filed for this visit.  Subjective Assessment - 01/05/19 0943    Subjective  " I am still pretty sore today, the belt helped and I ordered"    Patient Stated Goals  I want to interact with my kids I work with , go to the mall with dtr and grand dtr. /and shop    Currently in Pain?  Yes    Pain Score  5     Pain Orientation  Right         [facility] PT Assessment - 01/05/19 0001      Assessment   Medical Diagnosis  Acute left sided back pain without sciatica    Referring Provider (PT)  Kathrene Alu                   [facility] Adult PT Treatment/Exercise - 01/05/19 0001      Lumbar Exercises: Stretches   Other Lumbar Stretch Exercise  gentle QL stretch 2 x 30 seconds over ball focusing on the L      Knee/Hip Exercises: Aerobic   Nustep  L4 x 5 min with UE/LE   keeping SIJ belt on     Knee/Hip Exercises: Supine   Hip Adduction Isometric  2 sets;10 reps   holding 3 seconds   Other Supine Knee/Hip Exercises  clamshells 3 x 10 with red theraband      Moist Heat Therapy    Number Minutes Moist Heat  10 Minutes    Moist Heat Location  Lumbar Spine   in sitting     Electrical Stimulation   Electrical Stimulation Location  L QL    Electrical Stimulation Action  IFC    Electrical Stimulation Parameters  19 ma; 80-150 hz, 10 min scanning 100%    Electrical Stimulation Goals  Pain      Manual Therapy   Manual therapy comments  MTPR along the L QL with gradual pressure progression               PT Short Term Goals - 12/06/18 1657      PT SHORT TERM GOAL #1   Title  "Independent with initial HEP    Time  2    Period  Weeks    Status  New    Target Date  12/20/18      PT SHORT TERM GOAL #2   Title  Pt will be given  information concerning purchase of TENS unit if E-stim effective initially    Baseline  tried IFC estim with good result    Time  2    Period  Weeks    Status  New    Target Date  12/20/18      PT SHORT TERM GOAL #3   Title  Pt will be able to sit with good posture and no compensatory leaning     Baseline  Pt must lean to right to unweight left side /pain    Time  2    Period  Weeks    Status  New    Target Date  12/20/18        PT Long Term Goals - 12/06/18 1544      PT LONG TERM GOAL #1   Title  "Demonstrate and verbalize techniques to reduce the risk of re-injury including: lifting, posture, body mechanics.     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    Time  6    Period  Weeks    Status  New    Target Date  01/17/19      PT LONG TERM GOAL #4   Title  "Pt will tolerate sitting 1 hour without increased pain to ride in car without increased pain     Time  6    Period  Weeks    Status  New    Target Date  01/17/19      PT LONG TERM GOAL #5   Title  Pt will be able to walk and stand in order to go shopping with dtr and grandchildren for at least 90 minutes or more    Baseline  unable to stand/walk for 10 minutes    Time  6    Period  Weeks    Status  New    Target Date  01/17/19      PT LONG TERM GOAL #6   Title  "FOTO will improve from 73%limitations   to   45%  indicating improved functional mobility.     Time  6    Period  Weeks    Status  New    Target Date  01/17/19            Plan - 01/05/19 1015    Clinical Impression Statement  pt continues to report improvement of pain with SIJ belt. She notes she did purchae a SIJ and will bring it in. worked on gentle hip strengthening which pt contineus to guard significantly due to pain and tightness inthe L QL / SIJ. continued MHP and Stim to calm down pain.     PT Next Visit Plan  Review use of SI belt if patient acquires her own. Progress with strength and mobiity training with use of belt to relieve pain. Assess SI joint provocation as indicated as well as lumbar  spine mechanics.     Consulted and Agree with Plan of Care  Patient       Patient will benefit from skilled therapeutic intervention in order to improve the following deficits and impairments:  Pain, Improper body mechanics, Increased fascial restricitons, Increased muscle spasms, Impaired flexibility, Difficulty walking, Decreased strength, Decreased mobility, Decreased range of motion, Decreased activity tolerance  Visit Diagnosis: Acute right-sided low back pain, unspecified whether sciatica present  Muscle weakness (generalized)  Abnormal posture  Cramp and spasm     Problem List Patient Active Problem List   Diagnosis Date Noted  . Acute left-sided low back pain without sciatica 11/04/2018  . Pap smear for cervical cancer screening 07/08/2017  . Conjunctivitis 07/05/2017  . Flank pain 06/10/2016   . Vaginal discharge 07/05/2014  . CIN I (cervical intraepithelial neoplasia I) 05/07/2014  . Abnormality of cervix 04/27/2014  . Asthma 04/27/2014  . Exposure to communicable disease 08/08/2012  . Contraception management 04/03/2011  . Upper back pain 01/14/2011  . HYPERCHOLESTEROLEMIA 05/21/2009  . Essential hypertension 05/21/2009  . GERD 05/07/2008  . DEPRESSIVE DISORDER, NOS 12/16/2006   Starr Lake PT, DPT, LAT, ATC  01/05/19  10:19 AM      [place] [facility] [address] [place], [place], [address] Phone: [phone]   Fax:  [phone]  Name: Sara Day MRN: [id number] Date of Birth: July 14, 1966

## 2019-01-10 ENCOUNTER — Ambulatory Visit: Payer: BLUE CROSS/BLUE SHIELD | Admitting: Physical Therapy

## 2019-01-17 ENCOUNTER — Ambulatory Visit: Payer: BLUE CROSS/BLUE SHIELD | Admitting: Physical Therapy

## 2019-01-24 ENCOUNTER — Telehealth: Payer: Self-pay | Admitting: Physical Therapy

## 2019-01-24 NOTE — Telephone Encounter (Signed)
Sara Day was contacted today regarding the temporary reduction of OP Rehab Services due to concerns for community transmission of Covid-19.    Therapist advised the patient to continue to perform their HEP and assured they had no unanswered questions at this time.  The patient was offered and declined the continuation in their POC by using methods such as an e-visit, virtual check in, or telehealth visit.    Outpatient Rehabilitation Services will follow up with this client when we are able to safely resume care at the [facility] in person.   Patient is aware we can be reached by telephone during limited business hours in the meantime.

## 2019-02-14 ENCOUNTER — Other Ambulatory Visit: Payer: Self-pay | Admitting: Family Medicine

## 2019-02-14 DIAGNOSIS — I10 Essential (primary) hypertension: Secondary | ICD-10-CM

## 2019-02-21 ENCOUNTER — Telehealth: Payer: Self-pay | Admitting: Physical Therapy

## 2019-02-21 NOTE — Telephone Encounter (Signed)
Left message requesting pt contact our office for preference of continuing PT POC. Jaycie Kregel C. Danh Bayus PT, DPT 02/21/19 9:57 AM

## 2019-02-23 ENCOUNTER — Ambulatory Visit: Payer: BC Managed Care – PPO | Attending: Family Medicine | Admitting: Physical Therapy

## 2019-02-23 ENCOUNTER — Other Ambulatory Visit: Payer: Self-pay

## 2019-02-23 ENCOUNTER — Encounter: Payer: Self-pay | Admitting: Physical Therapy

## 2019-02-23 DIAGNOSIS — M6281 Muscle weakness (generalized): Secondary | ICD-10-CM

## 2019-02-23 DIAGNOSIS — R293 Abnormal posture: Secondary | ICD-10-CM

## 2019-02-23 DIAGNOSIS — R252 Cramp and spasm: Secondary | ICD-10-CM | POA: Diagnosis not present

## 2019-02-23 DIAGNOSIS — M545 Low back pain, unspecified: Secondary | ICD-10-CM

## 2019-02-23 NOTE — Therapy (Addendum)
[place], [place], [address] Phone: [phone]   Fax:  [phone]  Physical Therapy Treatment/Re-eval  Patient Details  Name: Sara Day MRN: [id number] Date of Birth: Dec 02, 1965 Referring Provider (PT): Kathrene Alu   Encounter Date: 02/23/2019  PT End of Session - 02/23/19 1546    Visit Number  5    Number of Visits  13    Date for PT Re-Evaluation  04/07/19    Authorization Type  BCBS    PT Start Time  1502    PT Stop Time  1543    PT Time Calculation (min)  41 min    Activity Tolerance  Patient tolerated treatment well    Behavior During Therapy  [facility] for tasks assessed/performed       Past Medical History:  Diagnosis Date  . Asthma   . Breast mass   . Hypertension     Past Surgical History:  Procedure Laterality Date  . BREAST EXCISIONAL BIOPSY      There were no vitals filed for this visit.  Subjective Assessment - 02/23/19 1504    Subjective  getting better. the belt is helpful but I don't want to wear it for forever.     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 /and shop    Currently in Pain?  Yes    Pain Score  4     Pain Location  Back    Pain Orientation  Left    Pain Descriptors / Indicators  Aching    Aggravating Factors   more of just a constant pain    Pain Relieving Factors  pillow under side         [facility] PT Assessment - 02/23/19 0001      Assessment   Medical Diagnosis  Acute left sided back pain without sciatica    Referring Provider (PT)  Maia Breslow C    Onset Date/Surgical Date  10/20/18    Hand Dominance  Right      Precautions   Precautions  None      Restrictions   Weight Bearing Restrictions  No      Balance Screen   Has the patient fallen in the past 6 months  No      Prior Function   Level of Independence  Independent    Vocation  Full time employment    Vocation Requirements  daycare provider      Cognition   Overall Cognitive Status  Within Functional Limits for tasks assessed      Sensation   Additional Comments  [facility]      Posture/Postural Control   Posture Comments  anterior pelvic tilt with increased lumbar lordosis; Lt genuvalgum      Strength   Overall Strength Comments  able to demo gross 3/5 to move against gravity but unable to tolerate MMT      Palpation   Palpation comment  Rt ASIS elevation in standing                   [facility] Adult PT Treatment/Exercise - 02/23/19 0001      Manual Therapy   Joint Mobilization  Gr1&2 bil SIJ    Soft tissue mobilization  bilateral QL STM & trigger point release             PT Education - 02/23/19  [id number]    Education Details  anatomy of condition, heel lift    Person(s) Educated  Patient    Methods  Explanation    Comprehension  Verbalized understanding;Need further instruction       PT Short Term Goals - 02/23/19 1506      PT SHORT TERM GOAL #1   Title  "Independent with initial HEP    Status  Achieved      PT SHORT TERM GOAL #2   Title  Pt will be given information concerning purchase of TENS unit if E-stim effective initially    Status  Achieved      PT SHORT TERM GOAL #3   Title  Pt will be able to sit with good posture and no compensatory leaning     Baseline  able to independently correct    Status  Achieved        PT Long Term Goals - 02/23/19 1507      PT LONG TERM GOAL #1   Title  "Demonstrate and verbalize techniques to reduce the risk of re-injury including: lifting, posture, body mechanics.     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     Status  Deferred            Plan - 02/23/19 1546    Clinical Impression Statement  Pt presents with decreased pain overall but still significantly limited in fucntional movements. In standing, pt presented in standing with Rt ASIS elevation and anterior rotation. Placed a 3 layer heel lift under her right heel which immediately decreased her pain. She was wearing slide-on slippers so I was unable to evaluate gait with lift. trigger points in bil QL reduced with manual therapy.  multiple cues required for cat/camel/child pose stretch- video emailed. asked her to wear lift in tennis shoe and we will evaluate outcome at next visit.     PT Treatment/Interventions  ADLs/Self Care Home Management;Cryotherapy;Electrical Stimulation;Iontophoresis 4mg /ml Dexamethasone;Moist Heat;Ultrasound;Traction;Therapeutic exercise;Therapeutic activities;Stair training;Gait training;Neuromuscular re-education;Patient/family education;Manual techniques;Passive range of motion;Dry needling;Taping;Spinal Manipulations    PT Next Visit Plan  check lift outcome, core stabilization     PT Home Exercise Plan  wear lift, sit without crossing ankles  Consulted and Agree with Plan of Care  Patient     ADDENDUM/CORRECTION: lift placed under LEFT heel.    Patient will benefit from skilled therapeutic intervention in order to  improve the following deficits and impairments:  Pain, Improper body mechanics, Increased fascial restricitons, Increased muscle spasms, Impaired flexibility, Difficulty walking, Decreased strength, Decreased mobility, Decreased range of motion, Decreased activity tolerance  Visit Diagnosis: Acute right-sided low back pain, unspecified whether sciatica present - Plan: PT plan of care cert/re-cert  Muscle weakness (generalized) - Plan: PT plan of care cert/re-cert  Abnormal posture - Plan: PT plan of care cert/re-cert  Cramp and spasm - Plan: PT plan of care cert/re-cert     Problem List Patient Active Problem List   Diagnosis Date Noted  . Acute left-sided low back pain without sciatica 11/04/2018  . Pap smear for cervical cancer screening 07/08/2017  . Conjunctivitis 07/05/2017  . Flank pain 06/10/2016  . Vaginal discharge 07/05/2014  . CIN I (cervical intraepithelial neoplasia I) 05/07/2014  . Abnormality of cervix 04/27/2014  . Asthma 04/27/2014  . Exposure to communicable disease 08/08/2012  . Contraception management 04/03/2011  . Upper back pain 01/14/2011  . HYPERCHOLESTEROLEMIA 05/21/2009  . Essential hypertension 05/21/2009  . GERD 05/07/2008  . DEPRESSIVE DISORDER, NOS 12/16/2006   Virgal Warmuth C. Angelissa Supan PT, DPT 02/23/19 4:03 PM   [facility] [address] [place], [place], [address] Phone: [phone]   Fax:  [phone]  Name: Sara Day MRN: [id number] Date of Birth: 1966-02-06

## 2019-03-01 ENCOUNTER — Ambulatory Visit: Payer: BC Managed Care – PPO | Admitting: Physical Therapy

## 2019-03-01 ENCOUNTER — Other Ambulatory Visit: Payer: Self-pay

## 2019-03-01 ENCOUNTER — Encounter: Payer: Self-pay | Admitting: Physical Therapy

## 2019-03-01 DIAGNOSIS — M545 Low back pain, unspecified: Secondary | ICD-10-CM

## 2019-03-01 DIAGNOSIS — R293 Abnormal posture: Secondary | ICD-10-CM | POA: Diagnosis not present

## 2019-03-01 DIAGNOSIS — R252 Cramp and spasm: Secondary | ICD-10-CM | POA: Diagnosis not present

## 2019-03-01 DIAGNOSIS — M6281 Muscle weakness (generalized): Secondary | ICD-10-CM

## 2019-03-01 NOTE — Therapy (Signed)
[place], [place], [address] Phone: [phone]   Fax:  [phone]  Physical Therapy Treatment  Patient Details  Name: Sara Day MRN: [id number] Date of Birth: 1965-11-01 Referring Provider (PT): Kathrene Alu   Encounter Date: 03/01/2019  PT End of Session - 03/01/19 1120    Visit Number  6    Number of Visits  13    Date for PT Re-Evaluation  04/07/19    Authorization Type  BCBS    PT Start Time  1119   pt arrived late   PT Stop Time  1156    PT Time Calculation (min)  37 min    Activity Tolerance  Patient tolerated treatment well    Behavior During Therapy  [facility] for tasks assessed/performed       Past Medical History:  Diagnosis Date  . Asthma   . Breast mass   . Hypertension     Past Surgical History:  Procedure Laterality Date  . BREAST EXCISIONAL BIOPSY      There were no vitals filed for this visit.  Subjective Assessment - 03/01/19 1120    Subjective  Felt like I was taller on the Left with the lift in. I am feeling really good today.     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 /and shop    Currently in Pain?  No/denies                       [facility] Adult PT Treatment/Exercise - 03/01/19 0001      Exercises   Exercises  Lumbar      Lumbar Exercises: Stretches   Single Knee to Chest Stretch Limitations  both x2 hold 3 deep breaths    Lower Trunk Rotation  5 reps   both sides     Lumbar Exercises: Supine   Pelvic Tilt Limitations  posterior- heavy cuing required    Bridge Limitations  ball bw knees, heavy cues    Basic Lumbar Stabilization Limitations  hooklying ball squeeze 5s holds      Knee/Hip Exercises: Aerobic   Nustep  5 min L5 LE only      Manual Therapy   Soft tissue mobilization  IASTM Lt QL, piriformis             PT Education - 03/01/19 1156    Education  Details  how to wear SIJ belt    Person(s) Educated  Patient    Methods  Explanation;Demonstration;Tactile cues;Verbal cues    Comprehension  Need further instruction;Verbalized understanding;Returned demonstration;Verbal cues required;Tactile cues required       PT Short Term Goals - 02/23/19 1506      PT SHORT TERM GOAL #1   Title  "Independent with initial HEP    Status  Achieved      PT SHORT TERM GOAL #2   Title  Pt will be given information concerning purchase of TENS unit if E-stim effective initially    Status  Achieved      PT SHORT TERM GOAL #3   Title  Pt will be able to sit with good posture and no compensatory leaning     Baseline  able to independently correct    Status  Achieved        PT Long Term Goals - 02/23/19 1507  PT LONG TERM GOAL #1   Title  "Demonstrate and verbalize techniques to reduce the risk of re-injury including: lifting, posture, body mechanics.     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     Status  Deferred            Plan - 03/01/19  1157    Clinical Impression Statement  Pt required a lot of cuing for form with pelvic tilt. Poor body awareness for positioning. Pt was wearing SIJ belt above pelvis and was educated on proper wear. since she is feeling good, I asked her not to wear heel lift (in Left shoe) unless pain worsens.    PT Treatment/Interventions  ADLs/Self Care Home Management;Cryotherapy;Electrical Stimulation;Iontophoresis 4mg /ml Dexamethasone;Moist Heat;Ultrasound;Traction;Therapeutic exercise;Therapeutic activities;Stair training;Gait training;Neuromuscular re-education;Patient/family education;Manual techniques;Passive range of motion;Dry needling;Taping;Spinal Manipulations    PT Next Visit Plan  did she need to add heel lift back, continue core strengthening    PT Home Exercise Plan  wear lift, sit without crossing ankles; posterior pelvic tilt    Consulted and Agree with Plan of Care  Patient       Patient will benefit from skilled therapeutic intervention in order to improve the following deficits and impairments:  Pain, Improper body mechanics, Increased fascial restricitons, Increased muscle spasms, Impaired flexibility, Difficulty walking, Decreased strength, Decreased mobility, Decreased range of motion, Decreased activity tolerance  Visit Diagnosis: Acute right-sided low back pain, unspecified whether sciatica present  Muscle weakness (generalized)  Abnormal posture  Cramp and spasm     Problem List Patient Active Problem List   Diagnosis Date Noted  . Acute left-sided low back pain without sciatica 11/04/2018  . Pap smear for cervical cancer screening 07/08/2017  . Conjunctivitis 07/05/2017  . Flank pain 06/10/2016  . Vaginal discharge 07/05/2014  . CIN I (cervical intraepithelial neoplasia I) 05/07/2014  . Abnormality of cervix 04/27/2014  . Asthma 04/27/2014  . Exposure to communicable disease 08/08/2012  . Contraception management 04/03/2011  . Upper back pain 01/14/2011  .  HYPERCHOLESTEROLEMIA 05/21/2009  . Essential hypertension 05/21/2009  . GERD 05/07/2008  . DEPRESSIVE DISORDER, NOS 12/16/2006    Josafat Enrico C. Riddick Nuon PT, DPT 03/01/19 12:00 PM   [facility] [address] [place], [place], [address] Phone: [phone]   Fax:  [phone]  Name: Sara Day MRN: [id number] Date of Birth: 03-23-1966

## 2019-03-02 ENCOUNTER — Ambulatory Visit: Payer: BC Managed Care – PPO | Admitting: Physical Therapy

## 2019-03-02 ENCOUNTER — Encounter: Payer: Self-pay | Admitting: Physical Therapy

## 2019-03-02 DIAGNOSIS — M6281 Muscle weakness (generalized): Secondary | ICD-10-CM | POA: Diagnosis not present

## 2019-03-02 DIAGNOSIS — R252 Cramp and spasm: Secondary | ICD-10-CM

## 2019-03-02 DIAGNOSIS — R293 Abnormal posture: Secondary | ICD-10-CM

## 2019-03-02 DIAGNOSIS — M545 Low back pain, unspecified: Secondary | ICD-10-CM

## 2019-03-02 NOTE — Therapy (Signed)
[place], [place], [address] Phone: [phone]   Fax:  [phone]  Physical Therapy Treatment  Patient Details  Name: Sara Day MRN: [id number] Date of Birth: [birth year] Referring Provider (PT): Kathrene Alu   Encounter Date: 03/02/2019  PT End of Session - 03/02/19 1454    Visit Number  7    Number of Visits  13    Date for PT Re-Evaluation  04/07/19    Authorization Type  BCBS    PT Start Time  1447    PT Stop Time  1526    PT Time Calculation (min)  39 min    Activity Tolerance  Patient tolerated treatment well    Behavior During Therapy  [facility] for tasks assessed/performed       Past Medical History:  Diagnosis Date  . Asthma   . Breast mass   . Hypertension     Past Surgical History:  Procedure Laterality Date  . BREAST EXCISIONAL BIOPSY      There were no vitals filed for this visit.  Subjective Assessment - 03/02/19 1451    Subjective  " I am doing much better, but I still have some soreness inthe side. I haven't used the heel lift because I am feeling better"    Patient Stated Goals  I want to interact with my kids I work with , go to the mall with dtr and grand dtr. /and shop    Currently in Pain?  Yes    Pain Score  2     Pain Location  Back    Pain Orientation  Left    Pain Descriptors / Indicators  Aching    Pain Type  Chronic pain    Aggravating Factors   standing for long periods of time                       [facility] Adult PT Treatment/Exercise - 03/02/19 1454      Lumbar Exercises: Stretches   Active Hamstring Stretch  2 reps;30 seconds    Single Knee to Chest Stretch Limitations  both x2 hold 3 deep breaths    Quadruped Mid Back Stretch  2 reps;30 seconds   childs pose     Lumbar Exercises: Aerobic   Nustep  L5 x 5 min UE/LE      Lumbar Exercises: Supine   Pelvic Tilt  2 reps;10 reps;5 seconds   tactile cues for proper form   Bent Knee  Raise  10 reps;1 second    Dead Bug Limitations  --    Basic Lumbar Stabilization Limitations  hooklying ball squeeze 5s holds 1 x 15      Manual Therapy   Manual Therapy  Muscle Energy Technique    Manual therapy comments  MTPR along the L QL with gradual pressure progression    Soft tissue mobilization  IASTM Lt QL, piriformis    Muscle Energy Technique  resisted L hip flexion 1 x 10 holding 5 seconds   cues for motivation            PT Education - 03/02/19 1527    Education Details  updated HPE, anatomy regarding SIJ and muscles contributing to pain. Continued fear avoidance will cause muscle to get tighter causing increased pain and discussed importance of stretching.     Person(s) Educated  Patient    Methods  Explanation;Verbal cues;Handout    Comprehension  Verbalized understanding;Verbal cues  required       PT Short Term Goals - 02/23/19 1506      PT SHORT TERM GOAL #1   Title  "Independent with initial HEP    Status  Achieved      PT SHORT TERM GOAL #2   Title  Pt will be given information concerning purchase of TENS unit if E-stim effective initially    Status  Achieved      PT SHORT TERM GOAL #3   Title  Pt will be able to sit with good posture and no compensatory leaning     Baseline  able to independently correct    Status  Achieved        PT Long Term Goals - 02/23/19 1507      PT LONG TERM GOAL #1   Title  "Demonstrate and verbalize techniques to reduce the risk of re-injury including: lifting, posture, body mechanics.     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     Status  Deferred            Plan - 03/02/19 1524    Clinical Impression Statement  She is doing better than the last time this PT saw the pt, but she continues to demonstrate fear avoidance behavior which is increased while being in supine. Educated about anatomy and benefits of stretching/ strengthening which she noted understanding. end of session she reported pain decreased compared when she came in depsite multiple attempts at [place] throughout all exercises.     PT Treatment/Interventions  ADLs/Self Care Home Management;Cryotherapy;Electrical Stimulation;Iontophoresis 4mg /ml Dexamethasone;Moist Heat;Ultrasound;Traction;Therapeutic exercise;Therapeutic activities;Stair training;Gait training;Neuromuscular re-education;Patient/family education;Manual techniques;Passive range of motion;Dry needling;Taping;Spinal Manipulations    PT Next Visit Plan  did she need to add heel lift back, continue core strengthening, SIJ anterior rotation deficit    PT Home Exercise Plan  wear lift, sit without crossing ankles; posterior pelvic tilt    Consulted and Agree with Plan of Care  Patient       Patient will benefit from skilled therapeutic intervention in order to improve the following deficits and impairments:  Pain, Improper body mechanics, Increased fascial restricitons, Increased muscle spasms, Impaired flexibility, Difficulty walking, Decreased strength,  Decreased mobility,  Decreased range of motion, Decreased activity tolerance  Visit Diagnosis: Acute right-sided low back pain, unspecified whether sciatica present  Muscle weakness (generalized)  Abnormal posture  Cramp and spasm     Problem List Patient Active Problem List   Diagnosis Date Noted  . Acute left-sided low back pain without sciatica 11/04/2018  . Pap smear for cervical cancer screening 07/08/2017  . Conjunctivitis 07/05/2017  . Flank pain 06/10/2016  . Vaginal discharge 07/05/2014  . CIN I (cervical intraepithelial neoplasia I) 05/07/2014  . Abnormality of cervix 04/27/2014  . Asthma 04/27/2014  . Exposure to communicable disease 08/08/2012  . Contraception management 04/03/2011  . Upper back pain 01/14/2011  . HYPERCHOLESTEROLEMIA 05/21/2009  . Essential hypertension 05/21/2009  . GERD 05/07/2008  . DEPRESSIVE DISORDER, NOS 12/16/2006   Starr Lake PT, DPT, LAT, ATC  03/02/19  3:29 PM      [facility] [address] [place], [place], [address] Phone: [phone]   Fax:  [phone]  Name: ESTI DEMELLO MRN: [id number] Date of Birth: 02-09-1966

## 2019-03-06 ENCOUNTER — Ambulatory Visit: Payer: BC Managed Care – PPO | Admitting: Physical Therapy

## 2019-03-06 ENCOUNTER — Telehealth: Payer: Self-pay | Admitting: Physical Therapy

## 2019-03-06 NOTE — Telephone Encounter (Signed)
LVM regarding missed appointment today. Discussed with her next appointment date/time is and if she cannot make that to please call us and we can cancel or reschedule that for her.

## 2019-03-08 ENCOUNTER — Ambulatory Visit: Payer: BC Managed Care – PPO | Admitting: Physical Therapy

## 2019-03-08 ENCOUNTER — Other Ambulatory Visit: Payer: Self-pay

## 2019-03-09 ENCOUNTER — Ambulatory Visit: Payer: BC Managed Care – PPO | Admitting: Physical Therapy

## 2019-03-09 ENCOUNTER — Encounter: Payer: Self-pay | Admitting: Physical Therapy

## 2019-03-09 DIAGNOSIS — R252 Cramp and spasm: Secondary | ICD-10-CM

## 2019-03-09 DIAGNOSIS — R293 Abnormal posture: Secondary | ICD-10-CM | POA: Diagnosis not present

## 2019-03-09 DIAGNOSIS — M6281 Muscle weakness (generalized): Secondary | ICD-10-CM

## 2019-03-09 DIAGNOSIS — M545 Low back pain, unspecified: Secondary | ICD-10-CM

## 2019-03-09 NOTE — Therapy (Signed)
[place], [place], [address] Phone: [phone]   Fax:  [phone]  Physical Therapy Treatment  Patient Details  Name: Sara Day MRN: [id number] Date of Birth: 13-Sep-1966 Referring Provider (PT): Kathrene Alu   Encounter Date: 03/09/2019  PT End of Session - 03/09/19 1614    Visit Number  8    Number of Visits  13    Date for PT Re-Evaluation  04/07/19    Authorization Type  BCBS    PT Start Time  1537    PT Stop Time  1616    PT Time Calculation (min)  39 min    Activity Tolerance  Patient tolerated treatment well    Behavior During Therapy  [facility] for tasks assessed/performed       Past Medical History:  Diagnosis Date  . Asthma   . Breast mass   . Hypertension     Past Surgical History:  Procedure Laterality Date  . BREAST EXCISIONAL BIOPSY      There were no vitals filed for this visit.  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 /and shop    Currently in Pain?  Yes    Pain Score  4     Pain Location  Back    Pain Orientation  Left    Pain Descriptors / Indicators  Aching    Pain Type  Chronic pain    Pain Onset  More than a month ago    Pain Frequency  Constant    Aggravating Factors   standing for too long    Pain Relieving Factors  pillow under the side                       [facility] Adult PT Treatment/Exercise - 03/09/19 1546      Lumbar Exercises: Stretches   Active Hamstring Stretch  2 reps;30 seconds   standing with heel on step   Other Lumbar Stretch Exercise  bil low back stretching holding onto sink 2 x 30 sec    Other Lumbar Stretch Exercise  QL stretching in doorway 2 x 30 sec   demonstration for form     Lumbar Exercises: Aerobic   Nustep  L5 x 5 min  UE/LE      Manual Therapy   Manual therapy comments  MTPR along the L QL with gradual pressure progression             PT Education - 03/09/19 1613    Education Details  updated HEP for standing hip strengthening. Reviewed SIJ belt and benefit of wrapping around the hips to promote stability of the SIJ, versus having it wrapped high around the upper abdomin.    Person(s) Educated  Patient    Methods  Explanation;Verbal cues;Handout;Demonstration   used skeleton to education   Comprehension  Verbalized understanding;Verbal cues required       PT Short Term Goals - 02/23/19 1506      PT SHORT TERM GOAL #1   Title  "Independent with initial HEP    Status  Achieved      PT SHORT TERM GOAL #2   Title  Pt will be given information concerning purchase  of TENS unit if E-stim effective initially    Status  Achieved      PT SHORT TERM GOAL #3   Title  Pt will be able to sit with good posture and no compensatory leaning     Baseline  able to independently correct    Status  Achieved        PT Long Term Goals - 02/23/19 1507      PT LONG TERM GOAL #1   Title  "Demonstrate and verbalize techniques to reduce the risk of re-injury including: lifting, posture, body mechanics.     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     Status  Deferred            Plan - 03/09/19 1614    Clinical Impression Statement  pt reports increased pain today compared to last session. opted to perform stretching and exericse in standing position due to pt's difficulty with laying supine. addressed benefit of appropriate SIJ belt positioning and stretching of the QL. She still demonstrates frequent repositioning to avoid pain thoughout session and fatigues quickly with hip strengthening. following strengthening she reported feeling like pain dropped to 2/10.     PT Next Visit Plan  has she had to use heel lift/SIJ belt?, continue core strengthening, SIJ anterior rotation deficit, hip flexor activation, core strengthening.     PT Home Exercise Plan  wear lift, sit without crossing ankles; posterior pelvic tilt, resisted marching, standing hip abduction/ extension    Consulted and Agree with Plan of Care  Patient       Patient will benefit from skilled therapeutic intervention in order to improve the following deficits and impairments:  Pain, Improper body mechanics, Increased fascial restricitons, Increased muscle spasms, Impaired flexibility, Difficulty walking, Decreased strength, Decreased mobility, Decreased range of motion, Decreased activity tolerance  Visit Diagnosis: Acute right-sided low back pain, unspecified whether sciatica present  Muscle weakness (generalized)  Abnormal posture  Cramp and spasm     Problem List Patient Active Problem List   Diagnosis Date Noted  . Acute left-sided low back pain without sciatica 11/04/2018  . Pap smear for cervical cancer screening 07/08/2017  . Conjunctivitis 07/05/2017  .  Flank pain 06/10/2016  . Vaginal discharge 07/05/2014  .  CIN I (cervical intraepithelial neoplasia I) 05/07/2014  . Abnormality of cervix 04/27/2014  . Asthma 04/27/2014  . Exposure to communicable disease 08/08/2012  . Contraception management 04/03/2011  . Upper back pain 01/14/2011  . HYPERCHOLESTEROLEMIA 05/21/2009  . Essential hypertension 05/21/2009  . GERD 05/07/2008  . DEPRESSIVE DISORDER, NOS 12/16/2006   Starr Lake PT, DPT, LAT, ATC  03/09/19  4:21 PM      [facility] [address] [place], [place], [address] Phone: [phone]   Fax:  [phone]  Name: PAIDYN MCFERRAN MRN: [id number] Date of Birth: [birth year]

## 2019-03-14 ENCOUNTER — Other Ambulatory Visit: Payer: Self-pay

## 2019-03-14 ENCOUNTER — Ambulatory Visit: Payer: BC Managed Care – PPO | Admitting: Physical Therapy

## 2019-03-14 ENCOUNTER — Encounter: Payer: Self-pay | Admitting: Physical Therapy

## 2019-03-14 DIAGNOSIS — M6281 Muscle weakness (generalized): Secondary | ICD-10-CM | POA: Diagnosis not present

## 2019-03-14 DIAGNOSIS — R293 Abnormal posture: Secondary | ICD-10-CM

## 2019-03-14 DIAGNOSIS — R252 Cramp and spasm: Secondary | ICD-10-CM

## 2019-03-14 DIAGNOSIS — M545 Low back pain, unspecified: Secondary | ICD-10-CM

## 2019-03-14 NOTE — Therapy (Signed)
[place], [place], [address] Phone: [phone]   Fax:  [phone]  Physical Therapy Treatment  Patient Details  Name: Sara Day MRN: [id number] Date of Birth: December 09, 1965 Referring Provider (PT): Kathrene Alu   Encounter Date: 03/14/2019  PT End of Session - 03/14/19 1406    Visit Number  9    Number of Visits  13    Date for PT Re-Evaluation  04/07/19    Authorization Type  BCBS    PT Start Time  1403    PT Stop Time  1445    PT Time Calculation (min)  42 min    Activity Tolerance  Patient tolerated treatment well    Behavior During Therapy  [facility] for tasks assessed/performed       Past Medical History:  Diagnosis Date  . Asthma   . Breast mass   . Hypertension     Past Surgical History:  Procedure Laterality Date  . BREAST EXCISIONAL BIOPSY      There were no vitals filed for this visit.  Subjective Assessment - 03/14/19 1406    Subjective  It has been feeling good, I am doing my exercise.     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 /and shop    Currently in Pain?  No/denies         [facility] PT Assessment - 03/14/19 0001      Special Tests   Other special tests  5TSTS 20s      High Level Balance   High Level Balance Comments  SLS Rt 18s Lt 4s, unstable                   [facility] Adult PT Treatment/Exercise - 03/14/19 0001      Therapeutic Activites    Therapeutic Activities  ADL's    ADL's  bed mobility      Lumbar Exercises: Stretches   Other Lumbar Stretch Exercise  low back stretch pull from sink    Other Lumbar Stretch Exercise  seated physioball roll out      Lumbar Exercises: Standing   Other Standing Lumbar Exercises  standing hip hinge at counter- push up      Lumbar Exercises: Seated   Other Seated Lumbar Exercises  physioball press into thighs      Lumbar Exercises: Supine   Other Supine Lumbar Exercises  physioball roll in/out LE    Other Supine Lumbar Exercises  physioball legs side/side      Lumbar Exercises: Quadruped   Single Arm Raises Limitations  opp arm punches, red physioball under stomach      Knee/Hip Exercises: Stretches   Passive Hamstring Stretch  Both;30 seconds      Knee/Hip Exercises: Standing   Hip Extension  Both;20 reps;Knee straight    Extension Limitations  hip ext with elbows on counter      Manual Therapy   Soft tissue mobilization  IASTM Lt QL and lower lumbar paraspinals               PT Short Term Goals - 02/23/19 1506      PT SHORT TERM GOAL #1   Title  "Independent with initial HEP    Status  Achieved      PT SHORT TERM GOAL #2   Title  Pt will be given information concerning purchase of  TENS unit if E-stim effective initially    Status  Achieved      PT SHORT TERM GOAL #3   Title  Pt will be able to sit with good posture and no compensatory leaning     Baseline  able to independently correct    Status  Achieved        PT Long Term Goals - 02/23/19 1507      PT LONG TERM GOAL #1   Title  "Demonstrate and verbalize techniques to reduce the risk of re-injury including: lifting, posture, body mechanics.     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     Status  Deferred            Plan - 03/14/19 1449    Clinical Impression Statement  Heavy cuing today for form with exercise. Frequent reminders to stop arching lumbar spine and reaching back for her QL. Reported decreased pain when log rolling     PT Treatment/Interventions  ADLs/Self Care Home Management;Cryotherapy;Electrical Stimulation;Iontophoresis 4mg /ml Dexamethasone;Moist Heat;Ultrasound;Traction;Therapeutic exercise;Therapeutic activities;Stair training;Gait training;Neuromuscular re-education;Patient/family education;Manual techniques;Passive range of motion;Dry needling;Taping;Spinal Manipulations    PT Next Visit Plan  review log roll, try qped over physioball again    PT Home Exercise Plan  wear lift, sit without crossing ankles; posterior pelvic tilt, resisted marching, standing hip abduction/ extension    Consulted and Agree with Plan of Care  Patient       Patient will benefit from skilled therapeutic intervention in order to improve the following deficits and impairments:  Pain, Improper body mechanics, Increased fascial restricitons, Increased muscle spasms, Impaired flexibility, Difficulty walking, Decreased strength, Decreased mobility, Decreased range of motion, Decreased activity tolerance  Visit Diagnosis: Acute right-sided low back pain, unspecified whether sciatica present  Muscle weakness (generalized)  Abnormal posture  Cramp and spasm     Problem List Patient Active Problem List   Diagnosis Date Noted  . Acute left-sided low back pain without sciatica 11/04/2018  . Pap smear for cervical cancer screening 07/08/2017  . Conjunctivitis 07/05/2017  . Flank pain 06/10/2016  . Vaginal discharge 07/05/2014  .  CIN I (cervical intraepithelial neoplasia I) 05/07/2014  . Abnormality of cervix  04/27/2014  . Asthma 04/27/2014  . Exposure to communicable disease 08/08/2012  . Contraception management 04/03/2011  . Upper back pain 01/14/2011  . HYPERCHOLESTEROLEMIA 05/21/2009  . Essential hypertension 05/21/2009  . GERD 05/07/2008  . DEPRESSIVE DISORDER, NOS 12/16/2006   Alessio Bogan C. Ryleigh Esqueda PT, DPT 03/14/19 2:52 PM   [facility] [address] [place], [place], [address] Phone: [phone]   Fax:  [phone]  Name: Sara Day MRN: [id number] Date of Birth: Feb 27, 1966

## 2019-03-16 ENCOUNTER — Ambulatory Visit: Payer: BC Managed Care – PPO | Admitting: Physical Therapy

## 2019-03-22 ENCOUNTER — Ambulatory Visit: Payer: BC Managed Care – PPO | Attending: Family Medicine | Admitting: Physical Therapy

## 2019-03-22 ENCOUNTER — Encounter: Payer: Self-pay | Admitting: Physical Therapy

## 2019-03-22 ENCOUNTER — Other Ambulatory Visit: Payer: Self-pay

## 2019-03-22 DIAGNOSIS — M545 Low back pain, unspecified: Secondary | ICD-10-CM

## 2019-03-22 DIAGNOSIS — R293 Abnormal posture: Secondary | ICD-10-CM | POA: Diagnosis not present

## 2019-03-22 DIAGNOSIS — M6281 Muscle weakness (generalized): Secondary | ICD-10-CM

## 2019-03-22 DIAGNOSIS — R252 Cramp and spasm: Secondary | ICD-10-CM

## 2019-03-22 NOTE — Therapy (Signed)
[place], [place], [address] Phone: [phone]   Fax:  [phone]  Physical Therapy Treatment  Patient Details  Name: Sara Day MRN: [id number] Date of Birth: [birth year] Referring Provider (PT): Kathrene Alu   Encounter Date: 03/22/2019  PT End of Session - 03/22/19 0810    Visit Number  10    Number of Visits  13    Date for PT Re-Evaluation  04/07/19    Authorization Type  BCBS    PT Start Time  0803    PT Stop Time  0843    PT Time Calculation (min)  40 min    Activity Tolerance  Patient tolerated treatment well    Behavior During Therapy  [facility] for tasks assessed/performed       Past Medical History:  Diagnosis Date  . Asthma   . Breast mass   . Hypertension     Past Surgical History:  Procedure Laterality Date  . BREAST EXCISIONAL BIOPSY      There were no vitals filed for this visit.  Subjective Assessment - 03/22/19 0810    Subjective  I am feeling a lot better, I have been doing my exercises and getting out of bed the right way. Still have some difficulty bending over.     Currently in Pain?  No/denies         [facility] PT Assessment - 03/22/19 0001      Strength   Strength Assessment Site  Hip    Right/Left Hip  Right;Left    Right Hip Flexion  4+/5    Right Hip ABduction  4/5    Left Hip Flexion  3-/5    Left Hip ABduction  4+/5                   [facility] Adult PT Treatment/Exercise - 03/22/19 0001      Lumbar Exercises: Stretches   Single Knee to Chest Stretch Limitations  Lt knee to chest with belly breathing.     Lower Trunk Rotation Limitations  small ROM with cues to belly breathe    Other Lumbar Stretch Exercise  hooklying belly breathing      Lumbar Exercises: Aerobic   Nustep  5 min L5 UE & LE      Lumbar Exercises: Supine   Bent Knee Raise Limitations  attempted marching with breathing- heavy cuing required      Manual Therapy   Soft  tissue mobilization  trigger point release & STM to Lt paraspinals in Rt SL over pillow               PT Short Term Goals - 02/23/19 1506      PT SHORT TERM GOAL #1   Title  "Independent with initial HEP    Status  Achieved      PT SHORT TERM GOAL #2   Title  Pt will be given information concerning purchase of TENS unit if E-stim effective initially    Status  Achieved      PT SHORT TERM GOAL #3   Title  Pt will be able to sit with good posture and no compensatory leaning     Baseline  able to independently correct    Status  Achieved        PT Long Term Goals - 02/23/19 1507      PT LONG TERM GOAL #1   Title  "Demonstrate and verbalize techniques to reduce the  risk of re-injury including: lifting, posture, body mechanics.     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     Status  Deferred            Plan - 03/22/19 0848    Clinical Impression Statement  Pt still moves a lot during manual therapy and shifts when exercising due to back pain. Asked her to perform belly  breathing and Rt SKTC instead of reaching for back. Pt did experience a decrease in pain with breathing and stretching. Will return in 2 weeks for D/C vs ERO.     PT Treatment/Interventions  ADLs/Self Care Home Management;Cryotherapy;Electrical Stimulation;Iontophoresis 4mg /ml Dexamethasone;Moist Heat;Ultrasound;Traction;Therapeutic exercise;Therapeutic activities;Stair training;Gait training;Neuromuscular re-education;Patient/family education;Manual techniques;Passive range of motion;Dry needling;Taping;Spinal Manipulations    PT Home Exercise Plan  wear lift, sit without crossing ankles; posterior pelvic tilt, resisted marching, standing hip abduction/ extension, beginner hundred, march with breathing, SKTC with breathing    Consulted and Agree with Plan of Care  Patient       Patient will benefit from skilled therapeutic intervention in order to improve the following deficits and impairments:  Pain, Improper body mechanics, Increased fascial restricitons, Increased muscle spasms, Impaired flexibility, Difficulty walking, Decreased strength, Decreased mobility, Decreased range of motion, Decreased activity tolerance  Visit Diagnosis: Acute right-sided low back pain, unspecified whether sciatica present  Muscle weakness (generalized)  Abnormal posture  Cramp and spasm     Problem List Patient Active Problem List   Diagnosis Date Noted  . Acute left-sided low back pain without sciatica 11/04/2018  . Pap smear for cervical cancer screening 07/08/2017  . Conjunctivitis 07/05/2017  . Flank pain 06/10/2016  . Vaginal discharge 07/05/2014  . CIN I (cervical intraepithelial neoplasia I) 05/07/2014  . Abnormality of cervix 04/27/2014  . Asthma 04/27/2014  . Exposure to communicable disease 08/08/2012  . Contraception management 04/03/2011  . Upper back pain 01/14/2011  . HYPERCHOLESTEROLEMIA 05/21/2009  . Essential hypertension 05/21/2009  . GERD 05/07/2008  . DEPRESSIVE DISORDER, NOS  12/16/2006   Miranda Frese C. Eliza Green PT, DPT 03/22/19 8:50 AM   [facility] [address] [place], [place], [address] Phone: [phone]   Fax:  [phone]  Name: Sara Day MRN: [id number] Date of Birth: [birth year]

## 2019-03-24 ENCOUNTER — Other Ambulatory Visit: Payer: Self-pay | Admitting: Family Medicine

## 2019-03-24 DIAGNOSIS — I10 Essential (primary) hypertension: Secondary | ICD-10-CM

## 2019-04-05 ENCOUNTER — Encounter: Payer: Self-pay | Admitting: Physical Therapy

## 2019-04-05 ENCOUNTER — Ambulatory Visit: Payer: BC Managed Care – PPO | Admitting: Physical Therapy

## 2019-04-05 ENCOUNTER — Other Ambulatory Visit: Payer: Self-pay

## 2019-04-05 DIAGNOSIS — R293 Abnormal posture: Secondary | ICD-10-CM | POA: Diagnosis not present

## 2019-04-05 DIAGNOSIS — M6281 Muscle weakness (generalized): Secondary | ICD-10-CM

## 2019-04-05 DIAGNOSIS — R252 Cramp and spasm: Secondary | ICD-10-CM

## 2019-04-05 DIAGNOSIS — M545 Low back pain, unspecified: Secondary | ICD-10-CM

## 2019-04-05 NOTE — Therapy (Signed)
[place], [place], [address] Phone: [phone]   Fax:  [phone]  Physical Therapy Treatment/Discharge Summary Patient Details  Name: Sara Day MRN: [id number] Date of Birth: 07-26-1966 Referring Provider (PT): Kathrene Alu   Encounter Date: 04/05/2019  PT End of Session - 04/05/19 1544    Visit Number  11    Number of Visits  13    Date for PT Re-Evaluation  04/07/19    Authorization Type  BC[employer]    PT Start Time  1542   pt arrived late   PT Stop Time  1608    PT Time Calculation (min)  26 min    Activity Tolerance  Patient tolerated treatment well    Behavior During Therapy  [facility] for tasks assessed/performed       Past Medical History:  Diagnosis Date  . Asthma   . Breast mass   . Hypertension     Past Surgical History:  Procedure Laterality Date  . BREAST EXCISIONAL BIOPSY      There were no vitals filed for this visit.  Subjective Assessment - 04/05/19 1543    Subjective  Doing exercises every day. can play and move more with kids. Pain every once in a while    Patient Stated Goals  I want to interact with my kids I work with , go to the mall with dtr and grand dtr. /and shop    Currently in Pain?  No/denies         [facility] PT Assessment - 04/05/19 0001      Assessment   Medical Diagnosis  Acute left sided back pain without sciatica    Referring Provider (PT)  Maia Breslow C      Strength   Right Hip Flexion  4/5    Right Hip ABduction  5/5    Left Hip Flexion  4/5    Left Hip ABduction  5/5      Special Tests   Other special tests  14s      High Level Balance   High Level Balance Comments  SLS bilat 10+s pt is stable and able to correct own LOB but is nervous                           PT Education - 04/05/19 1611    Education Details  goals, progress, HEP importance of habits    Person(s) Educated  Patient    Methods  Explanation    Comprehension  Verbalized understanding       PT Short Term Goals - 02/23/19 1506      PT SHORT TERM GOAL #1   Title  "Independent with initial HEP    Status  Achieved      PT SHORT TERM GOAL #2   Title  Pt will be given information concerning purchase of TENS unit if E-stim effective initially    Status  Achieved      PT SHORT TERM GOAL #3   Title  Pt will be able to sit with good posture and no compensatory leaning     Baseline  able to independently correct    Status  Achieved        PT Long Term Goals - 04/05/19 1546      PT LONG TERM GOAL #1   Title  "Demonstrate and verbalize techniques to reduce the risk of re-injury including: lifting, posture, body mechanics.  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    Status  Partially Met      PT LONG TERM GOAL #5   Title  Pt will be able to walk and stand in order to go shopping with dtr and grandchildren for at least 90 minutes or more    Status  Achieved      PT LONG TERM GOAL #6   Title  "FOTO will improve from 73%limitations   to   45%  indicating improved functional mobility.     Baseline  40% limitation    Status  Achieved            Plan - 04/05/19 1611    Clinical Impression Statement  Pt has made significant progress since beginning PT  and is prepared for discharge. She reports good and bad days and we discussed that this will continue to be the case, especially  with her job requirements. No longer wearing lift in shoe because it makes her feel too tall on one side. Pt has a hard time remembering proper bed mobility and was provided with a printout. Encouraged her to contact us with any further questions.    PT Treatment/Interventions  ADLs/Self Care Home Management;Cryotherapy;Electrical Stimulation;Iontophoresis 47m/ml Dexamethasone;Moist Heat;Ultrasound;Traction;Therapeutic exercise;Therapeutic activities;Stair training;Gait training;Neuromuscular re-education;Patient/family education;Manual techniques;Passive range of motion;Dry needling;Taping;Spinal Manipulations    PT Home Exercise Plan  wear lift, sit without crossing ankles; posterior pelvic tilt, resisted marching, standing hip abduction/ extension, beginner hundred, march with breathing, SKTC with breathing    Consulted and Agree with Plan of Care  Patient       Patient will benefit from skilled therapeutic intervention in order to improve the following deficits and impairments:  Pain, Improper body mechanics, Increased fascial restricitons, Increased muscle spasms, Impaired flexibility, Difficulty walking, Decreased strength, Decreased mobility, Decreased range of motion, Decreased activity tolerance  Visit Diagnosis: 1. Acute right-sided low back pain, unspecified whether sciatica present   2. Muscle weakness (generalized)   3. Abnormal posture   4. Cramp and spasm        Problem List Patient Active Problem List   Diagnosis Date Noted  . Acute left-sided low back pain without sciatica 11/04/2018  . Pap smear for cervical cancer screening 07/08/2017  . Conjunctivitis 07/05/2017  . Flank pain 06/10/2016  . Vaginal discharge 07/05/2014  . CIN I (cervical intraepithelial neoplasia I) 05/07/2014  . Abnormality of cervix 04/27/2014  . Asthma 04/27/2014  . Exposure to communicable disease 08/08/2012  . Contraception management 04/03/2011  . Upper back pain 01/14/2011  .  HYPERCHOLESTEROLEMIA 05/21/2009  . Essential hypertension 05/21/2009  . GERD 05/07/2008  . DEPRESSIVE DISORDER, NOS 12/16/2006    PHYSICAL THERAPY DISCHARGE SUMMARY  [facility]  Current functional level related to goals / functional outcomes: See above   Remaining deficits: See above   Education / Equipment: Anatomy of condition, POC, HEP, exercise form/rationale  Plan: Patient agrees to discharge.  Patient goals were partially met. Patient is being discharged due to meeting the stated rehab goals.  ?????     Kenniel Bergsma C. Mindy Gali PT, DPT 04/05/19 4:15 PM   [facility] [address] [place], [place], [address] Phone: [phone]   Fax:  [phone]  Name: Sara Day MRN: [id number] Date of Birth: [birth year]

## 2019-05-11 ENCOUNTER — Other Ambulatory Visit: Payer: Self-pay | Admitting: *Deleted

## 2019-05-11 DIAGNOSIS — I10 Essential (primary) hypertension: Secondary | ICD-10-CM

## 2019-05-11 MED ORDER — ALBUTEROL SULFATE HFA 108 (90 BASE) MCG/ACT IN AERS: 2.0000 | INHALATION_SPRAY | RESPIRATORY_TRACT | 2 refills | Status: DC | PRN

## 2019-05-11 MED ORDER — HYDROCHLOROTHIAZIDE 12.5 MG PO TABS: 12.5000 mg | ORAL_TABLET | Freq: Every day | ORAL | 3 refills | Status: DC

## 2019-08-21 ENCOUNTER — Other Ambulatory Visit: Payer: Self-pay

## 2019-08-21 MED ORDER — TRIAMCINOLONE ACETONIDE 0.1 % EX OINT: 1.0000 "application " | TOPICAL_OINTMENT | Freq: Two times a day (BID) | CUTANEOUS | 0 refills | Status: DC

## 2019-08-23 ENCOUNTER — Telehealth: Payer: Self-pay | Admitting: *Deleted

## 2019-08-23 NOTE — Telephone Encounter (Signed)
Rx request for omeprazole, not on med list. Please advise. Dreyden Rohrman Kennon Holter, CMA

## 2019-08-24 NOTE — Telephone Encounter (Signed)
She will need an appointment to discuss. Thank you

## 2019-08-25 ENCOUNTER — Other Ambulatory Visit: Payer: Self-pay | Admitting: Student in an Organized Health Care Education/Training Program

## 2019-08-25 ENCOUNTER — Telehealth: Payer: Self-pay

## 2019-08-25 DIAGNOSIS — K219 Gastro-esophageal reflux disease without esophagitis: Secondary | ICD-10-CM

## 2019-08-25 MED ORDER — OMEPRAZOLE 40 MG PO CPDR: 40.0000 mg | DELAYED_RELEASE_CAPSULE | Freq: Every day | ORAL | 0 refills | Status: DC

## 2019-08-29 ENCOUNTER — Ambulatory Visit: Payer: Self-pay | Admitting: Family Medicine

## 2019-08-29 NOTE — Telephone Encounter (Signed)
Pt informed and schedule for an appt to see a doctor today. Deseree Kennon Holter, CMA

## 2019-10-26 NOTE — Telephone Encounter (Signed)
Responded to by other means.  Sara Day, [name]

## 2019-11-17 ENCOUNTER — Other Ambulatory Visit: Payer: Self-pay | Admitting: Student in an Organized Health Care Education/Training Program

## 2019-11-17 DIAGNOSIS — I10 Essential (primary) hypertension: Secondary | ICD-10-CM

## 2019-11-20 NOTE — Telephone Encounter (Signed)
Pt called and scheduled appointment for 12/11/19. Would patient be able to receive BP meds to hold her until scheduled appointment?  To PCP  Talbot Grumbling, RN

## 2019-11-22 ENCOUNTER — Other Ambulatory Visit: Payer: Self-pay | Admitting: Student in an Organized Health Care Education/Training Program

## 2019-11-22 DIAGNOSIS — I10 Essential (primary) hypertension: Secondary | ICD-10-CM

## 2019-11-22 MED ORDER — TRIAMCINOLONE ACETONIDE 0.1 % EX OINT: 1.0000 "application " | TOPICAL_OINTMENT | Freq: Two times a day (BID) | CUTANEOUS | 0 refills | Status: DC

## 2019-11-22 MED ORDER — HYDROCHLOROTHIAZIDE 12.5 MG PO TABS: 12.5000 mg | ORAL_TABLET | Freq: Every day | ORAL | 0 refills | Status: DC

## 2019-12-11 ENCOUNTER — Ambulatory Visit: Payer: Self-pay | Admitting: Student in an Organized Health Care Education/Training Program

## 2019-12-26 ENCOUNTER — Other Ambulatory Visit: Payer: Self-pay

## 2019-12-26 ENCOUNTER — Ambulatory Visit (INDEPENDENT_AMBULATORY_CARE_PROVIDER_SITE_OTHER): Payer: Self-pay | Admitting: Student in an Organized Health Care Education/Training Program

## 2019-12-26 VITALS — BP 165/80 | HR 53 | Wt 145.0 lb

## 2019-12-26 DIAGNOSIS — Z780 Asymptomatic menopausal state: Secondary | ICD-10-CM

## 2019-12-26 DIAGNOSIS — E7800 Pure hypercholesterolemia, unspecified: Secondary | ICD-10-CM

## 2019-12-26 DIAGNOSIS — I10 Essential (primary) hypertension: Secondary | ICD-10-CM

## 2019-12-26 MED ORDER — HYDROCHLOROTHIAZIDE 12.5 MG PO TABS: 12.5000 mg | ORAL_TABLET | Freq: Every day | ORAL | 1 refills | Status: DC

## 2019-12-26 NOTE — Progress Notes (Signed)
    SUBJECTIVE:   CHIEF COMPLAINT / HPI: missed periods  Patient originally wanted this appointment to discuss having a missed period for the last 3 months which is unusual as she is typically very regular.  Denies risk of pregnancy.  Her period has now returned and she has no concerns about this matter anymore.  Denies any mood/sleep/temperature issues indicating menopause.  HTN-patient would like to discuss her hypertension which is elevated to 165/80 today.  She endorses that she has been without her medication for several months.  She was well controlled on her blood pressure medication when she was taking every day.  Denies chest pain, palpitations, vision changes.  PERTINENT  PMH / PSH: Hypertension, hypercholesterolemia  OBJECTIVE:   BP (!) 165/80   Pulse (!) 53   Wt 145 lb (65.8 kg)   SpO2 99%   BMI 27.40 kg/m   General: NAD, pleasant, able to participate in exam Cardiac: RRR, normal heart sounds, no murmurs. 2+ radial and PT pulses bilaterally Respiratory: CTAB, normal effort, No wheezes, rales or rhonchi Abdomen: soft, nontender, nondistended, no hepatic or splenomegaly, +BS Extremities: no edema or cyanosis. WWP. Skin: warm and dry, no rashes noted Neuro: alert and oriented x4, no focal deficits Psych: Normal affect and mood   ASSESSMENT/PLAN:   Essential hypertension Refill HCTZ Obtain labs including CBC, BMP, lipid panel, hemoglobin A1c, TSH Follow-up appointment next week to go over labs and recheck blood pressure  Menopause Irregular periods likely due to menopause given her age. Patient was not open to discussing this at this time. Recommended she speak to her older sisters and mom with their experience and she can come back anytime to talk more about her menstrual cycles if they continue to be problems  HYPERCHOLESTEROLEMIA Elevated total and LDL cholesterol We will recommend starting statin with patient     [place]

## 2019-12-26 NOTE — Patient Instructions (Addendum)
It was a pleasure to see you today!  To summarize our discussion for this visit:  We are checking labs today to monitor your blood pressure  I will refill your blood pressure medication   Please see me again in a few weeks to follow up on your labs and blood pressure  If you have any more concerns about your menstrual cycle, please follow back up with me.  Some additional health maintenance measures we should update are: Health Maintenance Due  Topic Date Due  . MAMMOGRAM  01/27/2019  . INFLUENZA VACCINE  05/20/2019  .    Please return to our clinic to see me in 2-3 weeks  Call the clinic at [phone] if your symptoms worsen or you have any concerns.   Thank you for allowing me to take part in your care,  Dr. Doristine Mango

## 2019-12-27 LAB — CBC
Hematocrit: 39.4 % (ref 34.0–46.6)
Hemoglobin: 13 g/dL (ref 11.1–15.9)
MCH: 29 pg (ref 26.6–33.0)
MCHC: 33 g/dL (ref 31.5–35.7)
MCV: 88 fL (ref 79–97)
Platelets: 281 10*3/uL (ref 150–450)
RBC: 4.48 x10E6/uL (ref 3.77–5.28)
RDW: 12.5 % (ref 11.7–15.4)
WBC: 5.2 10*3/uL (ref 3.4–10.8)

## 2019-12-27 LAB — COMPREHENSIVE METABOLIC PANEL
ALT: 6 IU/L (ref 0–32)
AST: 13 IU/L (ref 0–40)
Albumin/Globulin Ratio: 1.4 (ref 1.2–2.2)
Albumin: 4.2 g/dL (ref 3.8–4.9)
Alkaline Phosphatase: 63 IU/L (ref 39–117)
BUN/Creatinine Ratio: 13 (ref 9–23)
BUN: 11 mg/dL (ref 6–24)
Bilirubin Total: 0.8 mg/dL (ref 0.0–1.2)
CO2: 23 mmol/L (ref 20–29)
Calcium: 9.3 mg/dL (ref 8.7–10.2)
Chloride: 105 mmol/L (ref 96–106)
Creatinine, Ser: 0.83 mg/dL (ref 0.57–1.00)
GFR calc Af Amer: 93 mL/min/{1.73_m2} (ref >59)
GFR calc non Af Amer: 81 mL/min/{1.73_m2} (ref >59)
Globulin, Total: 2.9 g/dL (ref 1.5–4.5)
Glucose: 87 mg/dL (ref 65–99)
Potassium: 3.8 mmol/L (ref 3.5–5.2)
Sodium: 139 mmol/L (ref 134–144)
Total Protein: 7.1 g/dL (ref 6.0–8.5)

## 2019-12-27 LAB — POCT GLYCOSYLATED HEMOGLOBIN (HGB A1C): Hemoglobin A1C: 5.1 % (ref 4.0–5.6)

## 2019-12-27 LAB — LIPID PANEL
Chol/HDL Ratio: 4.1 ratio (ref 0.0–4.4)
Cholesterol, Total: 248 mg/dL — ABNORMAL HIGH (ref 100–199)
HDL: 61 mg/dL (ref >39)
LDL Chol Calc (NIH): 163 mg/dL — ABNORMAL HIGH (ref 0–99)
Triglycerides: 136 mg/dL (ref 0–149)
VLDL Cholesterol Cal: 24 mg/dL (ref 5–40)

## 2019-12-27 LAB — TSH: TSH: 2.11 u[IU]/mL (ref 0.450–4.500)

## 2019-12-29 DIAGNOSIS — Z780 Asymptomatic menopausal state: Secondary | ICD-10-CM | POA: Insufficient documentation

## 2019-12-29 NOTE — Assessment & Plan Note (Signed)
Elevated total and LDL cholesterol We will recommend starting statin with patient

## 2019-12-29 NOTE — Assessment & Plan Note (Signed)
Refill HCTZ Obtain labs including CBC, BMP, lipid panel, hemoglobin A1c, TSH Follow-up appointment next week to go over labs and recheck blood pressure

## 2019-12-29 NOTE — Assessment & Plan Note (Signed)
Irregular periods likely due to menopause given her age. Patient was not open to discussing this at this time. Recommended she speak to her older sisters and mom with their experience and she can come back anytime to talk more about her menstrual cycles if they continue to be problems

## 2020-03-18 ENCOUNTER — Encounter (HOSPITAL_COMMUNITY): Payer: Self-pay

## 2020-03-18 ENCOUNTER — Ambulatory Visit (HOSPITAL_COMMUNITY)
Admission: EM | Admit: 2020-03-18 | Discharge: 2020-03-18 | Disposition: A | Discharge status: Home / Self Care | Payer: Self-pay | Attending: Physician Assistant | Admitting: Physician Assistant

## 2020-03-18 ENCOUNTER — Other Ambulatory Visit: Payer: Self-pay

## 2020-03-18 DIAGNOSIS — J309 Allergic rhinitis, unspecified: Secondary | ICD-10-CM

## 2020-03-18 DIAGNOSIS — J4521 Mild intermittent asthma with (acute) exacerbation: Secondary | ICD-10-CM

## 2020-03-18 DIAGNOSIS — L237 Allergic contact dermatitis due to plants, except food: Secondary | ICD-10-CM

## 2020-03-18 MED ORDER — CALAMINE EX LOTN: 1.0000 "application " | TOPICAL_LOTION | CUTANEOUS | 0 refills | Status: DC | PRN

## 2020-03-18 MED ORDER — CETIRIZINE HCL 10 MG PO TABS: 10.0000 mg | ORAL_TABLET | Freq: Every day | ORAL | 0 refills | Status: DC

## 2020-03-18 MED ORDER — PREDNISONE 20 MG PO TABS: 40.0000 mg | ORAL_TABLET | Freq: Every day | ORAL | 0 refills | Status: AC

## 2020-03-18 MED ORDER — HYDROXYZINE HCL 25 MG PO TABS: 25.0000 mg | ORAL_TABLET | Freq: Three times a day (TID) | ORAL | 0 refills | Status: DC | PRN

## 2020-03-18 NOTE — Discharge Instructions (Addendum)
Take the prednisone for the next 5 days Use the hydroxyzine for itch, this may make you sleepy so primarily use this at night, do not drive after taking this.   Take the Zyrtec daily Use calamine lotion as needed on the rash  You have worsening shortness of breath, develop chest pain, nausea or lightheaded feeling please go to the emergency department for further evaluation.  Schedule follow-up with your primary care to discuss your asthma

## 2020-03-18 NOTE — ED Triage Notes (Signed)
Pt states she was doing yard work a week ago and got poison ivy. Pt states poison ivy is on arms bilat, back, thighs and finger. Pt has patches of red raised areas.

## 2020-03-18 NOTE — ED Provider Notes (Addendum)
[place]    CSN: [id number] Arrival date & time: 03/18/20  1054      History   Chief Complaint Chief Complaint  Patient presents with  . Poison Ivy    HPI Sara Day is a 54 y.o. female.   Patient reports for evaluation of 1 week history of poison ivy rash as well as intermittent issues with her asthma.  She reports a rash starting after working in the yard about a week ago.  Is been very itchy since then, she notes it is on her arms, upper legs, back and chest.  Patient reports over the last week she has had to use her inhaler 3 times and she had not had to use her inhaler quite some time.  She reports she responds well to inhaler when she feels shortness of breath.  Denies chest pain, nausea or sweating when she has that shortness of breath episodes.  She reports this started after working in the yard when she got the rash as well.  She denies fever, chills, headache, sore throat, cough.  She reports she finished her Covid vaccines a month ago.  She denies chest pain or shortness of breath in clinic today.  She reports she did not take her high blood pressure medicine this morning.  She reports good blood pressure management at home.     Past Medical History:  Diagnosis Date  . Asthma   . Breast mass   . Hypertension     Patient Active Problem List   Diagnosis Date Noted  . Menopause 12/29/2019  . Acute left-sided low back pain without sciatica 11/04/2018  . Conjunctivitis 07/05/2017  . Vaginal discharge 07/05/2014  . CIN I (cervical intraepithelial neoplasia I) 05/07/2014  . Abnormality of cervix 04/27/2014  . Asthma 04/27/2014  . Upper back pain 01/14/2011  . HYPERCHOLESTEROLEMIA 05/21/2009  . Essential hypertension 05/21/2009  . GERD 05/07/2008    Past Surgical History:  Procedure Laterality Date  . BREAST EXCISIONAL BIOPSY      OB History   No obstetric history on file.      Home Medications    Prior to Admission medications     Medication Sig Start Date End Date Taking? Authorizing Provider  albuterol (PROVENTIL HFA) 108 (90 Base) MCG/ACT inhaler Inhale 2 puffs into the lungs every 4 (four) hours as needed for wheezing. 05/11/19   Doristine Mango L, DO  calamine lotion Apply 1 application topically as needed for itching. 03/18/20   Samanthia Howland, Marguerita Beards, PA-C  cetirizine (ZYRTEC ALLERGY) 10 MG tablet Take 1 tablet (10 mg total) by mouth daily. 03/18/20   Zaylei Mullane, Marguerita Beards, PA-C  hydrochlorothiazide (HYDRODIURIL) 12.5 MG tablet Take 1 tablet (12.5 mg total) by mouth daily. 12/26/19   Anderson, Chelsey L, DO  hydrOXYzine (ATARAX/VISTARIL) 25 MG tablet Take 1 tablet (25 mg total) by mouth every 8 (eight) hours as needed. 03/18/20   Traveion Ruddock, Marguerita Beards, PA-C  predniSONE (DELTASONE) 20 MG tablet Take 2 tablets (40 mg total) by mouth daily with breakfast for 5 days. 03/18/20 03/23/20  Monik Lins, Marguerita Beards, PA-C    Family History Family History  Problem Relation Age of Onset  . Heart attack Father   . Colon cancer Neg Hx   . Colon polyps Neg Hx   . Esophageal cancer Neg Hx   . Rectal cancer Neg Hx   . Stomach cancer Neg Hx     Social History Social History   Tobacco Use  . Smoking status:  Never Smoker  . Smokeless tobacco: Never Used  Substance Use Topics  . Alcohol use: Yes    Alcohol/week: 1.0 standard drinks    Types: 1 Glasses of wine per week  . Drug use: No     Allergies   Clindamycin/lincomycin, Codeine, Metronidazole, Sulfa antibiotics, and Sulfamethoxazole   Review of Systems Review of Systems   Physical Exam Triage Vital Signs ED Triage Vitals  Enc Vitals Group     BP 03/18/20 1200 (!) 186/93     Pulse Rate 03/18/20 1200 66     Resp 03/18/20 1200 16     Temp 03/18/20 1200 98.5 F (36.9 C)     Temp Source 03/18/20 1200 Oral     SpO2 03/18/20 1200 97 %     Weight 03/18/20 1203 130 lb (59 kg)     Height 03/18/20 1203 5\' 1"  (1.549 m)     Head Circumference --      Peak Flow --      Pain Score 03/18/20 1203 0      Pain Loc --      Pain Edu? --      Excl. in [place]? --    No data found.  Updated Vital Signs BP (!) 186/93   Pulse 66   Temp 98.5 F (36.9 C) (Oral)   Resp 16   Ht 5\' 1"  (1.549 m)   Wt 130 lb (59 kg)   SpO2 97%   BMI 24.56 kg/m   Visual Acuity Right Eye Distance:   Left Eye Distance:   Bilateral Distance:    Right Eye Near:   Left Eye Near:    Bilateral Near:     Physical Exam Vitals and nursing note reviewed.  Constitutional:      General: She is not in acute distress.    Appearance: She is well-developed. She is not ill-appearing.  HENT:     Head: Normocephalic and atraumatic.     Nose: Congestion present.     Comments: Pale boggy turbinates.  Clear rhinorrhea    Mouth/Throat:     Mouth: Mucous membranes are moist.     Pharynx: Oropharynx is clear.  Eyes:     Conjunctiva/sclera: Conjunctivae normal.  Cardiovascular:     Rate and Rhythm: Normal rate and regular rhythm.     Heart sounds: No murmur.  Pulmonary:     Effort: Pulmonary effort is normal. No respiratory distress.     Breath sounds: No stridor. Wheezing (Occasional inspiratory wheeze throughout fields.  No expiratory wheezes) present. No rhonchi or rales.     Comments: She is speaking in full sentences.  Saturating 97 to 100% on room air.  No accessory muscle usage. Abdominal:     Palpations: Abdomen is soft.     Tenderness: There is no abdominal tenderness.  Musculoskeletal:     Cervical back: Neck supple.     Right lower leg: No edema.     Left lower leg: No edema.  Skin:    General: Skin is warm and dry.     Comments: Erythematous rash consistent with poison ivy dermatitis on bilateral forearms, upper arm, low back and lower extremities.  No facial or oral involvement  Neurological:     Mental Status: She is alert and oriented to person, place, and time.      UC Treatments / Results  Labs (all labs ordered are listed, but only abnormal results are displayed) Labs Reviewed - No data to  display  EKG  Radiology No results found.  Procedures Procedures (including critical care time)  Medications Ordered in UC Medications - No data to display  Initial Impression / Assessment and Plan / UC Course  I have reviewed the triage vital signs and the nursing notes.  Pertinent labs & imaging results that were available during my care of the patient were reviewed by me and considered in my medical decision making (see chart for details).     Poison ivy dermatitis Mild intermittent asthma with acute exacerbation #Allergic rhinitis Patient is 54 year old presenting with poison ivy dermatitis a mild asthma exacerbation secondary to allergy likely.  Lung exam is overall reassuring, saturating very well with normal  respiratory rate.  No crackles and lower extremity edema, likely this is more related to her asthma as opposed to her high blood pressure today.  Given numerous locations of poison ivy dermatitis, and likely mild asthma exacerbation will treat with prednisone.  Hydroxyzine and Zyrtec for itch.  Discussed side effects of hydroxyzine.  We will have her follow-up with primary care to discuss and reevaluate her asthma, strict emergency department and return precautions were discussed.  Patient verbalized understanding. Final Clinical Impressions(s) / UC Diagnoses   Final diagnoses:  Poison ivy dermatitis  Mild intermittent asthma with acute exacerbation  Allergic rhinitis, unspecified seasonality, unspecified trigger     Discharge Instructions     Take the prednisone for the next 5 days Use the hydroxyzine for itch, this may make you sleepy so primarily use this at night, do not drive after taking this.   Take the Zyrtec daily Use calamine lotion as needed on the rash  You have worsening shortness of breath, develop chest pain, nausea or lightheaded feeling please go to the emergency department for further evaluation.  Schedule follow-up with your primary care to  discuss your asthma      ED Prescriptions    Medication Sig Dispense Auth. Provider   predniSONE (DELTASONE) 20 MG tablet Take 2 tablets (40 mg total) by mouth daily with breakfast for 5 days. 10 tablet Erico Stan, Marguerita Beards, PA-C   hydrOXYzine (ATARAX/VISTARIL) 25 MG tablet Take 1 tablet (25 mg total) by mouth every 8 (eight) hours as needed. 12 tablet Kristiana Jacko, Marguerita Beards, PA-C   calamine lotion Apply 1 application topically as needed for itching. 120 mL Azizah Lisle, Marguerita Beards, PA-C   cetirizine (ZYRTEC ALLERGY) 10 MG tablet Take 1 tablet (10 mg total) by mouth daily. 30 tablet Rydan Gulyas, Marguerita Beards, PA-C     PDMP not reviewed this encounter.   Purnell Shoemaker, PA-C 03/18/20 1236    Theressa Piedra, Marguerita Beards, PA-C 03/18/20 1237

## 2020-04-06 ENCOUNTER — Other Ambulatory Visit: Payer: Self-pay | Admitting: Student in an Organized Health Care Education/Training Program

## 2020-06-12 ENCOUNTER — Ambulatory Visit: Payer: Self-pay

## 2020-06-17 ENCOUNTER — Other Ambulatory Visit: Payer: Self-pay

## 2020-06-17 ENCOUNTER — Encounter: Payer: Self-pay | Admitting: Family Medicine

## 2020-06-17 ENCOUNTER — Ambulatory Visit (INDEPENDENT_AMBULATORY_CARE_PROVIDER_SITE_OTHER): Payer: Self-pay | Admitting: Family Medicine

## 2020-06-17 VITALS — BP 152/82 | HR 78 | Wt 147.6 lb

## 2020-06-17 DIAGNOSIS — B309 Viral conjunctivitis, unspecified: Secondary | ICD-10-CM

## 2020-06-17 MED ORDER — OLOPATADINE HCL 0.1 % OP SOLN: 1.0000 [drp] | Freq: Two times a day (BID) | OPHTHALMIC | 0 refills | Status: DC

## 2020-06-17 NOTE — Progress Notes (Signed)
    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 female in NAD HEENT: OS with conjunctival injection, no FB noted in eye, UL, or LL, watery discharge, no pain with EOM Lungs: No increased work of breathing on room air Skin: warm and dry   ASSESSMENT/PLAN:   Viral conjunctivitis of left eye Advised that conjunctivitis is likely viral in origin.  No purulent discharge.  Would hold off on antibiotics at this time.  Can use Pataday for hopeful improvement of irritation, can also use over-the-counter drops of normal saline for hydration.  Advised to avoid drops that decreased redness.  Return to care if no improvement over the next week, if develops purulent discharge or fever.  Patient voiced understanding.     Cleophas Dunker, [place]

## 2020-06-17 NOTE — Patient Instructions (Signed)
Thank you for coming to see me today. It was a pleasure. Today we talked about:   You likely have viral conjunctivitis.  This will heal without antibiotics.  I have prescribed a medication that will hopefully help with the irritation.  You can use this twice a day.  You can also pick up over-the-counter normal saline drops to use in your eye to keep it moisturized.  If you have worsening of symptoms, your symptoms do not get better over the next week, restart to develop fever or drainage from the eyes that is purulent, please come back.  Please follow-up with PCP in 1 week if no improvement.  If you have any questions or concerns, please do not hesitate to call the office at [phone].  Best,   [place] Constable, DO

## 2020-06-17 NOTE — Assessment & Plan Note (Signed)
Advised that conjunctivitis is likely viral in origin.  No purulent discharge.  Would hold off on antibiotics at this time.  Can use Pataday for hopeful improvement of irritation, can also use over-the-counter drops of normal saline for hydration.  Advised to avoid drops that decreased redness.  Return to care if no improvement over the next week, if develops purulent discharge or fever.  Patient voiced understanding.

## 2020-08-29 ENCOUNTER — Encounter (HOSPITAL_COMMUNITY): Payer: Self-pay | Admitting: Emergency Medicine

## 2020-08-29 ENCOUNTER — Ambulatory Visit (HOSPITAL_COMMUNITY)
Admission: EM | Admit: 2020-08-29 | Discharge: 2020-08-29 | Disposition: A | Discharge status: Home / Self Care | Payer: BLUE CROSS/BLUE SHIELD | Attending: Urgent Care | Admitting: Urgent Care

## 2020-08-29 DIAGNOSIS — H5711 Ocular pain, right eye: Secondary | ICD-10-CM

## 2020-08-29 DIAGNOSIS — H1033 Unspecified acute conjunctivitis, bilateral: Secondary | ICD-10-CM

## 2020-08-29 MED ORDER — AZELASTINE HCL 0.05 % OP SOLN: 1.0000 [drp] | Freq: Two times a day (BID) | OPHTHALMIC | 0 refills | Status: DC

## 2020-08-29 MED ORDER — POLYMYXIN B-TRIMETHOPRIM 10000-0.1 UNIT/ML-% OP SOLN: 1.0000 [drp] | OPHTHALMIC | 0 refills | Status: DC

## 2020-08-29 NOTE — ED Provider Notes (Signed)
[facility]   MRN: [id number] DOB: [birth year]  Subjective:   Sara Day is a 54 y.o. female presenting for acute onset of 2-day history right eye stinging, itching now affecting her left eye.  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  Denies vision change, does not wear contacts.  Denies eye trauma.  Denies runny or stuffy nose, sore throat, cough.  No current facility-administered medications for this encounter.  Current Outpatient Medications:  .  albuterol (VENTOLIN HFA) 108 (90 Base) MCG/ACT inhaler, INHALE 2 PUFFS INTO THE LUNGS EVERY 4 HOURS AS NEEDED FOR WHEEZING, Disp: 13.4 g, Rfl: 1 .  calamine lotion, Apply 1 application topically as needed for itching., Disp: 120 mL, Rfl: 0 .  cetirizine (ZYRTEC ALLERGY) 10 MG tablet, Take 1 tablet (10 mg total) by mouth daily., Disp: 30 tablet, Rfl: 0 .  hydrochlorothiazide (HYDRODIURIL) 12.5 MG tablet, Take 1 tablet (12.5 mg total) by mouth daily., Disp: 90 tablet, Rfl: 1 .  hydrOXYzine (ATARAX/VISTARIL) 25 MG tablet, Take 1 tablet (25 mg total) by mouth every 8 (eight) hours as needed., Disp: 12 tablet, Rfl: 0 .  olopatadine (PATADAY) 0.1 % ophthalmic solution, Place 1 drop into the left eye 2 (two) times daily., Disp: 5 mL, Rfl: 0   Allergies  Allergen Reactions  . Clindamycin/Lincomycin Swelling    Mouth and lip swelling   . Codeine Rash  . Metronidazole Rash  . Sulfa Antibiotics Rash  . Sulfamethoxazole Rash    Past Medical History:  Diagnosis Date  . Asthma   . Breast mass   . Hypertension      Past Surgical History:  Procedure Laterality Date  . BREAST EXCISIONAL BIOPSY      Family History  Problem Relation Age of Onset  . Heart attack Father   . Colon cancer Neg Hx   . Colon polyps Neg Hx   . Esophageal cancer Neg Hx   . Rectal cancer Neg Hx   . Stomach cancer Neg Hx     Social History   Tobacco Use  . Smoking status: Never  Smoker  . Smokeless tobacco: Never Used  Vaping Use  . Vaping Use: Never used  Substance Use Topics  . Alcohol use: Yes    Alcohol/week: 1.0 standard drink    Types: 1 Glasses of wine per week  . Drug use: No    ROS   Objective:   Vitals: BP (!) 172/94 (BP Location: Right Arm)   Pulse 62   Temp 98 F (36.7 C) (Oral)   Resp 15   SpO2 100%   BP Readings from Last 3 Encounters:  08/29/20 (!) 172/94  06/17/20 (!) 152/82  03/18/20 (!) 186/93   Physical Exam Constitutional:      General: She is not in acute distress.    Appearance: Normal appearance. She is well-developed. She is not ill-appearing, toxic-appearing or diaphoretic.  HENT:     Head: Normocephalic and atraumatic.     Nose: Nose normal.     Mouth/Throat:     Mouth: Mucous membranes are moist.     Pharynx: Oropharynx is clear.  Eyes:     General: Lids are everted, no foreign bodies appreciated. No scleral icterus.       Right eye: Discharge (clear) present. No foreign body or hordeolum.        Left eye: Discharge (clear) present.No foreign body or hordeolum.  Extraocular Movements: Extraocular movements intact.     Right eye: Normal extraocular motion.     Left eye: Normal extraocular motion.     Conjunctiva/sclera:     Right eye: Right conjunctiva is injected (R>L). No chemosis, exudate or hemorrhage.    Left eye: Left conjunctiva is injected. No chemosis, exudate or hemorrhage.    Pupils: Pupils are equal, round, and reactive to light.  Cardiovascular:     Rate and Rhythm: Normal rate.  Pulmonary:     Effort: Pulmonary effort is normal.  Skin:    General: Skin is warm and dry.  Neurological:     General: No focal deficit present.     Mental Status: She is alert and oriented to person, place, and time.     Cranial Nerves: No cranial nerve deficit.     Motor: No weakness.     Coordination: Coordination normal.     Gait: Gait normal.     Deep Tendon Reflexes: Reflexes normal.  Psychiatric:         Mood and Affect: Mood normal.        Behavior: Behavior normal.        Thought Content: Thought content normal.        Judgment: Judgment normal.     Assessment and Plan :   PDMP not reviewed this encounter.  1. Acute conjunctivitis of both eyes, unspecified acute conjunctivitis type   2. Acute right eye pain     Will manage with Polytrim for standard pink eye. Recommended using azelastine as needed for supportive care. Recheck with PCP asap regarding HTN. Neurologic exam unremarkable in clinic. Counseled patient on potential for adverse effects with medications prescribed/recommended today, strict ER and return-to-clinic precautions discussed, patient verbalized understanding.    Jaynee Eagles, PA-C 08/29/20 1842

## 2020-08-29 NOTE — ED Triage Notes (Signed)
Pt c/o possible conjunctivitis in the right eye x 2 days. She states it has been draining and has a lot of mucus and redness.

## 2020-09-27 ENCOUNTER — Other Ambulatory Visit: Payer: Self-pay | Admitting: Student in an Organized Health Care Education/Training Program

## 2020-11-05 ENCOUNTER — Ambulatory Visit (INDEPENDENT_AMBULATORY_CARE_PROVIDER_SITE_OTHER): Payer: BLUE CROSS/BLUE SHIELD | Admitting: Student in an Organized Health Care Education/Training Program

## 2020-11-05 ENCOUNTER — Encounter: Payer: Self-pay | Admitting: Student in an Organized Health Care Education/Training Program

## 2020-11-05 ENCOUNTER — Other Ambulatory Visit: Payer: Self-pay

## 2020-11-05 ENCOUNTER — Inpatient Hospital Stay (HOSPITAL_COMMUNITY): Admit: 2020-11-05 | Payer: BLUE CROSS/BLUE SHIELD

## 2020-11-05 VITALS — BP 143/80 | HR 86 | Ht 61.0 in | Wt 149.2 lb

## 2020-11-05 DIAGNOSIS — Z0000 Encounter for general adult medical examination without abnormal findings: Secondary | ICD-10-CM

## 2020-11-05 DIAGNOSIS — Z1231 Encounter for screening mammogram for malignant neoplasm of breast: Secondary | ICD-10-CM | POA: Diagnosis not present

## 2020-11-05 DIAGNOSIS — E7800 Pure hypercholesterolemia, unspecified: Secondary | ICD-10-CM | POA: Diagnosis not present

## 2020-11-05 DIAGNOSIS — N898 Other specified noninflammatory disorders of vagina: Secondary | ICD-10-CM

## 2020-11-05 DIAGNOSIS — I10 Essential (primary) hypertension: Secondary | ICD-10-CM

## 2020-11-05 DIAGNOSIS — J45909 Unspecified asthma, uncomplicated: Secondary | ICD-10-CM

## 2020-11-05 DIAGNOSIS — Z780 Asymptomatic menopausal state: Secondary | ICD-10-CM

## 2020-11-05 LAB — POCT WET PREP (WET MOUNT)
Clue Cells Wet Prep Whiff POC: POSITIVE
Trichomonas Wet Prep HPF POC: ABSENT

## 2020-11-05 MED ORDER — AMLODIPINE BESYLATE 5 MG PO TABS: 5.0000 mg | ORAL_TABLET | Freq: Every day | ORAL | 0 refills | Status: DC

## 2020-11-05 NOTE — Patient Instructions (Addendum)
It was a pleasure to see you today!  To summarize our discussion for this visit:  We completed a pap smear today  I ordered a mammogram  Please return in a couple weeks for a COVID booster shot with nurse visit or can get at local pharmacy  Your would like to try diet control for your cholesterol. We can check again in about 6 months.  Your blood pressure remains elevated so I would like you to continue to take blood pressure medication. I will prescribe a new medication and you need to take it EVERY day.  Some additional health maintenance measures we should update are: Health Maintenance Due  Topic Date Due  . COVID-19 Vaccine (1) Never done  . MAMMOGRAM  01/27/2019  . INFLUENZA VACCINE  05/19/2020  . PAP SMEAR-Modifier  07/08/2020  .    Please return to our clinic to see me in 3 months or sooner if needed.  Call the clinic at [phone] if your symptoms worsen or you have any concerns.   Thank you for allowing me to take part in your care,  Dr. Doristine Mango   Fat and Cholesterol Restricted Eating Plan Getting too much fat and cholesterol in your diet may cause health problems. Choosing the right foods helps keep your fat and cholesterol at normal levels. This can keep you from getting certain diseases. Your doctor may recommend an eating plan that includes:  Total fat: ______% or less of total calories a day.  Saturated fat: ______% or less of total calories a day.  Cholesterol: less than _________mg a day.  Fiber: ______g a day. What are tips for following this plan? Meal planning  At meals, divide your plate into four equal parts: ? Fill one-half of your plate with vegetables and green salads. ? Fill one-fourth of your plate with whole grains. ? Fill one-fourth of your plate with low-fat (lean) protein foods.  Eat fish that is high in omega-3 fats at least two times a week. This includes mackerel, tuna, sardines, and salmon.  Eat foods that are high in  fiber, such as whole grains, beans, apples, broccoli, carrots, peas, and barley. General tips  Work with your doctor to lose weight if you need to.  Avoid: ? Foods with added sugar. ? Fried foods. ? Foods with partially hydrogenated oils.  Limit alcohol intake to no more than 1 drink a day for nonpregnant women and 2 drinks a day for men. One drink equals 12 oz of beer, 5 oz of wine, or 1 oz of hard liquor.   Reading food labels  Check food labels for: ? Trans fats. ? Partially hydrogenated oils. ? Saturated fat (g) in each serving. ? Cholesterol (mg) in each serving. ? Fiber (g) in each serving.  Choose foods with healthy fats, such as: ? Monounsaturated fats. ? Polyunsaturated fats. ? Omega-3 fats.  Choose grain products that have whole grains. Look for the word "whole" as the first word in the ingredient list. Cooking  Cook foods using low-fat methods. These include baking, boiling, grilling, and broiling.  Eat more home-cooked foods. Eat at restaurants and buffets less often.  Avoid cooking using saturated fats, such as butter, cream, palm oil, palm kernel oil, and coconut oil. Recommended foods Fruits  All fresh, canned (in natural juice), or frozen fruits. Vegetables  Fresh or frozen vegetables (raw, steamed, roasted, or grilled). Green salads. Grains  Whole grains, such as whole wheat or whole grain breads, crackers, cereals, and pasta. Unsweetened  oatmeal, bulgur, barley, quinoa, or brown rice. Corn or whole wheat flour tortillas. Meats and other protein foods  Ground beef (85% or leaner), grass-fed beef, or beef trimmed of fat. Skinless chicken or [place]. Ground chicken or [place]. Pork trimmed of fat. All fish and seafood. Egg whites. Dried beans, peas, or lentils. Unsalted nuts or seeds. Unsalted canned beans. Nut butters without added sugar or oil. Dairy  Low-fat or nonfat dairy products, such as skim or 1% milk, 2% or reduced-fat cheeses, low-fat and  fat-free ricotta or cottage cheese, or plain low-fat and nonfat yogurt. Fats and oils  Tub margarine without trans fats. Light or reduced-fat mayonnaise and salad dressings. Avocado. Olive, canola, sesame, or safflower oils. The items listed above may not be a complete list of foods and beverages you can eat. Contact a dietitian for more information.   Foods to avoid Fruits  Canned fruit in heavy syrup. Fruit in cream or butter sauce. Fried fruit. Vegetables  Vegetables cooked in cheese, cream, or butter sauce. Fried vegetables. Grains  White bread. White pasta. White rice. Cornbread. Bagels, pastries, and croissants. Crackers and snack foods that contain trans fat and hydrogenated oils. Meats and other protein foods  Fatty cuts of meat. Ribs, chicken wings, bacon, sausage, bologna, salami, chitterlings, fatback, hot dogs, bratwurst, and packaged lunch meats. Liver and organ meats. Whole eggs and egg yolks. Chicken and [place] with skin. Fried meat. Dairy  Whole or 2% milk, cream, half-and-half, and cream cheese. Whole milk cheeses. Whole-fat or sweetened yogurt. Full-fat cheeses. Nondairy creamers and whipped toppings. Processed cheese, cheese spreads, and cheese curds. Beverages  Alcohol. Sugar-sweetened drinks such as sodas, lemonade, and fruit drinks. Fats and oils  Butter, stick margarine, lard, shortening, ghee, or bacon fat. Coconut, palm kernel, and palm oils. Sweets and desserts  Corn syrup, sugars, honey, and molasses. Candy. Jam and jelly. Syrup. Sweetened cereals. Cookies, pies, cakes, donuts, muffins, and ice cream. The items listed above may not be a complete list of foods and beverages you should avoid. Contact a dietitian for more information. Summary  Choosing the right foods helps keep your fat and cholesterol at normal levels. This can keep you from getting certain diseases.  At meals, fill one-half of your plate with vegetables and green salads.  Eat high-fiber  foods, like whole grains, beans, apples, carrots, peas, and barley.  Limit added sugar, saturated fats, alcohol, and fried foods. This information is not intended to replace advice given to you by your health care provider. Make sure you discuss any questions you have with your health care provider. Document Revised: 02/07/2020 Document Reviewed: 02/07/2020 Elsevier Patient Education  2021 [employer].

## 2020-11-05 NOTE — Progress Notes (Signed)
    SUBJECTIVE:   CHIEF COMPLAINT / HPI: physical  Requesting COVID booster but not available in our clinic today. Patient was called and informed prior to her appointment. Mammogram due, and ordered.  HTN-asymptomatic, poorly controlled.  Nonadherent with prescribed HCTZ.  Does not monitor at home.  HLD-discussed indication for treating hyperlipidemia and etiology.  Patient states that her boyfriend is telling her how to eat healthy and she wants to decrease her salt intake to improve her cholesterol.  Menopause-patient has had intermittent and irregular menstrual cycles for the past 6 months or so.  Albuterol- was using it almost every day and last used a couple weeks ago. Normally takes it when the weather changes or if she get's too hot. Never diagnosed with asthma and doesn't know why it was prescribed.    PERTINENT  PMH / PSH: normal pap 05/2017, last mammogram 2018 showed benign sebaceous cyst with no malignancy  OBJECTIVE:   BP (!) 143/80   Pulse 86   Ht 5\' 1"  (1.549 m)   Wt 149 lb 3.2 oz (67.7 kg)   SpO2 100%   BMI 28.19 kg/m   Provider rechecked BP ~140/80 Chaperone present for duration of sensitive exam. General: NAD, pleasant, able to participate in exam Cardiac: RRR, normal heart sounds, no murmurs. 2+ radial and PT pulses bilaterally Respiratory: CTAB, normal effort, No wheezes, rales or rhonchi Pelvic: Normal external genitalia.  Cervix without lesions.  No erythema, abnormal discharge.  No tenderness to exam. Extremities: no edema. WWP. Skin: warm and dry, no rashes noted Neuro: alert and oriented, no focal deficits Psych: Normal affect and mood  ASSESSMENT/PLAN:   Essential hypertension Patient nonadherent with blood pressure treatment. Since patient continues to have elevated blood pressures well off medication I again recommended she take daily blood pressure controlling medication. Switching to amlodipine 5 at this visit to cut back on electrolyte  abnormality concerns for poor follow-up. Could also consider chlorthalidone as this has a longer half-life and patient seems to take the medication approximately every 2 to 3 days. -Recheck in 1 month  Healthcare maintenance Patient has history of abnormal Pap smears but most recent was normal and is due for repeat today. Pap smear taken today negative for abnormalities and cervix looked normal on exam despite history of abnormal appearance. -Ordered mammogram.  Patient has history of benign cyst.  Provided patient with location and contact information for setting up that appointment.  HYPERCHOLESTEROLEMIA Discussed lipid panel with patient.  She is choosing to have a period of observation with diet changes.  Educated patient on lifestyle changes that can impact cholesterol and that is likely at least partially genetic. Recheck in 6 months  Asthma Patient denies diagnosis of asthma.  Describes inappropriate use of albuterol. Discussed with patient that I will not be refilling this medication. Can consider PFTs if symptomatic.  Menopause Follow-up with OB/gyn  Vaginal discharge Wet prep positive for clue cells and patient has described increased vaginal discharge.  She declined STD screening as she has no concerns for infection at this time. -Treatment sent to pharmacy.  Metronidazole and clindamycin intolerance     Richarda Osmond, [place]

## 2020-11-07 LAB — CYTOLOGY - PAP
Comment: NEGATIVE
Diagnosis: NEGATIVE
High risk HPV: NEGATIVE

## 2020-11-08 MED ORDER — TINIDAZOLE 500 MG PO TABS: 1.0000 g | ORAL_TABLET | Freq: Every day | ORAL | 0 refills | Status: AC

## 2020-11-08 NOTE — Assessment & Plan Note (Addendum)
Patient has history of abnormal Pap smears but most recent was normal and is due for repeat today. Pap smear taken today negative for abnormalities and cervix looked normal on exam despite history of abnormal appearance. -Ordered mammogram.  Patient has history of benign cyst.  Provided patient with location and contact information for setting up that appointment.

## 2020-11-08 NOTE — Assessment & Plan Note (Signed)
Follow up with OB/gyn °

## 2020-11-08 NOTE — Assessment & Plan Note (Addendum)
Wet prep positive for clue cells and patient has described increased vaginal discharge.  She declined STD screening as she has no concerns for infection at this time. -Treatment sent to pharmacy.  Metronidazole and clindamycin intolerance

## 2020-11-08 NOTE — Assessment & Plan Note (Signed)
Patient denies diagnosis of asthma.  Describes inappropriate use of albuterol. Discussed with patient that I will not be refilling this medication. Can consider PFTs if symptomatic.

## 2020-11-08 NOTE — Assessment & Plan Note (Signed)
Discussed lipid panel with patient.  She is choosing to have a period of observation with diet changes.  Educated patient on lifestyle changes that can impact cholesterol and that is likely at least partially genetic. Recheck in 6 months

## 2020-11-08 NOTE — Assessment & Plan Note (Signed)
Patient nonadherent with blood pressure treatment. Since patient continues to have elevated blood pressures well off medication I again recommended she take daily blood pressure controlling medication. Switching to amlodipine 5 at this visit to cut back on electrolyte abnormality concerns for poor follow-up. Could also consider chlorthalidone as this has a longer half-life and patient seems to take the medication approximately every 2 to 3 days. -Recheck in 1 month

## 2020-11-13 ENCOUNTER — Other Ambulatory Visit: Payer: Self-pay

## 2020-11-13 ENCOUNTER — Ambulatory Visit (INDEPENDENT_AMBULATORY_CARE_PROVIDER_SITE_OTHER): Payer: BLUE CROSS/BLUE SHIELD

## 2020-11-13 DIAGNOSIS — Z23 Encounter for immunization: Secondary | ICD-10-CM

## 2020-11-13 NOTE — Progress Notes (Signed)
   Covid-19 Vaccination Clinic  Name:  Sara Day    MRN: [id number] DOB: Jan 19, 1966  11/13/2020  Sara Day was observed post Covid-19 immunization for 15 minutes without incident. She was provided with Vaccine Information Sheet and instruction to access the V-Safe system.   Sara Day was instructed to call 911 with any severe reactions post vaccine: Marland Kitchen Difficulty breathing  . Swelling of face and throat  . A fast heartbeat  . A bad rash all over body  . Dizziness and weakness   Booster administered RD without complication.

## 2020-11-22 ENCOUNTER — Telehealth: Payer: Self-pay | Admitting: Student in an Organized Health Care Education/Training Program

## 2020-11-22 NOTE — Telephone Encounter (Signed)
Patient is calling to get a replacement COVID card and she also misplaced the form to go get a mammogram. Please advise. Thanks!

## 2020-11-25 NOTE — Telephone Encounter (Signed)
If we can verify it via [facility] then yes. Christen Bame, CMA

## 2020-11-25 NOTE — Telephone Encounter (Signed)
Are we allowed to give replacement cards if they didn't get the vaccine here? Please advise. Vinicius Brockman Kennon Holter, CMA

## 2021-01-23 ENCOUNTER — Other Ambulatory Visit: Payer: Self-pay

## 2021-01-23 ENCOUNTER — Ambulatory Visit (INDEPENDENT_AMBULATORY_CARE_PROVIDER_SITE_OTHER): Payer: BLUE CROSS/BLUE SHIELD | Admitting: Student in an Organized Health Care Education/Training Program

## 2021-01-23 ENCOUNTER — Encounter: Payer: Self-pay | Admitting: Student in an Organized Health Care Education/Training Program

## 2021-01-23 DIAGNOSIS — I10 Essential (primary) hypertension: Secondary | ICD-10-CM

## 2021-01-23 MED ORDER — LORATADINE 10 MG PO TABS: 10.0000 mg | ORAL_TABLET | Freq: Every day | ORAL | 11 refills | Status: DC

## 2021-01-23 NOTE — Progress Notes (Signed)
   SUBJECTIVE:   CHIEF COMPLAINT / HPI: BP f/u  HTN- patient has been intermittently taking her old HCTZ including approximately 5 minutes before her visit today-  Has not picked up amlodipine that was prescribed at her last physical. She was confused and did not recall our conversation about the new medication.  Endorsed having a BP cuff at home.  I asked her if the cuff was a wrist or upper arm type and the patient looked confused and thought for a couple of seconds and then said that it was an upper arm cuff.  I asked her if she takes her blood pressure at home and she says that she does periodically.  She is unable to recall what her systolic numbers are but thinks that the bottom number is in the 40s at home. Denies symptoms of hypertension or hypotension.  Wants refill of albuterol.  Patient states that she has "respiratory symptoms" in the springtime which is improved with albuterol use.  She has been on an unknown allergy pill in the past which she thinks starts with a T but has not taken it for several years.  This also helped with her symptoms.  Spring time symptoms. Prefers pills for allergies. Pulmonary testing  Mammogram-patient did not complete her mammogram as ordered at her last visit and requests a new referral.  OBJECTIVE:   BP (!) 152/60   Pulse 62   Ht 5\' 1"  (1.549 m)   Wt 153 lb (69.4 kg)   LMP 12/23/2020 (Approximate)   SpO2 98%   BMI 28.91 kg/m   Physical Exam Vitals and nursing note reviewed.  Constitutional:      General: She is not in acute distress.    Appearance: She is obese. She is not ill-appearing, toxic-appearing or diaphoretic.  HENT:     Head: Normocephalic.     Nose: Nose normal.  Eyes:     Conjunctiva/sclera: Conjunctivae normal.  Cardiovascular:     Rate and Rhythm: Normal rate and regular rhythm.  Pulmonary:     Effort: Pulmonary effort is normal.     Breath sounds: Normal breath sounds.  Neurological:     Mental Status: She is alert.  Mental status is at baseline.  Psychiatric:        Mood and Affect: Mood normal.    ASSESSMENT/PLAN:   Essential hypertension Poorly controlled and asymptomatic.  Patient had confusion on medication regimen and was non adherent.  Discussed strategies to help her be successful in medication adherence such as setting alarm on her phone or placing near her toothbrush to help her remember but patient declined these options and stated that she just cannot take it from now on. Discussed in detail consequences of long-term poorly controlled blood pressure again. -Refilled amlodipine 5 mg -Take at home blood pressures and bring in diary at next visit -Follow-up in 2 weeks   Request pulmonary function tests with pharmacy team to address patient's concerns for springtime respiratory issues.  Also, prescribing Claritin.  Provided patient with contact information and directions to complete her mammogram again.   [place]

## 2021-01-23 NOTE — Assessment & Plan Note (Signed)
Poorly controlled and asymptomatic.  Patient had confusion on medication regimen and was non adherent.  Discussed strategies to help her be successful in medication adherence such as setting alarm on her phone or placing near her toothbrush to help her remember but patient declined these options and stated that she just cannot take it from now on. Discussed in detail consequences of long-term poorly controlled blood pressure again. -Refilled amlodipine 5 mg -Take at home blood pressures and bring in diary at next visit -Follow-up in 2 weeks

## 2021-01-23 NOTE — Patient Instructions (Signed)
It was a pleasure to see you today!  To summarize our discussion for this visit:  Today we clarified that your blood pressure medication is supposed to be AMLODIPINE 5mg . Please pick up from your pharmacy and take it today and every day.   Let's recheck your blood pressure in 2 weeks on the medication to see if we have the right dose or need to increase. You can take your blood pressure at home and write it down to show at that appointment to be very helpful in making medication decisions.   Please call to schedule your mammogram  I will reach out to our pharmacy team to complete asthma testing.  Some additional health maintenance measures we should update are: Health Maintenance Due  Topic Date Due  . MAMMOGRAM  01/27/2019  .    Call the clinic at [phone] if your symptoms worsen or you have any concerns.   Thank you for allowing me to take part in your care,  Dr. Doristine Mango   Hypertension, Adult Hypertension is another name for high blood pressure. High blood pressure forces your heart to work harder to pump blood. This can cause problems over time. There are two numbers in a blood pressure reading. There is a top number (systolic) over a bottom number (diastolic). It is best to have a blood pressure that is below 120/80. Healthy choices can help lower your blood pressure, or you may need medicine to help lower it. What are the causes? The cause of this condition is not known. Some conditions may be related to high blood pressure. What increases the risk?  Smoking.  Having type 2 diabetes mellitus, high cholesterol, or both.  Not getting enough exercise or physical activity.  Being overweight.  Having too much fat, sugar, calories, or salt (sodium) in your diet.  Drinking too much alcohol.  Having long-term (chronic) kidney disease.  Having a family history of high blood pressure.  Age. Risk increases with age.  Race. You may be at higher risk if you are  African American.  Gender. Men are at higher risk than women before age 9. After age 22, women are at higher risk than men.  Having obstructive sleep apnea.  Stress. What are the signs or symptoms?  High blood pressure may not cause symptoms. Very high blood pressure (hypertensive crisis) may cause: ? Headache. ? Feelings of worry or nervousness (anxiety). ? Shortness of breath. ? Nosebleed. ? A feeling of being sick to your stomach (nausea). ? Throwing up (vomiting). ? Changes in how you see. ? Very bad chest pain. ? Seizures. How is this treated?  This condition is treated by making healthy lifestyle changes, such as: ? Eating healthy foods. ? Exercising more. ? Drinking less alcohol.  Your health care provider may prescribe medicine if lifestyle changes are not enough to get your blood pressure under control, and if: ? Your top number is above 130. ? Your bottom number is above 80.  Your personal target blood pressure may vary. Follow these instructions at home: Eating and drinking  If told, follow the DASH eating plan. To follow this plan: ? Fill one half of your plate at each meal with fruits and vegetables. ? Fill one fourth of your plate at each meal with whole grains. Whole grains include whole-wheat pasta, brown rice, and whole-grain bread. ? Eat or drink low-fat dairy products, such as skim milk or low-fat yogurt. ? Fill one fourth of your plate at each meal with  low-fat (lean) proteins. Low-fat proteins include fish, chicken without skin, eggs, beans, and tofu. ? Avoid fatty meat, cured and processed meat, or chicken with skin. ? Avoid pre-made or processed food.  Eat less than 1,500 mg of salt each day.  Do not drink alcohol if: ? Your doctor tells you not to drink. ? You are pregnant, may be pregnant, or are planning to become pregnant.  If you drink alcohol: ? Limit how much you use to:  0-1 drink a day for women.  0-2 drinks a day for men. ? Be  aware of how much alcohol is in your drink. In the [place] one drink equals one 12 oz bottle of beer (355 mL), one 5 oz glass of wine (148 mL), or one 1 oz glass of hard liquor (44 mL).   Lifestyle  Work with your doctor to stay at a healthy weight or to lose weight. Ask your doctor what the best weight is for you.  Get at least 30 minutes of exercise most days of the week. This may include walking, swimming, or biking.  Get at least 30 minutes of exercise that strengthens your muscles (resistance exercise) at least 3 days a week. This may include lifting weights or doing Pilates.  Do not use any products that contain nicotine or tobacco, such as cigarettes, e-cigarettes, and chewing tobacco. If you need help quitting, ask your doctor.  Check your blood pressure at home as told by your doctor.  Keep all follow-up visits as told by your doctor. This is important.   Medicines  Take over-the-counter and prescription medicines only as told by your doctor. Follow directions carefully.  Do not skip doses of blood pressure medicine. The medicine does not work as well if you skip doses. Skipping doses also puts you at risk for problems.  Ask your doctor about side effects or reactions to medicines that you should watch for. Contact a doctor if you:  Think you are having a reaction to the medicine you are taking.  Have headaches that keep coming back (recurring).  Feel dizzy.  Have swelling in your ankles.  Have trouble with your vision. Get help right away if you:  Get a very bad headache.  Start to feel mixed up (confused).  Feel weak or numb.  Feel faint.  Have very bad pain in your: ? Chest. ? Belly (abdomen).  Throw up more than once.  Have trouble breathing. Summary  Hypertension is another name for high blood pressure.  High blood pressure forces your heart to work harder to pump blood.  For most people, a normal blood pressure is less than 120/80.  Making  healthy choices can help lower blood pressure. If your blood pressure does not get lower with healthy choices, you may need to take medicine. This information is not intended to replace advice given to you by your health care provider. Make sure you discuss any questions you have with your health care provider. Document Revised: 06/15/2018 Document Reviewed: 06/15/2018 Elsevier Patient Education  2021 [employer].

## 2021-04-17 ENCOUNTER — Ambulatory Visit
Admission: RE | Admit: 2021-04-17 | Discharge: 2021-04-17 | Disposition: A | Discharge status: Home / Self Care | Payer: BLUE CROSS/BLUE SHIELD | Source: Ambulatory Visit | Attending: Family Medicine | Admitting: Family Medicine

## 2021-04-17 ENCOUNTER — Other Ambulatory Visit: Payer: Self-pay

## 2021-04-17 DIAGNOSIS — Z1231 Encounter for screening mammogram for malignant neoplasm of breast: Secondary | ICD-10-CM

## 2021-05-14 ENCOUNTER — Other Ambulatory Visit: Payer: Self-pay | Admitting: Student in an Organized Health Care Education/Training Program

## 2021-05-14 DIAGNOSIS — I10 Essential (primary) hypertension: Secondary | ICD-10-CM

## 2021-05-15 NOTE — Telephone Encounter (Signed)
Patient calls nurse line to check status of BP medication refill. Informed patient that medication was denied and she needs a follow up appointment.   Patient scheduled for 8/17 with PCP. Please advise if partial refill can be sent to last patient until this appointment. Patient will run out of medication on Sunday.   Talbot Grumbling, RN

## 2021-05-16 ENCOUNTER — Other Ambulatory Visit: Payer: Self-pay | Admitting: Family Medicine

## 2021-05-16 DIAGNOSIS — I10 Essential (primary) hypertension: Secondary | ICD-10-CM

## 2021-05-16 MED ORDER — HYDROCHLOROTHIAZIDE 12.5 MG PO TABS: 12.5000 mg | ORAL_TABLET | Freq: Every day | ORAL | 0 refills | Status: DC

## 2021-05-16 NOTE — Addendum Note (Signed)
Addended by: Caralee Ates on: 05/16/2021 03:14 PM   Modules accepted: Orders

## 2021-05-16 NOTE — Telephone Encounter (Signed)
Rx refilled for 30 days.   Gerlene Fee, DO 05/16/2021, 3:14 PM PGY-3, [facility]

## 2021-05-18 ENCOUNTER — Other Ambulatory Visit: Payer: Self-pay

## 2021-05-18 ENCOUNTER — Ambulatory Visit (HOSPITAL_COMMUNITY)
Admission: EM | Admit: 2021-05-18 | Discharge: 2021-05-18 | Disposition: A | Discharge status: Home / Self Care | Payer: BLUE CROSS/BLUE SHIELD | Attending: Internal Medicine | Admitting: Internal Medicine

## 2021-05-18 ENCOUNTER — Encounter (HOSPITAL_COMMUNITY): Payer: Self-pay

## 2021-05-18 DIAGNOSIS — L239 Allergic contact dermatitis, unspecified cause: Secondary | ICD-10-CM

## 2021-05-18 DIAGNOSIS — L509 Urticaria, unspecified: Secondary | ICD-10-CM | POA: Diagnosis not present

## 2021-05-18 DIAGNOSIS — R030 Elevated blood-pressure reading, without diagnosis of hypertension: Secondary | ICD-10-CM

## 2021-05-18 DIAGNOSIS — R21 Rash and other nonspecific skin eruption: Secondary | ICD-10-CM

## 2021-05-18 MED ORDER — TRIAMCINOLONE ACETONIDE 0.1 % EX CREA: 1.0000 "application " | TOPICAL_CREAM | Freq: Two times a day (BID) | CUTANEOUS | 0 refills | Status: DC

## 2021-05-18 NOTE — ED Triage Notes (Signed)
Pt present rash on her left arm an neck side. Pt states the area is itching and she noticed two days ago.

## 2021-05-18 NOTE — ED Provider Notes (Addendum)
[facility]    CSN: [id number] Arrival date & time: 05/18/21  1213      History   Chief Complaint Chief Complaint  Patient presents with   Rash    HPI Sara Day is a 55 y.o. female.   Patient presents to the urgent care with 2-day history of rash that is present to her neck and shoulder region.  Patient states that she noticed itching that started when she went to the hairdresser and a towel was placed around her neck.  Then, erythematous lesions popped up in those locations.  Denies any fevers.  Denies any other changes to lotions, soaps, detergents, foods, etc.  Patient also has elevated blood pressure reading in urgent care today.  Patient states that she has been out of her blood pressure medicine for approximately 1 week.  Denies any headaches, chest pain, shortness of breath, blurred vision.  Patient has prescription refill for blood pressure medicine currently at pharmacy awaiting pickup today.   Rash  Past Medical History:  Diagnosis Date   Asthma    Breast mass    Hypertension     Patient Active Problem List   Diagnosis Date Noted   Menopause 12/29/2019   Healthcare maintenance 07/08/2017   Vaginal discharge 07/05/2014   HYPERCHOLESTEROLEMIA 05/21/2009   Essential hypertension 05/21/2009   GERD 05/07/2008    Past Surgical History:  Procedure Laterality Date   BREAST EXCISIONAL BIOPSY      OB History   No obstetric history on file.      Home Medications    Prior to Admission medications   Medication Sig Start Date End Date Taking? Authorizing Provider  triamcinolone cream (KENALOG) 0.1 % Apply 1 application topically 2 (two) times daily. 05/18/21  Yes Odis Luster, FNP  albuterol (VENTOLIN HFA) 108 (90 Base) MCG/ACT inhaler INHALE 2 PUFFS INTO THE LUNGS EVERY 4 HOURS AS NEEDED FOR WHEEZING 04/08/20   Anderson, Chelsey L, DO  amLODipine (NORVASC) 5 MG tablet Take 1 tablet (5 mg total) by mouth at bedtime. 11/05/20   Anderson,  Chelsey L, DO  hydrochlorothiazide (HYDRODIURIL) 12.5 MG tablet Take 1 tablet (12.5 mg total) by mouth daily. 05/16/21   Autry-Lott, Naaman Plummer, DO  loratadine (CLARITIN) 10 MG tablet Take 1 tablet (10 mg total) by mouth daily. 01/23/21   Richarda Osmond, DO    Family History Family History  Problem Relation Age of Onset   Heart attack Father    Colon cancer Neg Hx    Colon polyps Neg Hx    Esophageal cancer Neg Hx    Rectal cancer Neg Hx    Stomach cancer Neg Hx     Social History Social History   Tobacco Use   Smoking status: Never   Smokeless tobacco: Never  Vaping Use   Vaping Use: Never used  Substance Use Topics   Alcohol use: Yes    Alcohol/week: 1.0 standard drink    Types: 1 Glasses of wine per week   Drug use: No     Allergies   Clindamycin/lincomycin, Codeine, Metronidazole, Sulfa antibiotics, and Sulfamethoxazole   Review of Systems Review of Systems  Skin:  Positive for rash.  Per HPI  Physical Exam Triage Vital Signs ED Triage Vitals  Enc Vitals Group     BP 05/18/21 1322 (!) 194/90     Pulse Rate 05/18/21 1322 (!) 55     Resp 05/18/21 1322 16     Temp --  Temp src --      SpO2 05/18/21 1322 100 %     Weight --      Height --      Head Circumference --      Peak Flow --      Pain Score 05/18/21 1321 0     Pain Loc --      Pain Edu? --      Excl. in [place]? --    No data found.  Updated Vital Signs BP (!) 172/74 (BP Location: Right Arm)   Pulse 60   Resp 16   SpO2 100%   Visual Acuity Right Eye Distance:   Left Eye Distance:   Bilateral Distance:    Right Eye Near:   Left Eye Near:    Bilateral Near:     Physical Exam Constitutional:      Appearance: Normal appearance.  HENT:     Head: Normocephalic and atraumatic.  Eyes:     Extraocular Movements: Extraocular movements intact.     Conjunctiva/sclera: Conjunctivae normal.  Cardiovascular:     Rate and Rhythm: Normal rate and regular rhythm.     Pulses: Normal pulses.      Heart sounds: Normal heart sounds.  Pulmonary:     Effort: Pulmonary effort is normal.     Breath sounds: Normal breath sounds.  Skin:    General: Skin is warm and dry.     Findings: Rash present. Rash is urticarial.     Comments: Erythematous, round single raised lesions present to right side of neck, left shoulder, right shoulder.   Neurological:     General: No focal deficit present.     Mental Status: She is alert and oriented to person, place, and time. Mental status is at baseline.     Cranial Nerves: Cranial nerves are intact.     Sensory: Sensation is intact.     Motor: Motor function is intact.     Coordination: Coordination is intact.     Gait: Gait is intact.  Psychiatric:        Mood and Affect: Mood normal.        Behavior: Behavior normal.        Thought Content: Thought content normal.        Judgment: Judgment normal.     UC Treatments / Results  Labs (all labs ordered are listed, but only abnormal results are displayed) Labs Reviewed - No data to display  EKG   Radiology No results found.  Procedures Procedures (including critical care time)  Medications Ordered in UC Medications - No data to display  Initial Impression / Assessment and Plan / UC Course  I have reviewed the triage vital signs and the nursing notes.  Pertinent labs & imaging results that were available during my care of the patient were reviewed by me and considered in my medical decision making (see chart for details).     Rash consistent with a contact dermatitis that is likely related to exposure at hair salon.  Will treat with triamcinolone cream.  Advised patient to follow-up with PCP or urgent care if rash does not improve or resolve in the next few days.  Second blood pressure had recovered slightly to [id number] systolic.  Patient is asymptomatic with hypertension, so no need for immediate medical attention at emergency department at this time.  Advised patient to pick up her blood  pressure medicine as soon as possible after leaving urgent care and take medication.  Patient  to monitor blood pressure at least twice daily at home and to go to the hospital with blood pressure continues to be elevated or if she develops any headache, chest pain, dizziness, shortness of breath.  Discussed strict return precautions. Patient verbalized understanding and is agreeable with plan.  Final Clinical Impressions(s) / UC Diagnoses   Final diagnoses:  Allergic contact dermatitis, unspecified trigger  Rash  Urticaria  Elevated blood pressure reading     Discharge Instructions      Please apply cream to affected areas of skin.  Please follow-up with primary care physician or urgent care if rash does not improve or resolve.     ED Prescriptions     Medication Sig Dispense Auth. Provider   triamcinolone cream (KENALOG) 0.1 % Apply 1 application topically 2 (two) times daily. 30 g Odis Luster, FNP      PDMP not reviewed this encounter.   Odis Luster, FNP 05/18/21 [place], [place], FNP 05/18/21 1420

## 2021-05-18 NOTE — Discharge Instructions (Addendum)
Please apply cream to affected areas of skin.  Please follow-up with primary care physician or urgent care if rash does not improve or resolve.

## 2021-06-02 NOTE — Progress Notes (Deleted)
    SUBJECTIVE:   CHIEF COMPLAINT / HPI:   Hypertension: - Medications: *** - Compliance: *** - Checking BP at home: *** - Denies any SOB, CP, vision changes, LE edema, medication SEs, or symptoms of hypotension - Diet: *** - Exercise: ***   PERTINENT  PMH / PSH: ***  OBJECTIVE:   There were no vitals taken for this visit.  ***  ASSESSMENT/PLAN:   No problem-specific Assessment & Plan notes found for this encounter.     Gerlene Fee, [place]

## 2021-06-04 ENCOUNTER — Ambulatory Visit: Payer: BLUE CROSS/BLUE SHIELD | Admitting: Family Medicine

## 2021-06-04 DIAGNOSIS — I10 Essential (primary) hypertension: Secondary | ICD-10-CM

## 2021-06-25 NOTE — Progress Notes (Signed)
    SUBJECTIVE:   CHIEF COMPLAINT / HPI:   [name] is a 55 yo F who presents for the below   Hypertension: - Medications: HCTZ 12.5 mg daily- out for 1 week or more; not taking amlodipine  - Compliance: Yes - Checking BP at home: Yes, every month - Denies any SOB, CP, vision changes, LE edema, medication SEs, or symptoms of hypotension  HM  Needs shingles vaccine Repeat lipid panel-discussed lifestyle changes prior to starting statin last year  PERTINENT  PMH / PSH: GERD, HLD  OBJECTIVE:   BP (!) 162/70   Pulse 70   Ht '5\' 1"'$  (1.549 m)   Wt 152 lb 3.2 oz (69 kg)   SpO2 98%   BMI 28.76 kg/m   Physical Exam Vitals reviewed.  Constitutional:      General: She is not in acute distress.    Appearance: She is not diaphoretic.  Cardiovascular:     Rate and Rhythm: Normal rate and regular rhythm.     Heart sounds: Normal heart sounds.  Pulmonary:     Effort: Pulmonary effort is normal.     Breath sounds: Normal breath sounds.  Musculoskeletal:     Right lower leg: No edema.     Left lower leg: No edema.  Neurological:     Mental Status: She is alert.   ASSESSMENT/PLAN:   Essential hypertension Poorly controlled. Asymptomatic. Out of HCTZ for a week or more. Not taking amlodipine as was previously discussed by Dr. Ouida Sills. Stressed the importance of controlled blood pressure. Patient voiced understanding.  Plan to refill HCTZ 12.5 mg and have patient follow-up for BP check in a week.  ED precautions given.  HYPERCHOLESTEROLEMIA - Recheck lipid panel at 1 week follow-up; Future order placed.  Need for vaccination - Zoster Vaccine Adjuvanted [facility]) injection; Inject 0.5 mLs into the muscle once for 1 dose.  Dispense: 0.5 mL; Refill: 0   Gerlene Fee, DO [place]

## 2021-06-26 ENCOUNTER — Ambulatory Visit (INDEPENDENT_AMBULATORY_CARE_PROVIDER_SITE_OTHER): Payer: BLUE CROSS/BLUE SHIELD | Admitting: Family Medicine

## 2021-06-26 ENCOUNTER — Other Ambulatory Visit: Payer: Self-pay

## 2021-06-26 ENCOUNTER — Encounter: Payer: Self-pay | Admitting: Family Medicine

## 2021-06-26 VITALS — BP 185/87 | HR 70 | Ht 61.0 in | Wt 152.2 lb

## 2021-06-26 DIAGNOSIS — E7800 Pure hypercholesterolemia, unspecified: Secondary | ICD-10-CM

## 2021-06-26 DIAGNOSIS — Z23 Encounter for immunization: Secondary | ICD-10-CM

## 2021-06-26 DIAGNOSIS — M79603 Pain in arm, unspecified: Secondary | ICD-10-CM

## 2021-06-26 DIAGNOSIS — I10 Essential (primary) hypertension: Secondary | ICD-10-CM | POA: Diagnosis not present

## 2021-06-26 MED ORDER — SHINGRIX 50 MCG/0.5ML IM SUSR: 0.5000 mL | Freq: Once | INTRAMUSCULAR | 0 refills | Status: AC

## 2021-06-26 MED ORDER — HYDROCHLOROTHIAZIDE 12.5 MG PO TABS: 12.5000 mg | ORAL_TABLET | Freq: Every day | ORAL | 2 refills | Status: DC

## 2021-06-26 NOTE — Patient Instructions (Addendum)
Thank you for coming in today.  It was nice to meet you. Today we discussed your blood pressure which was initially elevated and upon recheck it continued to be elevated because you have not taken your medication.  Take prescription to pharmacy to get shingles vaccine. Return in 1 week for BP check and cholesterol panel.   Dr. Janus Molder

## 2021-06-26 NOTE — Assessment & Plan Note (Signed)
-   Recheck lipid panel at 1 week follow-up; Future order placed.

## 2021-06-26 NOTE — Assessment & Plan Note (Signed)
Poorly controlled. Asymptomatic. Out of HCTZ for a week or more. Not taking amlodipine as was previously discussed by Dr. Ouida Sills. Stressed the importance of controlled blood pressure. Patient voiced understanding.  Plan to refill HCTZ 12.5 mg and have patient follow-up for BP check in a week.  ED precautions given.

## 2021-07-10 ENCOUNTER — Other Ambulatory Visit: Payer: Self-pay

## 2021-07-10 ENCOUNTER — Ambulatory Visit (HOSPITAL_COMMUNITY)
Admission: EM | Admit: 2021-07-10 | Discharge: 2021-07-10 | Disposition: A | Discharge status: Home / Self Care | Payer: BLUE CROSS/BLUE SHIELD | Attending: Emergency Medicine | Admitting: Emergency Medicine

## 2021-07-10 ENCOUNTER — Encounter (HOSPITAL_COMMUNITY): Payer: Self-pay | Admitting: *Deleted

## 2021-07-10 DIAGNOSIS — T148XXA Other injury of unspecified body region, initial encounter: Secondary | ICD-10-CM

## 2021-07-10 MED ORDER — CYCLOBENZAPRINE HCL 5 MG PO TABS: 5.0000 mg | ORAL_TABLET | Freq: Three times a day (TID) | ORAL | 0 refills | Status: DC | PRN

## 2021-07-10 MED ORDER — NAPROXEN 375 MG PO TABS: 375.0000 mg | ORAL_TABLET | Freq: Two times a day (BID) | ORAL | 0 refills | Status: DC

## 2021-07-10 MED ORDER — ALBUTEROL SULFATE HFA 108 (90 BASE) MCG/ACT IN AERS: INHALATION_SPRAY | RESPIRATORY_TRACT | 1 refills | Status: DC
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
## 2021-07-10 NOTE — Discharge Instructions (Signed)
You can use ice packs on your chest and neck for the next couple of days.  After that, starting Saturday, you can switch to heat or warm compresses.  Whether cold or hot, only use it for 15 to 20 minutes at a time, several times a day as needed.  You can take the muscle relaxer cyclobenzaprine up to 3 times a day, but not if you are going to drive or you need to work because it will make you sleepy.
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
## 2021-07-28 ENCOUNTER — Other Ambulatory Visit: Payer: Self-pay

## 2021-07-28 ENCOUNTER — Ambulatory Visit (INDEPENDENT_AMBULATORY_CARE_PROVIDER_SITE_OTHER): Payer: BLUE CROSS/BLUE SHIELD | Admitting: Family Medicine

## 2021-07-28 ENCOUNTER — Other Ambulatory Visit: Payer: Self-pay | Admitting: Family Medicine

## 2021-07-28 VITALS — BP 179/86 | HR 59 | Wt 152.2 lb

## 2021-07-28 DIAGNOSIS — Z09 Encounter for follow-up examination after completed treatment for conditions other than malignant neoplasm: Secondary | ICD-10-CM

## 2021-07-28 DIAGNOSIS — T148XXA Other injury of unspecified body region, initial encounter: Secondary | ICD-10-CM

## 2021-07-28 DIAGNOSIS — I10 Essential (primary) hypertension: Secondary | ICD-10-CM

## 2021-07-28 MED ORDER — MELOXICAM 15 MG PO TABS: 15.0000 mg | ORAL_TABLET | Freq: Every day | ORAL | 0 refills | Status: DC

## 2021-07-28 NOTE — Patient Instructions (Signed)
Thank you for coming today.  I am sorry that you are not feeling well. Today we discussed upper back muscle strain.  I will prescribe meloxicam instead of naproxen at this time.  Please continue with heat and cold compresses as well as IcyHot.  I have also referred you to physical therapy at your request. Today your blood pressure was elevated this could also be a reason for headaches.  Please take your blood pressure medication as prescribed daily for 2 weeks.  We will follow-up for another blood pressure recheck in 2 weeks and make medication adjustments then. If you have headache with nausea, confusion, vision changes, chest pain or numbness please go to the urgent care or ED.
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
## 2021-08-05 ENCOUNTER — Ambulatory Visit: Payer: BLUE CROSS/BLUE SHIELD | Attending: Family Medicine | Admitting: Physical Therapy

## 2021-08-05 ENCOUNTER — Other Ambulatory Visit: Payer: Self-pay

## 2021-08-05 DIAGNOSIS — M542 Cervicalgia: Secondary | ICD-10-CM | POA: Diagnosis not present

## 2021-08-05 DIAGNOSIS — R252 Cramp and spasm: Secondary | ICD-10-CM | POA: Insufficient documentation

## 2021-08-05 DIAGNOSIS — M6281 Muscle weakness (generalized): Secondary | ICD-10-CM | POA: Insufficient documentation

## 2021-08-05 DIAGNOSIS — T148XXA Other injury of unspecified body region, initial encounter: Secondary | ICD-10-CM | POA: Insufficient documentation

## 2021-08-05 DIAGNOSIS — M546 Pain in thoracic spine: Secondary | ICD-10-CM

## 2021-08-05 NOTE — Patient Instructions (Signed)
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
## 2021-08-11 ENCOUNTER — Other Ambulatory Visit: Payer: Self-pay

## 2021-08-11 ENCOUNTER — Encounter: Payer: Self-pay | Admitting: Family Medicine

## 2021-08-11 ENCOUNTER — Ambulatory Visit (INDEPENDENT_AMBULATORY_CARE_PROVIDER_SITE_OTHER): Payer: BLUE CROSS/BLUE SHIELD | Admitting: Family Medicine

## 2021-08-11 VITALS — BP 188/101 | HR 61 | Ht 61.0 in | Wt 151.0 lb

## 2021-08-11 DIAGNOSIS — I10 Essential (primary) hypertension: Secondary | ICD-10-CM | POA: Diagnosis not present

## 2021-08-11 DIAGNOSIS — R519 Headache, unspecified: Secondary | ICD-10-CM

## 2021-08-11 DIAGNOSIS — Z23 Encounter for immunization: Secondary | ICD-10-CM

## 2021-08-11 MED ORDER — HYDROCHLOROTHIAZIDE 25 MG PO TABS: 25.0000 mg | ORAL_TABLET | Freq: Every day | ORAL | 0 refills | Status: DC
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
## 2021-08-11 NOTE — Assessment & Plan Note (Signed)
Compliant with HCTZ 12.5 mg QD but not taking Norvasc. BP remained elevated. She started having mild headache during exam, even though, she denied headache initially. Increase HCTZ to 25 mg QD and hold Norvasc. F/U in 2 weeks for reassessment and may add Norvasc on if BP remains elevated. She will double up on her current dose of HCTZ. All questions were answered. ED precaution discussed.

## 2021-08-11 NOTE — Patient Instructions (Signed)
It was nice seeing you today. Your BP is elevated, please increase your HCTZ to 25 mg qd. Stop the Norvasc for now and see your PCP in 2 weeks.  I have placed referral for Head CT head.  Please call [facility] to schedule your appointment at [phone].  Form - Headache Record There are many types and causes of headaches. A headache record can help guide your treatment plan. Use this form to record the details. Bring this form with you to your follow-up visits. Follow your health care provider's instructions on how to describe your headache. You may be asked to: Use a pain scale. This is a tool to rate the intensity of your headache using words or numbers. Describe what your headache feels like, such as dull, achy, throbbing, or sharp. Headache record Date: _______________ Time (from start to end): ____________________ Location of the headache: _________________________ Intensity of the headache: ____________________ Description of the headache: ______________________________________________________________ Hours of sleep the night before the headache: __________ Food or drinks before the headache started: ______________________________________________________________________________________ Events before the headache started: _______________________________________________________________________________________________ Symptoms before the headache started: __________________________________________________________________________________________ Symptoms during the headache: __________________________________________________________________________________________________ Treatment: ________________________________________________________________________________________________________________ Effect of treatment: _________________________________________________________________________________________________________ Other comments:  ___________________________________________________________________________________________________________ Date: _______________ Time (from start to end): ____________________ Location of the headache: _________________________ Intensity of the headache: ____________________ Description of the headache: ______________________________________________________________ Hours of sleep the night before the headache: __________ Food or drinks before the headache started: ______________________________________________________________________________________ Events before the headache started: ____________________________________________________________________________________________ Symptoms before the headache started: _________________________________________________________________________________________ Symptoms during the headache: _______________________________________________________________________________________________ Treatment: ________________________________________________________________________________________________________________ Effect of treatment: _________________________________________________________________________________________________________ Other comments: ___________________________________________________________________________________________________________ Date: _______________ Time (from start to end): ____________________ Location of the headache: _________________________ Intensity of the headache: ____________________ Description of the headache: ______________________________________________________________ Hours of sleep the night before the headache: __________ Food or drinks before the headache started: ______________________________________________________________________________________ Events before the headache started: ____________________________________________________________________________________________ Symptoms before the headache started:  _________________________________________________________________________________________ Symptoms during the headache: _______________________________________________________________________________________________ Treatment: ________________________________________________________________________________________________________________ Effect of treatment: _________________________________________________________________________________________________________ Other comments: ___________________________________________________________________________________________________________ Date: _______________ Time (from start to end): ____________________ Location of the headache: _________________________ Intensity of the headache: ____________________ Description of the headache: ______________________________________________________________ Hours of sleep the night before the headache: _________ Food or drinks before the headache started: ______________________________________________________________________________________ Events before the headache started: ____________________________________________________________________________________________ Symptoms before the headache started: _________________________________________________________________________________________ Symptoms during the headache: _______________________________________________________________________________________________ Treatment: ________________________________________________________________________________________________________________ Effect of treatment: _________________________________________________________________________________________________________ Other comments: ___________________________________________________________________________________________________________ Date: _______________ Time (from start to end): ____________________ Location of the headache: _________________________ Intensity of  the headache: ____________________ Description of the headache: ______________________________________________________________ Hours of sleep the night before the headache: _________ Food or drinks before the headache started: ______________________________________________________________________________________ Events before the headache started: ____________________________________________________________________________________________ Symptoms before the headache started: _________________________________________________________________________________________ Symptoms during the headache: _______________________________________________________________________________________________ Treatment: ________________________________________________________________________________________________________________ Effect of treatment: _________________________________________________________________________________________________________ Other comments: ___________________________________________________________________________________________________________ This information is not intended to replace advice given to you by your health care provider. Make sure you discuss any questions you have with your health care provider. Document Revised: 10/24/2018 Document Reviewed: 10/24/2018 Elsevier Patient Education  [place].
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
## 2021-08-12 ENCOUNTER — Ambulatory Visit: Payer: BLUE CROSS/BLUE SHIELD | Admitting: Physical Therapy

## 2021-08-12 ENCOUNTER — Encounter: Payer: Self-pay | Admitting: Physical Therapy

## 2021-08-12 DIAGNOSIS — M546 Pain in thoracic spine: Secondary | ICD-10-CM

## 2021-08-12 DIAGNOSIS — M542 Cervicalgia: Secondary | ICD-10-CM

## 2021-08-12 DIAGNOSIS — R252 Cramp and spasm: Secondary | ICD-10-CM

## 2021-08-12 DIAGNOSIS — M6281 Muscle weakness (generalized): Secondary | ICD-10-CM

## 2021-08-12 NOTE — Therapy (Signed)
[place], [place], [address] Phone: [phone]   Fax:  [phone]  Physical Therapy Treatment  Patient Details  Name: Sara Day MRN: [id number] Date of Birth: 05/01/1966 Referring Provider (PT): Andrena Mews MD   Encounter Date: 08/12/2021   PT End of Session - 08/12/21 1111     Visit Number 2    Number of Visits 12    Date for PT Re-Evaluation 09/16/21    Authorization Type [employer] /[employer]    PT Start Time 1100    PT Stop Time 1150    PT Time Calculation (min) 50 min             Past Medical History:  Diagnosis Date   Asthma    Breast mass    Hypertension     Past Surgical History:  Procedure Laterality Date   BREAST EXCISIONAL BIOPSY      There were no vitals filed for this visit.   Subjective Assessment - 08/12/21 1108     Subjective The left side of my neck and shoulder are bothering me today. Exercises are going okay.    Currently in Pain? Yes    Pain Score 4     Pain Location Neck    Pain Orientation Left    Pain Descriptors / Indicators Tightness    Pain Type Acute pain    Aggravating Factors  hurts more at night,    Pain Relieving Factors pain meds                 [facility] Adult PT Treatment/Exercise - 08/12/21 0001       Self-Care   Self-Care Other Self-Care Comments    Other Self-Care Comments  self trigger point release with tennis ball to periscap      Neck Exercises: Seated   Neck Retraction 10 reps    Neck Retraction Limitations mod cues    Postural Training scap retract x 10 - mod cues      Neck Exercises: Stretches   Upper Trapezius Stretch 3 reps;10 seconds    Levator Stretch 3 reps;10 seconds      Modalities   Modalities Electrical Stimulation      Electrical Stimulation   Electrical Stimulation Location cervical- upper trap    Electrical Stimulation Action IFC    Electrical Stimulation Parameters 7 mA    Electrical Stimulation  Goals Pain                       PT Short Term Goals - 08/05/21 1223       PT SHORT TERM GOAL #1   Title "Independent with initial HEP    Time 2    Period Weeks    Status New    Target Date 08/19/21      PT SHORT TERM GOAL #2   Title Pt will be albe to walk and sit without gaurding trunk with bil arms due to decreased neck /upper back tension.  able to walk with arm swing    Baseline severe gaurding and not arm swing with walking    Time 2    Period Weeks    Status New    Target Date 08/19/21      PT SHORT TERM GOAL #3   Title Pt will be able to sit with good posture and no compensatory leaning     Baseline Pt leans back in chair for support of muscle spasms in  back    Time 2    Period Weeks    Status New    Target Date 08/19/21               PT Long Term Goals - 08/05/21 1126       PT LONG TERM GOAL #1   Title "Demonstrate and verbalize techniques to reduce the risk of re-injury including: lifting, posture, body mechanics.     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    Baseline eval 59%    Time 6    Status New    Target Date 09/16/21      PT LONG TERM GOAL #6   Title Pt will reduced headaches from every other day to one time a week    Baseline pt with headaches at least every other day impeding her ability to work without distraction    Time 6    Period Weeks    Status New    Target Date 09/16/21                   Plan - 08/12/21 1201     Clinical Impression Statement Sara Day reports increased pain after TPDN last session and declined any further TPDN. Reviewed HEP with mod cues and verbal cues to keep in a comfortable ROM. Pt is very guarded and has increased pain with all movements. Instructed pt in self trigger point release using tennis ball on wall which she was receptive to. Trial of IFC and HMP ti cervical and periscap which she reported helped reduce her pain at end of session. She did report that her right shoulder started aching while positioned for modalities.    PT Treatment/Interventions ADLs/Self Care Home Management;Cryotherapy;Electrical Stimulation;Iontophoresis 4mg /ml Dexamethasone;Moist Heat;Traction;Neuromuscular re-education;Therapeutic exercise;Therapeutic activities;Functional mobility training;Patient/family education;Manual techniques;Taping;Dry needling;Passive range of motion;Spinal Manipulations;Joint Manipulations    PT Next Visit Plan review HEP assess TPDN (did not like)   progress DNF strength as able . Manual as needed go over FOTO report visit 2 or 3; assess IFC response - give info  PT Home Exercise Plan [id number]             Patient will benefit from skilled therapeutic intervention in order to improve the following deficits and impairments:  Pain, Improper body mechanics, Postural dysfunction, Impaired UE functional use, Increased muscle spasms, Decreased activity tolerance, Decreased range of motion, Decreased  strength, Other (comment)  Visit Diagnosis: Cervicalgia  Pain in thoracic spine  Cramp and spasm  Muscle weakness (generalized)     Problem List Patient Active Problem List   Diagnosis Date Noted   Headache 08/11/2021   Menopause 12/29/2019   HYPERCHOLESTEROLEMIA 05/21/2009   Essential hypertension 05/21/2009   GERD 05/07/2008    Sara Day, Sara Day 08/12/2021, 12:10 PM  [facility] [address] [place], [place], [address] Phone: [phone]   Fax:  [phone]  Name: Sara Day MRN: [id number] Date of Birth: [birth year]

## 2021-08-14 ENCOUNTER — Encounter: Payer: Self-pay | Admitting: Physical Therapy

## 2021-08-14 ENCOUNTER — Other Ambulatory Visit: Payer: Self-pay

## 2021-08-14 ENCOUNTER — Ambulatory Visit: Payer: BLUE CROSS/BLUE SHIELD | Admitting: Physical Therapy

## 2021-08-14 DIAGNOSIS — M546 Pain in thoracic spine: Secondary | ICD-10-CM

## 2021-08-14 DIAGNOSIS — M6281 Muscle weakness (generalized): Secondary | ICD-10-CM

## 2021-08-14 DIAGNOSIS — R252 Cramp and spasm: Secondary | ICD-10-CM

## 2021-08-14 DIAGNOSIS — M542 Cervicalgia: Secondary | ICD-10-CM | POA: Diagnosis not present

## 2021-08-14 NOTE — Patient Instructions (Signed)

## 2021-08-14 NOTE — Therapy (Signed)
[place], [place], [address] Phone: [phone]   Fax:  [phone]  Physical Therapy Treatment  Patient Details  Name: Sara Day MRN: [id number] Date of Birth: [birth year] Referring Provider (PT): Andrena Mews MD   Encounter Date: 08/14/2021   PT End of Session - 08/14/21 1213     Visit Number 3    Number of Visits 12    Date for PT Re-Evaluation 09/16/21    Authorization Type Med Pay /BCBS    PT Start Time 1150    PT Stop Time 9381    PT Time Calculation (min) 45 min             Past Medical History:  Diagnosis Date   Asthma    Breast mass    Hypertension     Past Surgical History:  Procedure Laterality Date   BREAST EXCISIONAL BIOPSY      There were no vitals filed for this visit.   Subjective Assessment - 08/14/21 1154     Subjective I have tried the tennis ball on the wall and the exercises. I am still about the same. The heat and estim helped for awhile after last visit.    Currently in Pain? Yes    Pain Score 3     Pain Location Neck    Pain Orientation Left    Pain Descriptors / Indicators Tightness    Pain Type Acute pain    Aggravating Factors  hurting more at night, turning the steering wheel    Pain Relieving Factors pain meds                 [facility] Adult PT Treatment/Exercise - 08/14/21 0001       Self-Care   Self-Care Other Self-Care Comments    Other Self-Care Comments  self trigger point release with theracane to upper trap, TENS unit information and use      Neck Exercises: Seated   Neck Retraction 10 reps    Neck Retraction Limitations mod cues    Postural Training scap retract x 10 - mod cues    Other Seated Exercise cervical AROM rotation x 5 each      Neck Exercises: Stretches   Upper Trapezius Stretch 3 reps;10 seconds    Levator Stretch 3 reps;10 seconds      Shoulder Exercises: Supine   Horizontal ABduction 10 reps    Theraband  Level (Shoulder Horizontal ABduction) Level 2 (Red)    Horizontal ABduction Limitations 2 sets    Other Supine Exercises supine cervical retraction x 10      Electrical Stimulation   Electrical Stimulation Location cervical- upper trap    Electrical Stimulation Action IFC    Electrical Stimulation Parameters 9 mA    Electrical Stimulation Goals Pain                     PT Education - 08/14/21 1212     Education Details Theracane for TPR, TENS purchase options; FOTO score and predictions    Person(s) Educated Patient    Methods Explanation;Handout    Comprehension Verbalized understanding              PT Short Term Goals - 08/05/21 1223       PT SHORT TERM GOAL #1   Title "Independent with initial HEP    Time 2    Period Weeks    Status New    Target Date 08/19/21  PT SHORT TERM GOAL #2   Title Pt will be albe to walk and sit without gaurding trunk with bil arms due to decreased neck /upper back tension.  able to walk with arm swing    Baseline severe gaurding and not arm swing with walking    Time 2    Period Weeks    Status New    Target Date 08/19/21      PT SHORT TERM GOAL #3   Title Pt will be able to sit with good posture and no compensatory leaning     Baseline Pt leans back in chair for support of muscle spasms in back    Time 2    Period Weeks    Status New    Target Date 08/19/21               PT Long Term Goals - 08/05/21 1126       PT LONG TERM GOAL #1   Title "Demonstrate and verbalize techniques to reduce the risk of re-injury including: lifting, posture, body mechanics.     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    Baseline eval 59%    Time 6    Status New    Target Date 09/16/21      PT LONG TERM GOAL #6   Title Pt will reduced headaches from every other day to one time a week    Baseline pt with headaches at least every other day impeding her ability to work without distraction    Time 6    Period Weeks    Status New    Target Date 09/16/21                   Plan - 08/14/21 1235     Clinical Impression Statement Maryn reports Estim treatment was helpful last session and decreased her pain short term. Continued with gentle cervical and scapular mobility exercises with min increased pain. Cues for increased hold time as tolerated and to maintatin a comfortable ROM. ABle to begin supine cervical and  scap stab with good tolerance. Pt instructed in use of Theracane for upper trap TPR. She was given info on TENS and theracane purchase. Repeated IFC and heat and end of session to cervical spine. She reported decreased pain post session.    PT Treatment/Interventions ADLs/Self Care Home Management;Cryotherapy;Electrical Stimulation;Iontophoresis 4mg /ml Dexamethasone;Moist Heat;Traction;Neuromuscular re-education;Therapeutic exercise;Therapeutic activities;Functional mobility training;Patient/family education;Manual  techniques;Taping;Dry needling;Passive range of motion;Spinal Manipulations;Joint Manipulations    PT Next Visit Plan review HEP assess TPDN (did not like)   progress DNF strength as able . Manual as needed ; IFC PRN- did she look into purchasing TENS or theracane?    PT Home Exercise Plan [email or web address]             Patient will benefit from skilled therapeutic intervention in order to improve the following deficits and impairments:  Pain, Improper body mechanics, Postural dysfunction, Impaired UE functional use, Increased muscle spasms, Decreased activity tolerance, Decreased range of motion, Decreased strength, Other (comment)  Visit Diagnosis: Cervicalgia  Pain in thoracic spine  Cramp and spasm  Muscle weakness (generalized)     Problem List Patient Active Problem List   Diagnosis Date Noted   Headache 08/11/2021   Menopause 12/29/2019   HYPERCHOLESTEROLEMIA 05/21/2009   Essential hypertension 05/21/2009   GERD 05/07/2008    Dorene Ar, PTA 08/14/2021, 1:07 PM  [facility] [address] [place], [place], [address] Phone: [phone]   Fax:  [phone]  Name: PAIZLEE KINDER MRN: [id number] Date of Birth: Feb 18, 1966

## 2021-08-15 ENCOUNTER — Other Ambulatory Visit: Payer: BLUE CROSS/BLUE SHIELD

## 2021-08-20 ENCOUNTER — Other Ambulatory Visit: Payer: Self-pay

## 2021-08-20 ENCOUNTER — Ambulatory Visit: Payer: BLUE CROSS/BLUE SHIELD | Attending: Family Medicine | Admitting: Physical Therapy

## 2021-08-20 ENCOUNTER — Encounter: Payer: Self-pay | Admitting: Physical Therapy

## 2021-08-20 DIAGNOSIS — M546 Pain in thoracic spine: Secondary | ICD-10-CM | POA: Insufficient documentation

## 2021-08-20 DIAGNOSIS — M6281 Muscle weakness (generalized): Secondary | ICD-10-CM | POA: Diagnosis present

## 2021-08-20 DIAGNOSIS — M542 Cervicalgia: Secondary | ICD-10-CM | POA: Insufficient documentation

## 2021-08-20 DIAGNOSIS — R252 Cramp and spasm: Secondary | ICD-10-CM | POA: Insufficient documentation

## 2021-08-20 NOTE — Therapy (Signed)
[place], [place], [address] Phone: [phone]   Fax:  [phone]  Physical Therapy Treatment  Patient Details  Name: Sara Day MRN: [id number] Date of Birth: 09-Aug-1966 Referring Provider (PT): Andrena Mews MD   Encounter Date: 08/20/2021   PT End of Session - 08/20/21 1235     Visit Number 4    Number of Visits 12    Date for PT Re-Evaluation 09/16/21    Authorization Type Med Pay /BCBS    PT Start Time 1232    PT Stop Time 1315    PT Time Calculation (min) 43 min             Past Medical History:  Diagnosis Date   Asthma    Breast mass    Hypertension     Past Surgical History:  Procedure Laterality Date   BREAST EXCISIONAL BIOPSY      There were no vitals filed for this visit.   Subjective Assessment - 08/20/21 1234     Subjective Laying down still causes alot of pain and I have to take a muscle relaxer. I still have aching in back of left shoulder and up to neck. It is just worse at night.    Currently in Pain? Yes    Pain Score 5     Pain Location Neck    Pain Orientation Left    Pain Descriptors / Indicators Aching    Pain Type Acute pain    Aggravating Factors  hurts more at nigtht trying to sleep    Pain Relieving Factors pain meds                       [facility] Adult PT Treatment/Exercise - 08/20/21 0001       Self-Care   Other Self-Care Comments  Posture / alignment wih sleep positions in sidelying and hooklying- towel roll at  neck and pillow support for arm      Neck Exercises: Theraband   Rows 15 reps    Rows Limitations red      Neck Exercises: Stretches   Upper Trapezius Stretch 3 reps;10 seconds    Levator Stretch 3 reps;10 seconds      Shoulder Exercises: Supine   Horizontal ABduction 10 reps    Theraband Level (Shoulder Horizontal ABduction) Level 2 (Red)    Horizontal ABduction Limitations 2 sets    Diagonals 10 reps     Theraband Level (Shoulder Diagonals) Level 1 (Yellow)    Other Supine Exercises supine cervical retraction x 10      Modalities   Modalities Moist Heat      Moist Heat Therapy   Number Minutes Moist Heat 10 Minutes    Moist Heat Location Cervical      Electrical Stimulation   Electrical Stimulation Location cervical- upper trap    Electrical Stimulation Action IFC    Electrical Stimulation Parameters 9 mA    Electrical Stimulation Goals Pain                     PT Education - 08/20/21 1307     Education Details HEP    Person(s) Educated Patient    Methods Explanation;Handout    Comprehension Verbalized understanding              PT Short Term Goals - 08/20/21 1309       PT SHORT TERM GOAL #1   Title "Independent with  initial HEP    Time 2    Period Weeks    Status Achieved    Target Date 08/19/21      PT SHORT TERM GOAL #2   Title Pt will be albe to walk and sit without gaurding trunk with bil arms due to decreased neck /upper back tension.  able to walk with arm swing    Baseline severe gaurding and not arm swing with walking; 08/20/21- improving    Time 2    Period Weeks    Status On-going      PT SHORT TERM GOAL #3   Title Pt will be able to sit with good posture and no compensatory leaning     Baseline Pt leans back in chair for support of muscle spasms in back: 08/20/21- improving    Time 2    Period Weeks    Status On-going    Target Date 08/19/21               PT Long Term Goals - 08/05/21 1126       PT LONG TERM GOAL #1   Title "Demonstrate and verbalize techniques to reduce the risk of re-injury including: lifting, posture, body mechanics.     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    Baseline eval 59%    Time 6    Status New    Target Date 09/16/21      PT LONG TERM GOAL #6   Title Pt will reduced headaches from every other day to one time a week    Baseline pt with headaches at least every other day impeding her ability to work without distraction    Time 6    Period Weeks    Status New    Target Date 09/16/21                   Plan - 08/20/21 1307     Clinical Impression Statement Sara Day reports most pain at night which is helped with muscle relaxer. She has a constant ache in back  of left shoulder/scapula area. She has ordered a theracane for self TPR> Continued with scapular stabilization with good tolerance to supine therabands sp HEP was updated. She reported less achiness in shoulder after therex. Self care was provided for sleep positions in supine and sidelying to support neck and left shoulder. HMP and IFC repeated at end of session to further reduce pain.    PT Treatment/Interventions ADLs/Self Care Home Management;Cryotherapy;Electrical Stimulation;Iontophoresis 4mg /ml Dexamethasone;Moist Heat;Traction;Neuromuscular re-education;Therapeutic exercise;Therapeutic activities;Functional mobility training;Patient/family  education;Manual techniques;Taping;Dry needling;Passive range of motion;Spinal Manipulations;Joint Manipulations    PT Next Visit Plan assess response to new HEP;  assess TPDN (did not like);    progress DNF strength as able . Manual as needed ; IFC PRN- did she look into purchasing TENS or theracane? (yes ordered theracane )    PT Home Exercise Plan [email or web address]             Patient will benefit from skilled therapeutic intervention in order to improve the following deficits and impairments:  Pain, Improper body mechanics, Postural dysfunction, Impaired UE functional use, Increased muscle spasms, Decreased activity tolerance, Decreased range of motion, Decreased strength, Other (comment)  Visit Diagnosis: Cervicalgia  Pain in thoracic spine  Cramp and spasm     Problem List Patient Active Problem List   Diagnosis Date Noted   Headache 08/11/2021   Menopause 12/29/2019   HYPERCHOLESTEROLEMIA 05/21/2009   Essential hypertension 05/21/2009   GERD 05/07/2008    Dorene Ar, PTA 08/20/2021, 1:14 PM  [facility] [address] [place], [place], [address] Phone: [phone]   Fax:  [phone]  Name: Sara Day MRN: [id number] Date of Birth: December 21, 1965

## 2021-08-20 NOTE — Patient Instructions (Signed)
Access Code: [email or web address] URL: [email or web address] Date: 08/20/2021 Prepared by: Hessie Diener  Exercises Seated Levator Scapulae Stretch - 2-3 x daily - 7 x weekly - 1 sets - 3 reps - 30 sec hold Seated Cervical Sidebending Stretch - 2-3 x daily - 7 x weekly - 1 sets - 3 reps - 30 hold Seated Cervical Retraction - 2-3 x daily - 7 x weekly - 2 sets - 10 reps Seated Isometric Cervical Sidebending - 2-3 x daily - 7 x weekly - 2 sets - 10 reps Seated Isometric Cervical Extension - 2-3 x daily - 7 x weekly - 2 sets - 10 reps Supine Shoulder Horizontal Abduction with Resistance - 1 x daily - 7 x weekly - 1-2 sets - 10 reps Supine Shoulder External Rotation with Resistance - 1 x daily - 7 x weekly - 1-2 sets - 10 reps

## 2021-08-22 ENCOUNTER — Other Ambulatory Visit: Payer: Self-pay

## 2021-08-22 ENCOUNTER — Encounter: Payer: Self-pay | Admitting: Physical Therapy

## 2021-08-22 ENCOUNTER — Ambulatory Visit: Payer: BLUE CROSS/BLUE SHIELD | Admitting: Physical Therapy

## 2021-08-22 DIAGNOSIS — R252 Cramp and spasm: Secondary | ICD-10-CM

## 2021-08-22 DIAGNOSIS — M6281 Muscle weakness (generalized): Secondary | ICD-10-CM

## 2021-08-22 DIAGNOSIS — M542 Cervicalgia: Secondary | ICD-10-CM

## 2021-08-22 DIAGNOSIS — M546 Pain in thoracic spine: Secondary | ICD-10-CM

## 2021-08-22 NOTE — Therapy (Signed)
[place], [place], [address] Phone: [phone]   Fax:  [phone]  Physical Therapy Treatment  Patient Details  Name: Sara Day MRN: [id number] Date of Birth: May 22, 1966 Referring Provider (PT): Andrena Mews MD   Encounter Date: 08/22/2021   PT End of Session - 08/22/21 1104     Visit Number 5    Number of Visits 12    Date for PT Re-Evaluation 09/16/21    Authorization Type Med Pay /BCBS    PT Start Time 1100    PT Stop Time 1145    PT Time Calculation (min) 45 min             Past Medical History:  Diagnosis Date   Asthma    Breast mass    Hypertension     Past Surgical History:  Procedure Laterality Date   BREAST EXCISIONAL BIOPSY      There were no vitals filed for this visit.   Subjective Assessment - 08/22/21 1103     Subjective Shoulder feels good but still have pain with driving and at night.    Currently in Pain? No/denies    Aggravating Factors  night and driving.    Pain Relieving Factors muscles relaxer and pain meds                [facility] PT Assessment - 08/22/21 0001       AROM   Cervical - Right Side Bend 28    Cervical - Left Side Bend 34    Cervical - Right Rotation 60    Cervical - Left Rotation 50                [facility] Adult PT Treatment/Exercise - 08/22/21 0001       Ambulation/Gait   Gait Comments cues for arm swing- use of bilateral dowels with PTA assist for arm swing      Neck Exercises: Machines for Strengthening   UBE (Upper Arm Bike) attempted but increased pain and difficult so  disc.    Nustep L3 UE/LE x 3 minutes      Neck Exercises: Theraband   Shoulder Extension 15 reps    Shoulder Extension Limitations red    Rows 15 reps    Rows Limitations red      Shoulder Exercises: Supine   Horizontal ABduction 15 reps    Theraband Level (Shoulder Horizontal ABduction) Level 1 (Yellow)    External Rotation 10 reps     Theraband Level (Shoulder External Rotation) Level 1 (Yellow)    External Rotation Limitations 2 sets    Diagonals 10 reps   2 sets   Theraband Level (Shoulder Diagonals) Level 1 (Yellow)    Other Supine Exercises supine cervical retraction x 10   over towel roll     Moist Heat Therapy   Number Minutes Moist Heat 10 Minutes    Moist Heat Location Cervical      Electrical Stimulation   Electrical Stimulation Location mid scap and upper traps bilat    Electrical Stimulation Action IFC    Electrical Stimulation Parameters 13 mA    Electrical Stimulation Goals Pain                       PT Short Term Goals - 08/20/21 1309       PT SHORT TERM GOAL #1   Title "Independent with initial HEP    Time 2  Period Weeks    Status Achieved    Target Date 08/19/21      PT SHORT TERM GOAL #2   Title Pt will be albe to walk and sit without gaurding trunk with bil arms due to decreased neck /upper back tension.  able to walk with arm swing    Baseline severe gaurding and not arm swing with walking; 08/20/21- improving    Time 2    Period Weeks    Status On-going      PT SHORT TERM GOAL #3   Title Pt will be able to sit with good Day and no compensatory leaning     Baseline Pt leans back in chair for support of muscle spasms in back: 08/20/21- improving    Time 2    Period Weeks    Status On-going    Target Date 08/19/21               PT Long Term Goals - 08/05/21 1126       PT LONG TERM GOAL #1   Title "Demonstrate and verbalize techniques to reduce the risk of re-injury including: lifting, Day, body mechanics.     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    Baseline eval 59%    Time 6    Status New    Target Date 09/16/21      PT LONG TERM GOAL #6   Title Pt will reduced headaches from every other day to one time a week    Baseline pt with headaches at least every other day impeding her ability to work without distraction    Time 6    Period Weeks    Status New    Target Date 09/16/21                   Plan - 08/22/21 1122     Clinical Impression Statement Sara Day has improved with decreased muscle guarding at rest. She continues to have increased posterior shoulder/ scapular pain on left with driving and at night with sleep. Attempted  UBE with pt not having enough strength to perform revolutions and also increased pain. Move to Nustep and performed at light resistance for 3 minutes when she stopped using left arm. Worked on arm swing with gait and PTA used bilateral dowels to assist in arm movement with gait. Contined with cervical and scapular stabilization with low tolerance for resistance (yellow) and reps. [place] she is showing improvements in muscle guarding, arm swing and participation in therex with less c/o increased pain.    PT Treatment/Interventions ADLs/Self Care Home Management;Cryotherapy;Electrical Stimulation;Iontophoresis 4mg /ml  Dexamethasone;Moist Heat;Traction;Neuromuscular re-education;Therapeutic exercise;Therapeutic activities;Functional mobility training;Patient/family education;Manual techniques;Taping;Dry needling;Passive range of motion;Spinal Manipulations;Joint Manipulations    PT Next Visit Plan FOTO status; assess response to new HEP;  assess TPDN (did not like);    progress DNF strength as able . Manual as needed ; IFC PRN- did she look into purchasing TENS or theracane? (yes ordered theracane )    PT Home Exercise Plan [email or web address]             Patient will benefit from skilled therapeutic intervention in order to improve the following deficits and impairments:  Pain, Improper body mechanics, Postural dysfunction, Impaired UE functional use, Increased muscle spasms, Decreased activity tolerance, Decreased range of motion, Decreased strength, Other (comment)  Visit Diagnosis: Cervicalgia  Pain in thoracic spine  Cramp and spasm  Muscle weakness (generalized)     Problem List Patient Active Problem List   Diagnosis Date Noted   Headache 08/11/2021   Menopause 12/29/2019   HYPERCHOLESTEROLEMIA 05/21/2009   Essential hypertension 05/21/2009   GERD 05/07/2008    Dorene Ar, PTA 08/22/2021, 11:45 AM  [facility] [address] [place], [place], [address] Phone: [phone]   Fax:  [phone]  Name: Sara Day MRN: [id number] Date of Birth: [birth year]

## 2021-08-25 ENCOUNTER — Encounter: Payer: Self-pay | Admitting: Physical Therapy

## 2021-08-25 ENCOUNTER — Other Ambulatory Visit: Payer: Self-pay

## 2021-08-25 ENCOUNTER — Ambulatory Visit: Payer: BLUE CROSS/BLUE SHIELD | Admitting: Physical Therapy

## 2021-08-25 DIAGNOSIS — M546 Pain in thoracic spine: Secondary | ICD-10-CM

## 2021-08-25 DIAGNOSIS — R252 Cramp and spasm: Secondary | ICD-10-CM

## 2021-08-25 DIAGNOSIS — M542 Cervicalgia: Secondary | ICD-10-CM

## 2021-08-25 DIAGNOSIS — M6281 Muscle weakness (generalized): Secondary | ICD-10-CM

## 2021-08-25 NOTE — Therapy (Signed)
[place], [place], [address] Phone: [phone]   Fax:  [phone]  Physical Therapy Treatment  Patient Details  Name: Sara Day MRN: [id number] Date of Birth: November 26, 1965 Referring Provider (PT): Andrena Mews MD   Encounter Date: 08/25/2021   PT End of Session - 08/25/21 1154     Visit Number 6    Number of Visits 12    Date for PT Re-Evaluation 09/16/21    Authorization Type Med Pay /BCBS    PT Start Time 1150    PT Stop Time 1240    PT Time Calculation (min) 50 min             Past Medical History:  Diagnosis Date   Asthma    Breast mass    Hypertension     Past Surgical History:  Procedure Laterality Date   BREAST EXCISIONAL BIOPSY      There were no vitals filed for this visit.   Subjective Assessment - 08/25/21 1152     Subjective Had to drive 3 hours to [place] and had a lot of trouple with the left arm, the pain was 6-7/10 and I had to pull over and rest the arm for 15 minutes.    Currently in Pain? No/denies                               [facility] Adult PT Treatment/Exercise - 08/25/21 0001       Neck Exercises: Machines for Strengthening   Nustep UE only L1 x 5 minutes      Neck Exercises: Theraband   Shoulder Extension 15 reps    Shoulder Extension Limitations red    Rows 15 reps    Rows Limitations red      Neck Exercises: Standing   Other Standing Exercises facing wall - yellow band at wrists- walkouts, walk diagonals x 10 each      Shoulder Exercises: Supine   Horizontal ABduction 15 reps    Theraband Level (Shoulder Horizontal ABduction) Level 1 (Yellow)    Horizontal ABduction Limitations 2 sets    External Rotation 10 reps    Theraband Level (Shoulder External Rotation) Level 1 (Yellow)    External Rotation Limitations 2 sets    Diagonals 10 reps   2 sets   Theraband Level (Shoulder Diagonals) Level 1 (Yellow)    Other Supine  Exercises supine cane press ups and pullovers protraction, all with 2# on dowel x 10 each, then horizontal ab/adduction with wooden dowel x 10                       PT Short Term Goals - 08/20/21 1309       PT SHORT TERM GOAL #1   Title "Independent with initial HEP    Time 2    Period Weeks    Status Achieved    Target Date 08/19/21      PT SHORT TERM GOAL #2   Title Pt will be albe to walk and sit without gaurding trunk with bil arms due to decreased neck /upper back tension.  able to walk with arm swing    Baseline severe gaurding and not arm swing with walking; 08/20/21- improving    Time 2    Period Weeks    Status On-going      PT SHORT TERM GOAL #3   Title Pt will  be able to sit with good posture and no compensatory leaning     Baseline Pt leans back in chair for support of muscle spasms in back: 08/20/21- improving    Time 2    Period Weeks    Status On-going    Target Date 08/19/21               PT Long Term Goals - 08/05/21 1126       PT LONG TERM GOAL #1   Title "Demonstrate and verbalize techniques to reduce the risk of re-injury including: lifting, posture, body mechanics.     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    Baseline eval 59%    Time 6    Status New    Target Date 09/16/21      PT LONG TERM GOAL #6   Title Pt will reduced headaches from every other day to one time a week    Baseline pt with headaches at least every other day impeding her ability to work without distraction    Time 6    Period Weeks    Status New    Target Date 09/16/21                   Plan - 08/25/21 1309     Clinical Impression Statement Pt reports pain exacerbation yesterday with prolonged driving which required her to pull over and rest her arm. She is participating well with PT sessions with noted improvement in exercise tolerance. Able to complete full 5 minutes of UE movment on Nustep and low resistance. Contined with Scap stabilization and neck streching. She has increased pain with AROM of neck in most planes.    PT Treatment/Interventions ADLs/Self Care Home Management;Cryotherapy;Electrical Stimulation;Iontophoresis 4mg /ml Dexamethasone;Moist Heat;Traction;Neuromuscular re-education;Therapeutic exercise;Therapeutic activities;Functional mobility training;Patient/family education;Manual techniques;Taping;Dry needling;Passive range of motion;Spinal Manipulations;Joint Manipulations    PT Next Visit Plan FOTO status; assess response to new HEP;  assess TPDN (did not like);    progress DNF strength as able . Manual as needed ; IFC PRN- did she look  into purchasing TENS or theracane? (yes ordered theracane )    PT Home Exercise Plan [id number]             Patient will benefit from skilled therapeutic intervention in order to improve the following deficits and impairments:  Pain, Improper body mechanics, Postural dysfunction, Impaired UE functional use,  Increased muscle spasms, Decreased activity tolerance, Decreased range of motion, Decreased strength, Other (comment)  Visit Diagnosis: Cervicalgia  Pain in thoracic spine  Cramp and spasm  Muscle weakness (generalized)     Problem List Patient Active Problem List   Diagnosis Date Noted   Headache 08/11/2021   Menopause 12/29/2019   HYPERCHOLESTEROLEMIA 05/21/2009   Essential hypertension 05/21/2009   GERD 05/07/2008    Dorene Ar, PTA 08/25/2021, 2:28 PM  [facility] [address] [place], [place], [address] Phone: [phone]   Fax:  [phone]  Name: Sara Day MRN: [id number] Date of Birth: [birth year]

## 2021-08-27 ENCOUNTER — Encounter: Payer: Self-pay | Admitting: Physical Therapy

## 2021-08-27 ENCOUNTER — Ambulatory Visit: Payer: BLUE CROSS/BLUE SHIELD | Admitting: Physical Therapy

## 2021-08-27 ENCOUNTER — Other Ambulatory Visit: Payer: BLUE CROSS/BLUE SHIELD

## 2021-08-27 ENCOUNTER — Other Ambulatory Visit: Payer: Self-pay

## 2021-08-27 DIAGNOSIS — R252 Cramp and spasm: Secondary | ICD-10-CM

## 2021-08-27 DIAGNOSIS — M542 Cervicalgia: Secondary | ICD-10-CM | POA: Diagnosis not present

## 2021-08-27 DIAGNOSIS — M546 Pain in thoracic spine: Secondary | ICD-10-CM

## 2021-08-27 DIAGNOSIS — M6281 Muscle weakness (generalized): Secondary | ICD-10-CM

## 2021-08-27 NOTE — Therapy (Signed)
[place], [place], [address] Phone: [phone]   Fax:  [phone]  Physical Therapy Treatment  Patient Details  Name: Sara Day MRN: [id number] Date of Birth: 06/21/1966 Referring Provider (PT): Andrena Mews MD   Encounter Date: 08/27/2021   PT End of Session - 08/27/21 1243     Visit Number 7    Number of Visits 12    Date for PT Re-Evaluation 09/16/21    Authorization Type Med Pay /BCBS    PT Start Time 1240    PT Stop Time 1325    PT Time Calculation (min) 45 min             Past Medical History:  Diagnosis Date   Asthma    Breast mass    Hypertension     Past Surgical History:  Procedure Laterality Date   BREAST EXCISIONAL BIOPSY      There were no vitals filed for this visit.   Subjective Assessment - 08/27/21 1242     Subjective I am doing good today. Pain level  0/10.    Currently in Pain? No/denies                               [facility] Adult PT Treatment/Exercise - 08/27/21 0001       Self-Care   Other Self-Care Comments  reviewed use of theracane for TPR and tack and stretch to upper traps      Neck Exercises: Machines for Strengthening   UBE (Upper Arm Bike) Level 1 2 minutes forward and 2 minutes back with 1 minute rest in between      Neck Exercises: Theraband   Rows 10 reps   2 sets   Rows Limitations seated row: 10# (15# too difficult)    Shoulder External Rotation 20 reps    Shoulder External Rotation Limitations yellow      Neck Exercises: Standing   Other Standing Exercises - horizontal abduction x 10      Neck Exercises: Seated   Other Seated Exercise pulleys for shoulder ROM      Manual Therapy   Manual therapy comments TPR to left upper trap                       PT Short Term Goals - 08/20/21 1309       PT SHORT TERM GOAL #1   Title "Independent with initial HEP    Time 2    Period Weeks    Status  Achieved    Target Date 08/19/21      PT SHORT TERM GOAL #2   Title Pt will be albe to walk and sit without gaurding trunk with bil arms due to decreased neck /upper back tension.  able to walk with arm swing    Baseline severe gaurding and not arm swing with walking; 08/20/21- improving    Time 2    Period Weeks    Status On-going      PT SHORT TERM GOAL #3   Title Pt will be able to sit with good posture and no compensatory leaning     Baseline Pt leans back in chair for support of muscle spasms in back: 08/20/21- improving    Time 2    Period Weeks    Status On-going    Target Date 08/19/21  PT Long Term Goals - 08/05/21 1126       PT LONG TERM GOAL #1   Title "Demonstrate and verbalize techniques to reduce the risk of re-injury including: lifting, posture, body mechanics.     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    Baseline eval 59%    Time 6    Status New    Target Date 09/16/21       PT LONG TERM GOAL #6   Title Pt will reduced headaches from every other day to one time a week    Baseline pt with headaches at least every other day impeding her ability to work without distraction    Time 6    Period Weeks    Status New    Target Date 09/16/21                   Plan - 08/27/21 1416     Clinical Impression Statement pt arrives with 0/10 pain today. Tried UBE again and pt able to complete 4 minutes with 1 rest break at half way. Began gym machines for seated row and she tolerated very light weight. Continued with scap stabilization exercises in standing and she continues to demonstrate improvement in tolerance. She continues to be limited in left cervical rotation and c/o left sided neck pain. Performed TPR to left upper trap with poor tolerance to mod pressure. Reviewed instructions on use of her theracane for home for TPR and tack and stretch techniques. Pt requested IFC and HMP at end of sesssion.    PT Treatment/Interventions ADLs/Self Care Home Management;Cryotherapy;Electrical Stimulation;Iontophoresis 4mg /ml Dexamethasone;Moist Heat;Traction;Neuromuscular re-education;Therapeutic exercise;Therapeutic activities;Functional mobility training;Patient/family education;Manual techniques;Taping;Dry needling;Passive range of motion;Spinal Manipulations;Joint Manipulations    PT Next Visit Plan FOTO status; assess response to new HEP;  assess TPDN (did not like);    progress DNF strength as able . Manual as needed ; IFC PRN- did she look into purchasing TENS or theracane? (yes ordered theracane )    PT Home Exercise Plan [id number]  Patient will benefit from skilled therapeutic intervention in order to improve the following deficits and impairments:  Pain, Improper body mechanics, Postural dysfunction, Impaired UE functional use, Increased muscle spasms, Decreased activity tolerance, Decreased range of motion, Decreased strength, Other (comment)  Visit  Diagnosis: Cervicalgia  Pain in thoracic spine  Cramp and spasm  Muscle weakness (generalized)     Problem List Patient Active Problem List   Diagnosis Date Noted   Headache 08/11/2021   Menopause 12/29/2019   HYPERCHOLESTEROLEMIA 05/21/2009   Essential hypertension 05/21/2009   GERD 05/07/2008    Dorene Ar, PTA 08/27/2021, 2:25 PM  [facility] [address] [place], [place], [address] Phone: [phone]   Fax:  [phone]  Name: Sara Day MRN: [id number] Date of Birth: January 11, 1966

## 2021-09-01 ENCOUNTER — Other Ambulatory Visit: Payer: Self-pay

## 2021-09-01 ENCOUNTER — Ambulatory Visit: Payer: BLUE CROSS/BLUE SHIELD | Admitting: Physical Therapy

## 2021-09-01 ENCOUNTER — Encounter: Payer: Self-pay | Admitting: Physical Therapy

## 2021-09-01 DIAGNOSIS — M546 Pain in thoracic spine: Secondary | ICD-10-CM

## 2021-09-01 DIAGNOSIS — R252 Cramp and spasm: Secondary | ICD-10-CM

## 2021-09-01 DIAGNOSIS — M542 Cervicalgia: Secondary | ICD-10-CM

## 2021-09-01 DIAGNOSIS — M6281 Muscle weakness (generalized): Secondary | ICD-10-CM

## 2021-09-01 NOTE — Therapy (Signed)
[place], [place], [address] Phone: [phone]   Fax:  [phone]  Physical Therapy Treatment  Patient Details  Name: Sara Day MRN: [id number] Date of Birth: September 29, 1966 Referring Provider (PT): Andrena Mews MD   Encounter Date: 09/01/2021   PT End of Session - 09/01/21 1104     Visit Number 8    Number of Visits 12    Date for PT Re-Evaluation 09/16/21    Authorization Type Med Pay /BCBS    PT Start Time 1100    PT Stop Time 1150    PT Time Calculation (min) 50 min             Past Medical History:  Diagnosis Date   Asthma    Breast mass    Hypertension     Past Surgical History:  Procedure Laterality Date   BREAST EXCISIONAL BIOPSY      There were no vitals filed for this visit.   Subjective Assessment - 09/01/21 1103     Subjective I am not in any pain today.    Currently in Pain? No/denies                [facility] PT Assessment - 09/01/21 0001       Observation/Other Assessments   Focus on Therapeutic Outcomes (FOTO)  57% status      AROM   Cervical - Right Rotation 68    Cervical - Left Rotation 55   can go to 65 but with pain                          [facility] Adult PT Treatment/Exercise - 09/01/21 0001       Neck Exercises: Machines for Strengthening   UBE (Upper Arm Bike) Level 1 2 minutes forward and 2 minutes back- no rest      Neck Exercises: Theraband   Rows 10 reps   2 sets   Rows Limitations 10# x 10, 15 # x 10    Shoulder External Rotation 20 reps    Shoulder External Rotation Limitations yellow      Neck Exercises: Standing   Other Standing Exercises - horizontal abduction x 10 x2      Neck Exercises: Stretches   Other Neck Stretches Cervical snags with towel for extension and rotation      Shoulder Exercises: Supine   Other Supine Exercises supine cane press 3# and pullovers 3#, protraction 3# with wooden dowel       Electrical Stimulation   Electrical Stimulation Location mid scap and upper traps bilat    Electrical Stimulation Action IFC    Electrical Stimulation Parameters 13 mA    Electrical Stimulation Goals Pain                       PT Short Term Goals - 08/20/21 1309       PT SHORT TERM GOAL #1   Title "Independent with initial HEP    Time 2    Period Weeks    Status Achieved    Target Date 08/19/21      PT SHORT TERM GOAL #2   Title Pt will be albe to walk and sit without gaurding trunk with bil arms due to decreased neck /upper back tension.  able to walk with arm swing    Baseline severe gaurding and not arm swing with walking; 08/20/21- improving  Time 2    Period Weeks    Status On-going      PT SHORT TERM GOAL #3   Title Pt will be able to sit with good posture and no compensatory leaning     Baseline Pt leans back in chair for support of muscle spasms in back: 08/20/21- improving    Time 2    Period Weeks    Status On-going    Target Date 08/19/21               PT Long Term Goals - 08/05/21 1126       PT LONG TERM GOAL #1   Title "Demonstrate and verbalize techniques to reduce the risk of re-injury including: lifting, posture, body mechanics.     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    Baseline eval 59%    Time 6    Status New    Target Date 09/16/21      PT LONG TERM GOAL #6   Title Pt will reduced headaches from every other day to one time a week    Baseline pt with headaches at least every other day impeding her ability to work without distraction    Time 6    Period Weeks    Status New    Target Date 09/16/21                   Plan - 09/01/21 1119     Clinical Impression Statement Improving tolerance to aerobic machines and gym machines for strengthening. Continued dificulty with cervical rotation and extension. Began cervical SNAGS for extension and rotation. Neck and posterior shoulder were irritated with rotation towel SNAG. Cervical Rotation improved but with pain. FOTO score worsened despite subjective improvement.  Repeated IFC per pt request.    PT Treatment/Interventions ADLs/Self Care Home Management;Cryotherapy;Electrical Stimulation;Iontophoresis 4mg /ml Dexamethasone;Moist Heat;Traction;Neuromuscular re-education;Therapeutic exercise;Therapeutic activities;Functional mobility training;Patient/family education;Manual techniques;Taping;Dry needling;Passive range of motion;Spinal Manipulations;Joint Manipulations    PT Next Visit Plan response to new HEP;  assess TPDN (did not like);    progress DNF  strength as able . Manual as needed ; IFC PRN- did she look into purchasing TENS or theracane? (yes ordered theracane )    PT Home Exercise Plan [id number]    Consulted and Agree with Plan of Care Patient             Patient will benefit from skilled therapeutic intervention in order to improve the following deficits and impairments:   Pain, Improper body mechanics, Postural dysfunction, Impaired UE functional use, Increased muscle spasms, Decreased activity tolerance, Decreased range of motion, Decreased strength, Other (comment)  Visit Diagnosis: Cervicalgia  Pain in thoracic spine  Cramp and spasm  Muscle weakness (generalized)     Problem List Patient Active Problem List   Diagnosis Date Noted   Headache 08/11/2021   Menopause 12/29/2019   HYPERCHOLESTEROLEMIA 05/21/2009   Essential hypertension 05/21/2009   GERD 05/07/2008    Dorene Ar, PTA 09/01/2021, 12:48 PM  [facility] [address] [place], [place], [address] Phone: [phone]   Fax:  [phone]  Name: Sara Day MRN: [id number] Date of Birth: 1966-08-29

## 2021-09-04 ENCOUNTER — Other Ambulatory Visit: Payer: Self-pay

## 2021-09-04 ENCOUNTER — Encounter (HOSPITAL_COMMUNITY): Payer: Self-pay

## 2021-09-04 ENCOUNTER — Encounter: Payer: BLUE CROSS/BLUE SHIELD | Admitting: Physical Therapy

## 2021-09-04 ENCOUNTER — Ambulatory Visit: Payer: BLUE CROSS/BLUE SHIELD | Admitting: Physical Therapy

## 2021-09-04 ENCOUNTER — Ambulatory Visit (HOSPITAL_COMMUNITY)
Admission: EM | Admit: 2021-09-04 | Discharge: 2021-09-04 | Disposition: A | Discharge status: Home / Self Care | Payer: BLUE CROSS/BLUE SHIELD | Attending: Family Medicine | Admitting: Family Medicine

## 2021-09-04 DIAGNOSIS — H10532 Contact blepharoconjunctivitis, left eye: Secondary | ICD-10-CM

## 2021-09-04 MED ORDER — CIPROFLOXACIN HCL 0.3 % OP SOLN: 2.0000 [drp] | Freq: Four times a day (QID) | OPHTHALMIC | 0 refills | Status: DC

## 2021-09-04 NOTE — Discharge Instructions (Signed)
If your symptoms do not readily improve with treatment follow-up immediately with your eye doctor for further work-up and evaluation.  If you experience any loss of vision or any pressure behind the eye go immediately to the emergency department as this is a medical emergency.

## 2021-09-04 NOTE — ED Provider Notes (Signed)
[facility]    CSN: [id number] Arrival date & time: 09/04/21  0806      History   Chief Complaint Chief Complaint  Patient presents with   Eye Pain    HPI Sara Day is a 55 y.o. female.   HPI Patient presents with left pain, swelling and itching x2 days.  Patient works at a childcare facility and endorses recent contact with a children that have been diagnosed with pink eye.  She endorses itching of the left eye along with the left upper eyelid swelling persistent thickened drainage along with crusting upon awakening of the left.  She denies any pressure behind the eye.  She does have hypertension and reports she did not take her medication prior to arriving here at urgent care.  She denies any loss of vision but endorses light sensitivity. Denies sensation of FB. Past Medical History:  Diagnosis Date   Asthma    Breast mass    Hypertension     Patient Active Problem List   Diagnosis Date Noted   Headache 08/11/2021   Menopause 12/29/2019   HYPERCHOLESTEROLEMIA 05/21/2009   Essential hypertension 05/21/2009   GERD 05/07/2008    Past Surgical History:  Procedure Laterality Date   BREAST EXCISIONAL BIOPSY      OB History   No obstetric history on file.      Home Medications    Prior to Admission medications   Medication Sig Start Date End Date Taking? Authorizing Provider  albuterol (VENTOLIN HFA) 108 (90 Base) MCG/ACT inhaler INHALE 2 PUFFS INTO THE LUNGS EVERY 4 HOURS AS NEEDED FOR WHEEZING Patient not taking: Reported on 08/11/2021 07/10/21   Carvel Getting, NP  amLODipine (NORVASC) 5 MG tablet Take 1 tablet (5 mg total) by mouth at bedtime. Patient not taking: Reported on 08/11/2021 11/05/20   Richarda Osmond, MD  hydrochlorothiazide (HYDRODIURIL) 25 MG tablet Take 1 tablet (25 mg total) by mouth daily. 08/11/21   Kinnie Feil, MD  loratadine (CLARITIN) 10 MG tablet Take 1 tablet (10 mg total) by mouth daily. Patient not taking:  Reported on 08/11/2021 01/23/21   Richarda Osmond, MD  meloxicam (MOBIC) 15 MG tablet TAKE 1 TABLET(15 MG) BY MOUTH DAILY 07/28/21   Autry-Lott, Naaman Plummer, DO  triamcinolone cream (KENALOG) 0.1 % Apply 1 application topically 2 (two) times daily. Patient not taking: Reported on 08/11/2021 05/18/21   Teodora Medici, FNP    Family History Family History  Problem Relation Age of Onset   Heart attack Father    Colon cancer Neg Hx    Colon polyps Neg Hx    Esophageal cancer Neg Hx    Rectal cancer Neg Hx    Stomach cancer Neg Hx     Social History Social History   Tobacco Use   Smoking status: Never   Smokeless tobacco: Never  Vaping Use   Vaping Use: Never used  Substance Use Topics   Alcohol use: Yes    Alcohol/week: 1.0 standard drink    Types: 1 Glasses of wine per week   Drug use: No     Allergies   Clindamycin/lincomycin, Codeine, Metronidazole, Sulfa antibiotics, and Sulfamethoxazole  Review of Systems Review of Systems Pertinent negatives listed in HPI  Physical Exam Triage Vital Signs ED Triage Vitals  Enc Vitals Group     BP 09/04/21 0826 (!) 179/112     Pulse Rate 09/04/21 0826 63     Resp --  Temp 09/04/21 0826 97.9 F (36.6 C)     Temp Source 09/04/21 0826 Oral     SpO2 09/04/21 0826 99 %     Weight --      Height --      Head Circumference --      Peak Flow --      Pain Score 09/04/21 0833 4     Pain Loc --      Pain Edu? --      Excl. in [place]? --    No data found.  Updated Vital Signs BP (!) 179/112 (BP Location: Left Arm) Comment: Has not taken BP medication today  Pulse 63   Temp 97.9 F (36.6 C) (Oral)   SpO2 99%   Visual Acuity Right Eye Distance:   Left Eye Distance:   Bilateral Distance:    Right Eye Near:   Left Eye Near:    Bilateral Near:     Physical Exam Constitutional:      Appearance: Normal appearance.  HENT:     Head: Normocephalic and atraumatic.     Nose: Nose normal.  Eyes:     General: Lids are normal.         Right eye: No foreign body or discharge.        Left eye: Discharge present.No foreign body or hordeolum.     Extraocular Movements:     Right eye: Normal extraocular motion and no nystagmus.     Left eye: Normal extraocular motion and no nystagmus.     Conjunctiva/sclera:     Right eye: Right conjunctiva is not injected.     Left eye: Left conjunctiva is injected.  Cardiovascular:     Rate and Rhythm: Normal rate and regular rhythm.  Pulmonary:     Effort: Pulmonary effort is normal.     Breath sounds: Normal breath sounds and air entry.  Skin:    General: Skin is warm.     Capillary Refill: Capillary refill takes less than 2 seconds.  Neurological:     Mental Status: She is alert.  Psychiatric:        Attention and Perception: Attention and perception normal.        Mood and Affect: Mood normal.   UC Treatments / Results  Labs (all labs ordered are listed, but only abnormal results are displayed) Labs Reviewed - No data to display  EKG   Radiology No results found.  Procedures Procedures (including critical care time)  Medications Ordered in UC Medications - No data to display  Initial Impression / Assessment and Plan / UC Course  I have reviewed the triage vital signs and the nursing notes.  Pertinent labs & imaging results that were available during my care of the patient were reviewed by me and considered in my medical decision making (see chart for details).    Contact blepharoconjunctivitis involving the left eye Treatment with ciprofloxacin as directed. Discussed with patient her blood pressure is very elevated and to follow-up with her primary care doctor.  Patient is asymptomatic of any dizziness, headache, chest pain or shortness of breath.    Advised to take medication and monitor blood pressure at home. Final Clinical Impressions(s) / UC Diagnoses   Final diagnoses:  Contact blepharoconjunctivitis of left eye   Discharge Instructions   None     ED Prescriptions     Medication Sig Dispense Auth. Provider   ciprofloxacin (CILOXAN) 0.3 % ophthalmic solution Place 2 drops into the right eye  every 6 (six) hours. 5 mL Scot Jun, FNP      PDMP not reviewed this encounter.   Scot Jun, FNP 09/09/21 [phone]

## 2021-09-04 NOTE — ED Triage Notes (Signed)
Pt c/o lt eye pain, swelling, and itching x2 days. 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

## 2021-09-05 ENCOUNTER — Other Ambulatory Visit: Payer: BLUE CROSS/BLUE SHIELD

## 2021-09-08 ENCOUNTER — Ambulatory Visit: Payer: BLUE CROSS/BLUE SHIELD

## 2021-09-09 ENCOUNTER — Encounter: Payer: Self-pay | Admitting: Physical Therapy

## 2021-09-09 ENCOUNTER — Ambulatory Visit: Payer: BLUE CROSS/BLUE SHIELD | Admitting: Physical Therapy

## 2021-09-09 ENCOUNTER — Other Ambulatory Visit: Payer: Self-pay

## 2021-09-09 ENCOUNTER — Encounter: Payer: BLUE CROSS/BLUE SHIELD | Admitting: Physical Therapy

## 2021-09-09 DIAGNOSIS — R252 Cramp and spasm: Secondary | ICD-10-CM

## 2021-09-09 DIAGNOSIS — M542 Cervicalgia: Secondary | ICD-10-CM | POA: Diagnosis not present

## 2021-09-09 DIAGNOSIS — M546 Pain in thoracic spine: Secondary | ICD-10-CM

## 2021-09-09 DIAGNOSIS — M6281 Muscle weakness (generalized): Secondary | ICD-10-CM

## 2021-09-09 NOTE — Patient Instructions (Addendum)
Sleeping on Back  Place pillow under knees. A pillow with cervical support and a roll around waist are also helpful. Copyright  VHI. All rights reserved.  Sleeping on Side Place pillow between knees. Use cervical support under neck and a roll around waist as needed. Copyright  VHI. All rights reserved.   Sleeping on Stomach   If this is the only desirable sleeping position, place pillow under lower legs, and under stomach or chest as needed.  Posture - Sitting   Sit upright, head facing forward. Try using a roll to support lower back. Keep shoulders relaxed, and avoid rounded back. Keep hips level with knees. Avoid crossing legs for long periods. Stand to Sit / Sit to Stand   To sit: Bend knees to lower self onto front edge of chair, then scoot back on seat. To stand: Reverse sequence by placing one foot forward, and scoot to front of seat. Use rocking motion to stand up.   Work Height and Reach  Ideal work height is no more than 2 to 4 inches below elbow level when standing, and at elbow level when sitting. Reaching should be limited to arm's length, with elbows slightly bent.  Bending  Bend at hips and knees, not back. Keep feet shoulder-width apart.    Posture - Standing   Good posture is important. Avoid slouching and forward head thrust. Maintain curve in low back and align ears over shoul- ders, hips over ankles.  Alternating Positions   Alternate tasks and change positions frequently to reduce fatigue and muscle tension. Take rest breaks. Computer Work   Position work to Programmer, multimedia. Use proper work and seat height. Keep shoulders back and down, wrists straight, and elbows at right angles. Use chair that provides full back support. Add footrest and lumbar roll as needed.  Getting Into / Out of Car  Lower self onto seat, scoot back, then bring in one leg at a time. Reverse sequence to get out.  Dressing  Lie on back to pull socks or slacks over feet, or sit  and bend leg while keeping back straight.    Housework - Sink  Place one foot on ledge of cabinet under sink when standing at sink for prolonged periods.   Pushing / Pulling  Pushing is preferable to pulling. Keep back in proper alignment, and use leg muscles to do the work.  Deep Squat   Squat and lift with both arms held against upper trunk. Tighten stomach muscles without holding breath. Use smooth movements to avoid jerking.  Avoid Twisting   Avoid twisting or bending back. Pivot around using foot movements, and bend at knees if needed when reaching for articles.  Carrying Luggage   Distribute weight evenly on both sides. Use a cart whenever possible. Do not twist trunk. Move body as a unit.   Lifting Principles Maintain proper posture and head alignment. Slide object as close as possible before lifting. Move obstacles out of the way. Test before lifting; ask for help if too heavy. Tighten stomach muscles without holding breath. Use smooth movements; do not jerk. Use legs to do the work, and pivot with feet. Distribute the work load symmetrically and close to the center of trunk. Push instead of pull whenever possible.   WALKING  Walking is a great form of exercise to increase your strength, endurance and overall fitness.  A walking program can help you start slowly and gradually build endurance as you go.  Everyone's ability is different, so each  person's starting point will be different.  You do not have to follow them exactly.  The are just samples. You should simply find out what's right for you and stick to that program.   In the beginning, you'll start off walking 2-3 times a day for short distances.  As you get stronger, you'll be walking further at just 1-2 times per day. 30 minutes 5 x a week to 60 minutes 5 x a week.   You can use your treadmill. For 30 minutes  A. You Can Walk For A Certain Length Of Time Each Day    Walk 5 minutes 3 times per  day.  Increase 2 minutes every 2 days (3 times per day).  Work up to 25-30 minutes (1-2 times per day).   Example:   Day 1-2 5 minutes 3 times per day   Day 7-8 12 minutes 2-3 times per day   Day 13-14 25 minutes 1-2 times per day  B. You Can Walk For a Certain Distance Each Day     Distance can be substituted for time.    Example:   3 trips to mailbox (at road)   3 trips to corner of block   3 trips around the block  C. Go to local high school and use the track.    Walk for distance ____ around track  Or time ____ minutes  D. Walk __x__ Jog ____ Run ___  Please only do the exercises that your therapist has initialed and dated     TENS UNIT  This is helpful for muscle pain and spasm.   Search and Purchase a TENS 7000 2nd edition at www.tenspros.com or www.amazon.com  (It should be less than $30)     TENS unit instructions:  Do not shower or bathe with the unit on Turn the unit off before removing electrodes or batteries If the electrodes lose stickiness add a drop of water to the electrodes after they are disconnected from the unit and place on plastic sheet. If you continued to have difficulty, call the TENS unit company to purchase more electrodes. Do not apply lotion on the skin area prior to use. Make sure the skin is clean and dry as this will help prolong the life of the electrodes. After use, always check skin for unusual red areas, rash or other skin difficulties. If there are any skin problems, does not apply electrodes to the same area. Never remove the electrodes from the unit by pulling the wires. Do not use the TENS unit or electrodes other than as directed. Do not change electrode placement without consulting your therapist or physician. Keep 2 fingers with between each electrode.  Voncille Lo, PT, [place] Certified Exercise Expert for the Aging Adult  09/09/21 11:41 AM Phone: [phone] Fax: [phone]

## 2021-09-09 NOTE — Therapy (Signed)
[place], [place], [address] Phone: [phone]   Fax:  [phone]  Physical Therapy Treatment  Patient Details  Name: Sara Day MRN: [id number] Date of Birth: [birth year] Referring Provider (PT): Andrena Mews MD   Encounter Date: 09/09/2021   PT End of Session - 09/09/21 1138     Visit Number 9    Number of Visits 12    Date for PT Re-Evaluation 09/16/21    Authorization Type Med Pay /BCBS    PT Start Time 0300    PT Stop Time 9233    PT Time Calculation (min) 70 min    Activity Tolerance Patient limited by pain;Patient tolerated treatment well    Behavior During Therapy [facility] for tasks assessed/performed             Past Medical History:  Diagnosis Date   Asthma    Breast mass    Hypertension     Past Surgical History:  Procedure Laterality Date   BREAST EXCISIONAL BIOPSY      There were no vitals filed for this visit.   Subjective Assessment - 09/09/21 1146     Subjective I am feeling OK. I put muscle relax cream on m today. I am really feeling in at night and when I am driving.  I wasnt sure how to the do the one with the towel exercise    Pertinent History HTN, GERD, benign breast bx    Limitations Lifting    How long can you sit comfortably? 30-40  min    How long can you stand comfortably? 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 Yes    Pain Score 2     Pain Orientation Left    Pain Descriptors / Indicators Aching                               [facility] Adult PT Treatment/Exercise - 09/09/21 0001       Self-Care   ADL's went over some pain management with ADL;s    Posture sitting standing and sleeping posture    Other Self-Care Comments  walking program given  pt will walk 30 minutes 5 x a week      Neck Exercises: Theraband   Rows 10  reps;Red    Rows Limitations around door knob    Shoulder External Rotation 20 reps    Shoulder External Rotation Limitations red      Neck Exercises: Standing   Other Standing Exercises - horizontal abduction x 10 x2   red t band / star pattern     Neck Exercises: Stretches   Upper Trapezius Stretch Left;Right;2 reps;30 seconds    Levator Stretch Right;Left;2 reps;30 seconds    Other Neck Stretches Cervical snags with towel for extension and rotation   needed extra reinforcement     Shoulder Exercises: Supine   Other Supine Exercises neck retraction into towel 10 x 2 5 sec hold      Modalities   Modalities Moist Heat      Moist Heat Therapy   Number Minutes Moist Heat 15 Minutes    Moist Heat Location Cervical;Shoulder      Electrical Stimulation   Electrical Stimulation Location mid scap and upper traps bilat  Electrical Stimulation Action IFC    Electrical Stimulation Parameters 19 mA    Electrical Stimulation Goals Pain      Manual Therapy   Manual Therapy Soft tissue mobilization;Joint mobilization    Manual therapy comments TPR to bil  upper trap and bil cervical paraspinals  IASTYM to same areas    Joint Mobilization L UPA mobs Grade 2/3    Soft tissue mobilization bil cervical paraspinals with IASTYM                       PT Short Term Goals - 09/09/21 1229       PT SHORT TERM GOAL #1   Title "Independent with initial HEP    Time 2    Period Weeks    Status Achieved    Target Date 08/19/21      PT SHORT TERM GOAL #2   Title Pt will be albe to walk and sit without gaurding trunk with bil arms due to decreased neck /upper back tension.  able to walk with arm swing    Time 2    Period Weeks    Status Achieved    Target Date 08/19/21      PT SHORT TERM GOAL #3   Title Pt will be able to sit with good posture and no compensatory leaning     Baseline Pt leans back in chair for support of muscle spasms in back: 08/20/21- improving    Time 2     Period Weeks    Status On-going               PT Long Term Goals - 09/09/21 1230       PT LONG TERM GOAL #1   Title "Demonstrate and verbalize techniques to reduce the risk of re-injury including: lifting, posture, body mechanics.     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    Baseline eval 59% to 57%    Time 6    Period Weeks    Status On-going  Target Date 09/16/21      PT LONG TERM GOAL #6   Title Pt will reduced headaches from every other day to one time a week    Baseline pt headache one time a week    Time 6    Period Weeks    Status On-going    Target Date 09/16/21                   Plan - 09/09/21 1154     Clinical Impression Statement Pt returns to clinic with 2/10 pain and only getting one headache a week.  Pt is very TTP to bil cervical paraspinals and bil shld L> R.  pt demonstrates weakness and decreased endurance performing neck and shld exercises.  Pt moved to  red t  band but clearly needs continued supervision to perform exercises correctly and more beneficially.  Pt  has one more visit for re eval and her weakness indicates further PT needed to maximized benefit and to increase neck AROM to WNL limits    Examination-Activity Limitations Carry;Dressing;Lift    Examination-Participation Restrictions Cleaning;Occupation;Laundry    PT Frequency 2x / week    PT Duration 6 weeks    PT Treatment/Interventions ADLs/Self Care Home Management;Cryotherapy;Electrical Stimulation;Iontophoresis 4mg /ml Dexamethasone;Moist Heat;Traction;Neuromuscular re-education;Therapeutic exercise;Therapeutic activities;Functional mobility training;Patient/family education;Manual techniques;Taping;Dry needling;Passive range of motion;Spinal Manipulations;Joint Manipulations    PT Next Visit Plan ;    progress DNF strength as able . Manual as needed ; IFC PRN- did she look into purchasing TENS or theracane? (yes ordered theracane )ERO next visit    PT Home Exercise Plan [id number]    Consulted and Agree with Plan of Care Patient             Patient will benefit from skilled therapeutic intervention in order to improve the following deficits and impairments:  Pain, Improper body mechanics, Postural dysfunction, Impaired UE functional use, Increased muscle spasms, Decreased activity tolerance, Decreased range of motion, Decreased strength, Other (comment)  Visit Diagnosis: Cervicalgia  Pain in thoracic spine  Cramp and spasm  Muscle weakness (generalized)     Problem List Patient Active Problem List   Diagnosis Date Noted   Headache 08/11/2021   Menopause 12/29/2019   HYPERCHOLESTEROLEMIA 05/21/2009   Essential hypertension 05/21/2009   GERD 05/07/2008   Voncille Lo, PT, [place] Certified Exercise Expert for the Aging Adult  09/09/21 1:21 PM Phone: [phone] Fax: [facility] [address] [place], [place], [address] Phone: [phone]   Fax:  [phone]  Name: LOANY NEUROTH MRN: [id number] Date of Birth: [birth year]

## 2021-09-16 ENCOUNTER — Other Ambulatory Visit: Payer: Self-pay

## 2021-09-16 ENCOUNTER — Encounter: Payer: Self-pay | Admitting: Physical Therapy

## 2021-09-16 ENCOUNTER — Encounter: Payer: BLUE CROSS/BLUE SHIELD | Admitting: Physical Therapy

## 2021-09-16 ENCOUNTER — Ambulatory Visit: Payer: BLUE CROSS/BLUE SHIELD | Admitting: Physical Therapy

## 2021-09-16 DIAGNOSIS — M542 Cervicalgia: Secondary | ICD-10-CM | POA: Diagnosis not present

## 2021-09-16 DIAGNOSIS — R252 Cramp and spasm: Secondary | ICD-10-CM

## 2021-09-16 DIAGNOSIS — M546 Pain in thoracic spine: Secondary | ICD-10-CM

## 2021-09-16 DIAGNOSIS — M6281 Muscle weakness (generalized): Secondary | ICD-10-CM
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
## 2021-09-25 ENCOUNTER — Other Ambulatory Visit: Payer: Self-pay

## 2021-09-25 ENCOUNTER — Ambulatory Visit: Payer: BLUE CROSS/BLUE SHIELD | Attending: Family Medicine | Admitting: Physical Therapy

## 2021-09-25 DIAGNOSIS — R252 Cramp and spasm: Secondary | ICD-10-CM | POA: Insufficient documentation

## 2021-09-25 DIAGNOSIS — M542 Cervicalgia: Secondary | ICD-10-CM | POA: Diagnosis not present

## 2021-09-25 DIAGNOSIS — M546 Pain in thoracic spine: Secondary | ICD-10-CM | POA: Insufficient documentation

## 2021-09-25 DIAGNOSIS — M6281 Muscle weakness (generalized): Secondary | ICD-10-CM | POA: Diagnosis present

## 2021-09-25 NOTE — Patient Instructions (Signed)
Added to HEP Access Code: [email or web address] URL: [email or web address] Date: 09/25/2021 Prepared by: Voncille Lo   Supine Deep Neck Flexor Training - Repetitions - 1 x daily - 7 x weekly - 1-2 sets - 8-10 reps - hold 5 - 10 sec hold Cervical Rotation Prone on Elbows - 1 x daily - 7 x weekly - 3 sets - 10 reps Voncille Lo, PT, [facility] Certified Exercise Expert for the Aging Adult  09/25/21 10:40 AM Phone: [phone] Fax: [phone]

## 2021-09-25 NOTE — Therapy (Signed)
[place], [place], [address] Phone: [phone]   Fax:  [phone]  Physical Therapy Treatment  Patient Details  Name: Sara Day MRN: [id number] Date of Birth: 29-Aug-1966 Referring Provider (PT): Andrena Mews MD   Encounter Date: 09/25/2021   PT End of Session - 09/25/21 1129     Visit Number 11    Number of Visits 18    Date for PT Re-Evaluation 11/11/21    Authorization Type Med Pay /BCBS    PT Start Time 1017    PT Stop Time 1105    PT Time Calculation (min) 48 min    Activity Tolerance Patient tolerated treatment well    Behavior During Therapy [facility] for tasks assessed/performed             Past Medical History:  Diagnosis Date   Asthma    Breast mass    Hypertension     Past Surgical History:  Procedure Laterality Date   BREAST EXCISIONAL BIOPSY      There were no vitals filed for this visit.   [place] Adult PT Treatment/Exercise:   Therapeutic Exercise:  chin tucks 10 sec hold x 10, chin tuck with flexion x 10 sec. Pt needs VC and TC to increase intensity of neck contraction Supine neck chin tuck with rotation 5 x to R and to L Standing horizontal abd/ diagonal start pattern with red t band Supine- bench press using DB 2 7 lb   3 x 10 reps Standing OH press with 15 lb 3 x 10  tried 20lb but unable to bring overhead Rows with red t band around door knob  3 x 10 Bil  shld extension  with red t band 3 x 10 Bil ER with red t band  3 x 10  Manual Therapy: Suboccipital release Grade I/II CPA C-2 to C-6 Gentle PROM of cervical rotation.  Pt with marked sensitivity and TTP on left paracervicals    Neuromuscular re-ed:     Therapeutic Activity:   Modalities: Heat pack on neck concurrent with exercise  Self Care:    ITEMS NOT PERFORMED TODAY:                               PT Education - 09/25/21 1040     Education Details added neck  strength to HEP    Person(s) Educated Patient    Methods Explanation;Demonstration;Tactile cues;Verbal cues;Handout    Comprehension Verbalized understanding;Returned demonstration              PT Short Term Goals - 09/16/21 1202       PT SHORT TERM GOAL #1   Title "Independent with initial HEP    Time 2    Period Weeks    Status Achieved      PT SHORT TERM GOAL #2   Title Pt will be albe to walk and sit without gaurding trunk with bil arms due to decreased neck /upper back tension.  able to walk with arm swing    Baseline no longer gaurding when walking  and walking 30 minu a day on treadmill    Time 2    Period Weeks    Status Achieved      PT SHORT TERM GOAL #3   Title Pt will be able to sit with good posture and no compensatory leaning     Baseline Pt aware of good posture  and incorporates into daily life    Time 2    Status Achieved               PT Long Term Goals - 09/16/21 1204       PT LONG TERM GOAL #1   Title "Demonstrate and verbalize techniques to reduce the risk of re-injury including: lifting, posture, body mechanics.     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    Baseline eval 59% to 57% to 56% 09-16-21 slightly worse than original score    Time 6    Period Weeks    Status On-going    Target Date 11/11/21      PT LONG TERM GOAL #6   Title Pt will reduced headaches from every other day to one time a week    Baseline pt headache twice a week presently    Time 8    Period Weeks    Status Achieved    Target Date 11/11/21      PT LONG TERM GOAL #7   Title Pt will be able to lift 25# overhead with bil arms and good control to show increased UE and neck strength    Baseline Pt struggle with 15 # x 3 reps overhead.    Time 8    Period Weeks    Status New    Target Date 11/11/21                   Plan - 09/25/21 1023     Clinical Impression Statement Pt with difficulty holding 10 sec for chin tucks, with flexion and with rotation in supine.  Pt with DNF weakness and complaints of tightness and pain rotatiing neck to the left.  Pt is cognizant of the fact she needs to strengthen to protect her neck and in order to progress with lifting weights and exercise. She was unable to lift 20 lb DB overhead but able to lift 15 lb.  Pt  is motivated to concentrate on strengthening.  After RX session with minimal manual and mostly exercise  pain was 0/10. Will continue POC to complete all goals    Examination-Activity Limitations Carry;Dressing;Lift    Examination-Participation Restrictions Cleaning;Occupation;Laundry    PT Treatment/Interventions ADLs/Self Care Home Management;Cryotherapy;Electrical Stimulation;Iontophoresis 4mg /ml Dexamethasone;Moist Heat;Traction;Neuromuscular re-education;Therapeutic exercise;Therapeutic activities;Functional mobility training;Patient/family education;Manual  techniques;Taping;Dry needling;Passive range of motion;Spinal Manipulations;Joint Manipulations    PT Next Visit Plan progress DNF strength as able . Manual as needed ; IFC PRN- did she look into purchasing TENS or theracane? (yes ordered theracane )ERO next visit    PT Home Exercise Plan [email or web address]    Consulted and Agree with Plan of Care Patient             Patient will benefit from skilled therapeutic intervention in order to improve the following deficits and impairments:  Pain, Improper body mechanics, Postural dysfunction, Impaired UE functional use, Increased muscle spasms, Decreased activity tolerance, Decreased range of motion, Decreased strength, Other (comment)  Visit Diagnosis: Cervicalgia  Pain in thoracic spine  Cramp and spasm  Muscle weakness (generalized)     Problem List Patient Active Problem List   Diagnosis Date Noted   Headache 08/11/2021   Menopause 12/29/2019   HYPERCHOLESTEROLEMIA 05/21/2009   Essential hypertension 05/21/2009   GERD 05/07/2008    Dorothea Ogle, PT 09/25/2021, 11:32 AM  [facility] [facility] [address] [place], [place], [address] Phone: [phone]   Fax:  [phone]  Name: SHANTRICE RODENBERG MRN: [id number] Date of Birth: Mar 05, 1966

## 2021-10-01 ENCOUNTER — Other Ambulatory Visit: Payer: Self-pay

## 2021-10-01 ENCOUNTER — Ambulatory Visit: Payer: BLUE CROSS/BLUE SHIELD | Admitting: Physical Therapy

## 2021-10-01 ENCOUNTER — Encounter: Payer: Self-pay | Admitting: Physical Therapy

## 2021-10-01 DIAGNOSIS — M6281 Muscle weakness (generalized): Secondary | ICD-10-CM

## 2021-10-01 DIAGNOSIS — M546 Pain in thoracic spine: Secondary | ICD-10-CM

## 2021-10-01 DIAGNOSIS — M542 Cervicalgia: Secondary | ICD-10-CM | POA: Diagnosis not present

## 2021-10-01 DIAGNOSIS — R252 Cramp and spasm: Secondary | ICD-10-CM

## 2021-10-01 NOTE — Therapy (Signed)
[place], [place], [address] Phone: [phone]   Fax:  [phone]  Physical Therapy Treatment  Patient Details  Name: Sara Day MRN: [id number] Date of Birth: 07-30-1966 Referring Provider (PT): Andrena Mews MD   Encounter Date: 10/01/2021   PT End of Session - 10/01/21 1025     Visit Number 12    Number of Visits 18    Date for PT Re-Evaluation 11/11/21    Authorization Type Med Pay /BCBS    PT Start Time 1020    PT Stop Time 1100    PT Time Calculation (min) 40 min             Past Medical History:  Diagnosis Date   Asthma    Breast mass    Hypertension     Past Surgical History:  Procedure Laterality Date   BREAST EXCISIONAL BIOPSY      There were no vitals filed for this visit.   Subjective Assessment - 10/01/21 1025     Subjective Turning head with driving is better but not all the way gone. I am getting in the floor with then kids more.    Currently in Pain? No/denies    Pain Location Neck              [facility] Adult PT Treatment/Exercise:   Therapeutic Exercise: UBE level 1 x 2.5 min each way- difficult  Standing OH press with 15 lb 3 x 10  tried 20lb but unable to bring overhead Bil ER with red t band  3 x 10 chin tuck with flexion x 10 sec. Pt needs VC and TC to increase intensity of neck contraction Supine neck chin tuck with rotation 5 x to R and to L Standing horizontal abd/ diagonal start pattern with red t band Supine- bench press using DB 2 8 lb   3 x 10 reps       PT Short Term Goals - 10/01/21 1026       PT SHORT TERM GOAL #1   Title "Independent with initial HEP    Time 2    Period Weeks    Status Achieved    Target Date 08/19/21      PT SHORT TERM GOAL #2   Title Pt will be albe to walk and sit without gaurding trunk with bil arms due to decreased neck /upper back tension.  able to walk with arm swing    Baseline no longer gaurding  when walking  and walking 30 minu a day on treadmill    Time 2    Status Achieved      PT SHORT TERM GOAL #3   Title Pt will be able to sit with good posture and no compensatory leaning     Baseline Pt aware of good posture and incorporates into daily life    Time 2    Period Weeks    Status Achieved               PT Long Term Goals - 10/01/21 1026       PT LONG TERM GOAL #1   Title "Demonstrate and verbalize techniques to reduce the risk of re-injury including: lifting, posture, body mechanics.     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    Baseline eval 59% to 57% to 56% 09-16-21 slightly worse than original score    Time 6    Period Weeks    Status On-going      PT LONG TERM GOAL #6   Title Pt will reduced headaches from  every other day to one time a week    Baseline 10/01/21- no longer has headaches    Time 8    Period Weeks    Status Achieved    Target Date 11/11/21      PT LONG TERM GOAL #7   Title Pt will be able to lift 25# overhead with bil arms and good control to show increased UE and neck strength    Baseline Pt struggle with 15 # x 3 reps overhead.    Time 8    Period Weeks    Status On-going    Target Date 11/11/21                   Plan - 10/01/21 1109     Clinical Impression Statement Sara Day reports improving neck pain with driving however continued pain with cervcal rotation and prolonged driving distance causes increased neck stiffness. She does report improvement in ability to sleep most nights without waking due to neck pain. Frequency of waking due to neck pain: 1 x per week. Continued with DNF and UE strength focus. Pt still unable to lift 20# weight over head so continued with 15#. She continues to struggle with completing a full 5 minutes on UBE without rest break.    PT Treatment/Interventions ADLs/Self Care Home Management;Cryotherapy;Electrical Stimulation;Iontophoresis 4mg /ml Dexamethasone;Moist Heat;Traction;Neuromuscular re-education;Therapeutic exercise;Therapeutic activities;Functional mobility training;Patient/family education;Manual techniques;Taping;Dry needling;Passive range of motion;Spinal Manipulations;Joint Manipulations    PT Next Visit Plan progress DNF strength and lifting as able . Manual as needed ;    PT Home Exercise Plan [email or web address]    Consulted and Agree with Plan of Care Patient             Patient will benefit from skilled therapeutic intervention in order to  improve the following deficits and impairments:  Pain, Improper body mechanics, Postural dysfunction, Impaired UE functional use, Increased muscle spasms, Decreased activity tolerance, Decreased range of motion, Decreased strength, Other (comment)  Visit Diagnosis: Cervicalgia  Pain in  thoracic spine  Cramp and spasm  Muscle weakness (generalized)     Problem List Patient Active Problem List   Diagnosis Date Noted   Headache 08/11/2021   Menopause 12/29/2019   HYPERCHOLESTEROLEMIA 05/21/2009   Essential hypertension 05/21/2009   GERD 05/07/2008    Dorene Ar, PTA 10/01/2021, 11:13 AM  [facility] [address] [place], [place], [address] Phone: [phone]   Fax:  [phone]  Name: Sara Day MRN: [id number] Date of Birth: Jan 15, 1966

## 2021-10-07 ENCOUNTER — Other Ambulatory Visit: Payer: Self-pay

## 2021-10-07 ENCOUNTER — Ambulatory Visit: Payer: BLUE CROSS/BLUE SHIELD | Admitting: Physical Therapy

## 2021-10-07 DIAGNOSIS — R252 Cramp and spasm: Secondary | ICD-10-CM

## 2021-10-07 DIAGNOSIS — M6281 Muscle weakness (generalized): Secondary | ICD-10-CM

## 2021-10-07 DIAGNOSIS — M546 Pain in thoracic spine: Secondary | ICD-10-CM

## 2021-10-07 DIAGNOSIS — M542 Cervicalgia: Secondary | ICD-10-CM | POA: Diagnosis not present

## 2021-10-07 NOTE — Therapy (Signed)
[place], [place], [address] Phone: [phone]   Fax:  [phone]  Physical Therapy Treatment  Patient Details  Name: Sara Day MRN: [id number] Date of Birth: September 01, 1966 Referring Provider (PT): Sara Mews MD   Encounter Date: 10/07/2021   PT End of Session - 10/07/21 1342     Visit Number 13    Number of Visits 18    Date for PT Re-Evaluation 11/11/21    Authorization Type Med Pay /BCBS    PT Start Time 1102    PT Stop Time 1145    PT Time Calculation (min) 43 min    Activity Tolerance Patient tolerated treatment well    Behavior During Therapy [facility] for tasks assessed/performed             Past Medical History:  Diagnosis Date   Asthma    Breast mass    Hypertension     Past Surgical History:  Procedure Laterality Date   BREAST EXCISIONAL BIOPSY      There were no vitals filed for this visit.   Subjective Assessment - 10/07/21 1110     Subjective I am 0/10 pain today. I am able to do the UBE  It was hard last time.  I am sleeping better than last time and I can move my neck better    Pertinent History HTN, GERD, benign breast bx    Limitations Lifting    How long can you sit comfortably? 45 min to 1 hour    How long can you stand comfortably? 2 hours    How long can you walk comfortably? 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"    Currently in Pain? No/denies    Pain Score 0-No pain    Pain Location Neck    Pain Orientation Left    Pain Type Chronic pain               [facility] Adult PT Treatment/Exercise:   Therapeutic Exercise: UBE level 2 x 2 min each way- PT VC for chest up/ chin down long line for proper posture  much easier this week. Standing OH press with 15 lb 2 x 10  then 20lb 1 x 10 Bil ER with green t band  3 x 10 chin tuck  with flexion x 10 sec. Pt needs VC and TC to increase intensity of neck contraction Supine neck chin tuck with rotation 5 x to R and to L Standing horizontal abd/ diagonal start pattern with green t band, showed pt how to do exercise with thumbs up to decrease IR of shld and prevent irritation Supine- bench press using DB 2 10 lb   3 x 10 reps Towel rotation cervical rotation to L and R VC and TC for proper execution of technique  Manual Suboccipital release .  PROM of cervical rotation.   Gentle CPA of cervical C-3 to C6 grade 2   [facility] PT Assessment - 10/07/21 0001       AROM   Cervical - Right Rotation 70    Cervical - Left Rotation 65  PT Short Term Goals - 10/01/21 1026       PT SHORT TERM GOAL #1   Title "Independent with initial HEP    Time 2    Period Weeks    Status Achieved    Target Date 08/19/21      PT SHORT TERM GOAL #2   Title Pt will be albe to walk and sit without gaurding trunk with bil arms due to decreased neck /upper back tension.  able to walk with arm swing    Baseline no longer gaurding when walking  and walking 30 minu a day on treadmill    Time 2    Status Achieved      PT SHORT TERM GOAL #3   Title Pt will be able to sit with good posture and no compensatory leaning     Baseline Pt aware of good posture and incorporates into daily life    Time 2    Period Weeks    Status Achieved               PT Long Term Goals - 10/07/21 1116       PT LONG TERM GOAL #1   Title "Demonstrate and verbalize techniques to reduce the risk of re-injury including: lifting, posture, body mechanics.     Baseline I have used the techniques for lifting.  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    Baseline Pt 2/10 with neck turning but able to get and down from the floor; 10/01/21- has returned to getting up and down to the floor    Time 8    Period Weeks    Status Achieved    Target Date 11/11/21      PT LONG TERM GOAL #4   Title "Pt will tolerate sitting 1 hour without increased pain to ride in car without increased pain    Baseline 2 hours now but 2/10; 10/01/21- neck gets stiff with prolonged driving    Time 8    Period Weeks    Status On-going    Target Date 11/11/21      PT LONG TERM GOAL #5   Title  FOTO will improve from 59% intake   to75% intake score     indicating improved functional mobility.    Baseline eval 59% to 57% to 56% 09-16-21 slightly worse than original score    Time 6    Period Weeks    Status On-going    Target Date 11/11/21      PT LONG TERM GOAL #6   Title Pt will reduced headaches from every other day to one time a week    Baseline Pt headache returned   takeing Tylenol and use warm towel  Time 8    Period Weeks    Status On-going    Target Date 11/11/21      PT LONG TERM GOAL #7   Title Pt will be able to lift 25# overhead with bil arms and good control to show increased UE and neck strength    Baseline Pt struggle with 15 # x 3 reps overhead.    Time 8    Period Weeks    Status On-going    Target Date 11/11/21                   Plan - 10/07/21 1123     Clinical Impression Statement [name] reports return of headache but she is stronger since beginning PT.  Pt still reports pain with prolonged driving.  sleep is improved since her neck seems to have more AROM. Pt was able to lift 20# overhead press and 10 lb in bil hands (total 20#)  bench press today.  Pt has increased ability to exercise with higher weights. Pt is slowly increasing capacity. No goals achieved this week but making progress toward lifting greater weights.    Examination-Activity Limitations Carry;Dressing;Lift    Examination-Participation Restrictions Cleaning;Occupation;Laundry    PT Treatment/Interventions ADLs/Self Care Home Management;Cryotherapy;Electrical Stimulation;Iontophoresis 4mg /ml Dexamethasone;Moist Heat;Traction;Neuromuscular re-education;Therapeutic exercise;Therapeutic activities;Functional mobility training;Patient/family education;Manual techniques;Taping;Dry needling;Passive range of motion;Spinal Manipulations;Joint Manipulations    PT Next Visit Plan progress DNF strength and lifting as able . Manual as needed ;    PT Home Exercise Plan [id number]    Consulted and Agree with Plan of Care Patient             Patient will benefit from skilled therapeutic intervention in order to improve the following deficits and impairments:  Pain, Improper body mechanics, Postural dysfunction, Impaired UE functional use, Increased muscle spasms, Decreased activity tolerance, Decreased range of motion, Decreased strength, Other (comment)  Visit Diagnosis: Cervicalgia  Pain in thoracic spine  Cramp and spasm  Muscle weakness (generalized)     Problem List Patient Active Problem List   Diagnosis Date Noted   Headache 08/11/2021   Menopause 12/29/2019   HYPERCHOLESTEROLEMIA 05/21/2009   Essential hypertension 05/21/2009   GERD 05/07/2008   Voncille Lo, PT, [place] Certified Exercise Expert for the Aging Adult  10/07/21 1:52 PM Phone: [phone] Fax: [facility] [address] [place], [place], [address] Phone: [phone]   Fax:  [phone]  Name: Sara Day MRN: [id number] Date of Birth: 02-01-1966

## 2021-10-16 ENCOUNTER — Encounter: Payer: BLUE CROSS/BLUE SHIELD | Admitting: Physical Therapy

## 2021-10-21 ENCOUNTER — Ambulatory Visit: Payer: BLUE CROSS/BLUE SHIELD | Admitting: Physical Therapy

## 2021-10-28 ENCOUNTER — Ambulatory Visit: Payer: BLUE CROSS/BLUE SHIELD | Attending: Family Medicine | Admitting: Physical Therapy

## 2021-10-28 ENCOUNTER — Other Ambulatory Visit: Payer: Self-pay

## 2021-10-28 DIAGNOSIS — M546 Pain in thoracic spine: Secondary | ICD-10-CM | POA: Insufficient documentation

## 2021-10-28 DIAGNOSIS — R252 Cramp and spasm: Secondary | ICD-10-CM | POA: Diagnosis present

## 2021-10-28 DIAGNOSIS — M542 Cervicalgia: Secondary | ICD-10-CM | POA: Diagnosis not present

## 2021-10-28 DIAGNOSIS — M6281 Muscle weakness (generalized): Secondary | ICD-10-CM | POA: Diagnosis present

## 2021-10-28 NOTE — Therapy (Signed)
[place], [place], [address] Phone: [phone]   Fax:  [phone]  Physical Therapy Treatment  Patient Details  Name: Sara Day MRN: [id number] Date of Birth: [birth year] Referring Provider (PT): Andrena Mews MD   Encounter Date: 10/28/2021   PT End of Session - 10/28/21 1202     Visit Number 14    Number of Visits 18    Date for PT Re-Evaluation 11/11/21    Authorization Type Med Pay /BCBS    PT Start Time 1107    PT Stop Time 1155    PT Time Calculation (min) 48 min    Activity Tolerance Patient tolerated treatment well    Behavior During Therapy [facility] for tasks assessed/performed             Past Medical History:  Diagnosis Date   Asthma    Breast mass    Hypertension     Past Surgical History:  Procedure Laterality Date   BREAST EXCISIONAL BIOPSY      There were no vitals filed for this visit.   Subjective Assessment - 10/28/21 1138     Subjective 1/10 pain today. I am doing better today.  My fiance got me some weights.  I got 2 15 lb weight. My daughter got me a Production assistant, radio for my neck, When I am driving it gets to me when I try to turn it to the left bothers me a bit I am sleeping well now and headaches are more rare. I still have pain when driving and turning to the left    Pertinent History HTN, GERD, benign breast bx    Limitations Lifting    Diagnostic tests no xrays    Patient Stated Goals want to feel better and be more active with my day care "babie"    Currently in Pain? Yes    Pain Score 1     Pain Location Neck    Pain Orientation Left    Pain Descriptors / Indicators Aching    Pain Onset More than a month ago             Therapeutic Exercise: UBE level 2 x 2 min each way Level 3.0 - PT VC for chest up/ chin down  Standing OH press with 15 lb 1 x 10  then 20lb 2 x 10 Bil ER with green t band  3 x 10 Biceps 2 x 10 with 10 lb Plank on elbows for 17 sec  and 15 sec. Standing chin tuck  with red TB bil scaption and abd 2 x 10 each bil Simulated bench press with dowel rod 3 x 10  Supine neck chin tuck with rotation 5 x to R and to L Standing horizontal abd/ diagonal start pattern with green t band, showed pt how to do exercise with thumbs up to decrease IR of shld and prevent irritation Supine- bench press using DB 2 10 lb   3 x 10 reps Towel rotation cervical rotation to L and R VC and TC for proper execution of technique   Manual Suboccipital release .  PROM of cervical rotation.  PROM of cervical extension off edge of mat for reset and with rotation  Gentle CPA of cervical C-3 to C6 grade 2 especially C-4 and C5 on left.  Use of theracane on tender left neck areas.       [facility] PT Assessment - 10/28/21 0001       AROM  Cervical - Right Rotation 70    Cervical - Left Rotation 61   TTP on C- 4 /C-5 on L                                     PT Short Term Goals - 10/01/21 1026       PT SHORT TERM GOAL #1   Title "Independent with initial HEP    Time 2    Period Weeks    Status Achieved    Target Date 08/19/21      PT SHORT TERM GOAL #2   Title Pt will be albe to walk and sit without gaurding trunk with bil arms due to decreased neck /upper back tension.  able to walk with arm swing    Baseline no longer gaurding when walking  and walking 30 minu a day on treadmill    Time 2    Status Achieved      PT SHORT TERM GOAL #3   Title Pt will be able to sit with good posture and no compensatory leaning     Baseline Pt aware of good posture and incorporates into daily life    Time 2    Period Weeks    Status Achieved               PT Long Term Goals - 10/28/21 1159       PT LONG TERM GOAL #1   Title "Demonstrate and verbalize techniques to reduce the risk of re-injury including: lifting, posture, body mechanics.     Baseline I have used the techniques for lifting.  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    Baseline Pt 2/10 with neck turning but able to get and down from the floor; 10/01/21- has returned to getting up and down to the floor 10-28-21 1/10 pain level    Time 8    Period Weeks    Status Achieved    Target Date 11/11/21      PT LONG TERM GOAL #4   Title "Pt will tolerate sitting 1 hour without increased pain to ride in car without increased pain    Baseline 2 hours now but 2/10; 10/01/21- neck gets stiff with prolonged driving 4-33-29 Pt 5/18 today but complains about turning neck to the Left 61 degrees rotation    Time 8    Period Weeks    Status On-going    Target Date 11/11/21      PT LONG TERM GOAL #5   Title  FOTO will improve from 59% intake   to75% intake score     indicating improved functional mobility.  Baseline eval 59% to 57% to 56% 09-16-21 slightly worse than original score    Time 6    Period Weeks    Status On-going    Target Date 11/11/21      PT LONG TERM GOAL #6   Title Pt will reduced headaches from every other day to one time a week    Baseline Pt with  headached rarely now    Time 8    Period Weeks    Status Achieved    Target Date 11/11/21      PT LONG TERM GOAL #7   Title Pt will be able to lift 25# overhead with bil arms and good control to show increased UE and neck strength    Baseline Pt able to raise # 20 overhead    Time 8    Period Weeks    Status On-going    Target Date 11/11/21                   Plan - 10/28/21  1202     Examination-Activity Limitations Carry;Dressing;Lift    Examination-Participation Restrictions Cleaning;Occupation;Laundry    PT Frequency 1x / week    PT Duration 8 weeks    PT Treatment/Interventions ADLs/Self Care Home Management;Cryotherapy;Electrical Stimulation;Iontophoresis 4mg /ml Dexamethasone;Moist Heat;Traction;Neuromuscular re-education;Therapeutic exercise;Therapeutic activities;Functional mobility training;Patient/family education;Manual techniques;Taping;Dry needling;Passive range of motion;Spinal Manipulations;Joint Manipulations    PT Next Visit Plan progress DNF strength and lifting as able . Manual as needed ; using gym equipment  DO FOTO next visit and lift 25#    PT Home Exercise Plan [id number]    Consulted and Agree with Plan of Care Patient             Patient will benefit from skilled therapeutic intervention in order to improve the following deficits and impairments:  Pain, Improper body mechanics, Postural dysfunction, Impaired UE functional use, Increased muscle spasms, Decreased activity tolerance, Decreased range of motion, Decreased strength, Other (comment)  Visit Diagnosis: Cervicalgia  Pain in thoracic spine  Cramp and spasm  Muscle weakness (generalized)     Problem List Patient Active Problem List   Diagnosis Date Noted   Headache 08/11/2021   Menopause 12/29/2019   HYPERCHOLESTEROLEMIA 05/21/2009   Essential hypertension 05/21/2009   GERD 05/07/2008    Dorothea Ogle, PT 10/28/2021, 12:11 PM  [facility] [address] [place], [place], [address] Phone: [phone]   Fax:  [phone]  Name: Sara Day MRN: [id number] Date of Birth: June 07, 1966

## 2021-11-04 ENCOUNTER — Other Ambulatory Visit: Payer: Self-pay

## 2021-11-04 ENCOUNTER — Encounter: Payer: Self-pay | Admitting: Physical Therapy

## 2021-11-04 ENCOUNTER — Ambulatory Visit: Payer: BLUE CROSS/BLUE SHIELD | Admitting: Physical Therapy

## 2021-11-04 DIAGNOSIS — M542 Cervicalgia: Secondary | ICD-10-CM | POA: Diagnosis not present

## 2021-11-04 DIAGNOSIS — R252 Cramp and spasm: Secondary | ICD-10-CM

## 2021-11-04 DIAGNOSIS — M6281 Muscle weakness (generalized): Secondary | ICD-10-CM

## 2021-11-04 DIAGNOSIS — M546 Pain in thoracic spine: Secondary | ICD-10-CM

## 2021-11-04 NOTE — Therapy (Signed)
[place], [place], [address] Phone: [phone]   Fax:  [phone]  Physical Therapy Treatment  Patient Details  Name: Sara Day MRN: [id number] Date of Birth: 09-24-1966 Referring Provider (PT): Sara Mews MD   Encounter Date: 11/04/2021   PT End of Session - 11/04/21 1134     Visit Number 15    Number of Visits 18    Date for PT Re-Evaluation 11/11/21    Authorization Type Med Pay /BCBS    PT Start Time 1104    PT Stop Time 1143    PT Time Calculation (min) 39 min             Past Medical History:  Diagnosis Date   Asthma    Breast mass    Hypertension     Past Surgical History:  Procedure Laterality Date   BREAST EXCISIONAL BIOPSY      There were no vitals filed for this visit.   Subjective Assessment - 11/04/21 1109     Subjective My daugher got my neck massageer and I love it with my cheetah print.  I have no pain today.  My weekend went well and I took care of my grand daughters    Pertinent History HTN, GERD, benign breast bx    Limitations Lifting    How long can you sit comfortably? 2 hours    How long can you stand comfortably? 2 hours    How long can you walk comfortably? 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"    Currently in Pain? No/denies    Pain Score 0-No pain    Pain Location Neck    Pain Orientation Left    Pain Type Chronic pain    Pain Onset More than a month ago             Therapeutic Exercise: UBE level 2 x 2 min each way Level 3.0 - PT VC for chest up/ chin down  Standing OH press with 20 lb 1 x 10  then 25lb 2 x 10 Bil ER with blue t band  3 x 10 with towel rolls at elbows Bil scaption and abduction 3 x 10  with BTB Biceps 2 x 10 with 10 lb Plank on elbows for 19sec and 15 sec. Standing chin tuck  with red  TB bil scaption and abd 2 x 10 each bil Simulated bench press with dowel rod 3 x 10  Supine neck chin tuck with rotation 5 x to R and to L Standing horizontal abd/ diagonal start pattern with green t band, showed pt how to do exercise with thumbs up to decrease IR of shld and prevent irritation Supine- bench press using DB 2 12 lb   3 x 10 reps Farmers carry 45 lb 3 feet x 2  challenging       [facility] PT Assessment - 11/04/21 0001       Observation/Other Assessments   Focus on Therapeutic Outcomes (FOTO)  73% status      AROM   Cervical - Right Rotation 70    Cervical - Left Rotation 65  PT Short Term Goals - 10/01/21 1026       PT SHORT TERM GOAL #1   Title "Independent with initial HEP    Time 2    Period Weeks    Status Achieved    Target Date 08/19/21      PT SHORT TERM GOAL #2   Title Pt will be albe to walk and sit without gaurding trunk with bil arms due to decreased neck /upper back tension.  able to walk with arm swing    Baseline no longer gaurding when walking  and walking 30 minu a day on treadmill    Time 2    Status Achieved      PT SHORT TERM GOAL #3   Title Pt will be able to sit with good posture and no compensatory leaning     Baseline Pt aware of good posture and incorporates into daily life    Time 2    Period Weeks    Status Achieved               PT Long Term Goals - 11/04/21 1129       PT LONG TERM GOAL #1   Title "Demonstrate and verbalize techniques to reduce the risk of re-injury including: lifting, posture, body mechanics.     Baseline I have used the techniques for lifting.  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    Baseline Pt 2/10 with neck turning but able to get and down from the floor; 10/01/21- has returned to getting up and down to the floor 10-28-21 1/10 pain level    Time 8    Period Weeks    Status Achieved    Target Date 11/11/21      PT LONG TERM GOAL #4   Title "Pt will tolerate sitting 1 hour without increased pain to ride in car without increased pain    Baseline 2 hours now but 2/10; 10/01/21- neck gets stiff with prolonged driving 11-11-56 Pt 0/99 today but complains about turning neck to the Left 61 degrees rotation  11-04-21  65 degrees    Time 8    Period Weeks    Status Achieved    Target Date 11/11/21      PT LONG TERM GOAL #5   Title  FOTO will improve from 59% intake   to75% intake score     indicating improved functional mobility.    Baseline eval 59% to 57% to 56% 09-16-21 slightly worse than original score  11-04-21  73%    Time 6    Period Weeks    Status Achieved    Target Date 11/11/21      PT LONG TERM GOAL #6   Title Pt will reduced headaches from every other  day to one time a week    Baseline No headaches 11-04-21    Time 8    Period Weeks    Status Achieved    Target Date 11/11/21      PT LONG TERM GOAL #7   Title Pt will be able to lift 25# overhead with bil arms and good control to show increased UE and neck strength    Baseline Pt able to raise # 25 overhead x 5 but with some challenged.    Time 8    Period Weeks    Status Partially Met    Target Date 11/11/21                   Plan - 11/04/21 1301     Clinical Impression Statement All LTG complete except for for partialy met OH press of 25#  Pt will attend 1 additional appt to reinforce HEP and progress strength at home.  [name] does not have any pain today.  and  has increased cervical rotation.  Pt FOTO improved to 73% and achieved goal  Pt is ready for DC after next and final appt.  She has been progressing well and is building strength reserve for home and taking care of children with no issues. She generally will benefit from whole body strengthening at a communitiy wellness/ gym    Examination-Activity Limitations Carry;Dressing;Lift    Examination-Participation Restrictions Cleaning;Occupation;Laundry    PT Frequency 1x / week    PT Duration 8 weeks    PT Treatment/Interventions ADLs/Self Care Home Management;Cryotherapy;Electrical Stimulation;Iontophoresis 23m/ml Dexamethasone;Moist Heat;Traction;Neuromuscular re-education;Therapeutic exercise;Therapeutic activities;Functional mobility training;Patient/family education;Manual techniques;Taping;Dry needling;Passive range of motion;Spinal Manipulations;Joint Manipulations    PT Next Visit Plan DC next visit  gym equipment    PT Home Exercise Plan [id number]    Consulted and Agree with Plan of Care Patient             Patient will benefit from skilled therapeutic intervention in order to improve the following deficits and impairments:  Pain, Improper body mechanics, Postural dysfunction, Impaired UE functional use, Increased muscle spasms, Decreased activity tolerance, Decreased range of motion, Decreased strength, Other (comment)  Visit Diagnosis: Cervicalgia  Pain in thoracic spine  Cramp and spasm  Muscle weakness (generalized)     Problem List Patient Active Problem List   Diagnosis Date Noted   Headache 08/11/2021   Menopause 12/29/2019   HYPERCHOLESTEROLEMIA 05/21/2009   Essential hypertension 05/21/2009   GERD 05/07/2008    LVoncille Lo PT, [place]annaCertified Exercise Expert for the Aging Adult  11/04/21 1:08 PM Phone: [phone]: [facility] [address]Phone:  [phone]  Fax:  [phone] Name: Sara FRUCHTERMRN: [id number] of Birth: [birth year]

## 2021-11-06 ENCOUNTER — Other Ambulatory Visit: Payer: Self-pay | Admitting: Family Medicine

## 2021-11-06 DIAGNOSIS — I10 Essential (primary) hypertension: Secondary | ICD-10-CM

## 2021-11-06 NOTE — Telephone Encounter (Signed)
Needs follow up for BP

## 2021-11-11 ENCOUNTER — Other Ambulatory Visit: Payer: Self-pay

## 2021-11-11 ENCOUNTER — Ambulatory Visit: Payer: BLUE CROSS/BLUE SHIELD | Admitting: Physical Therapy

## 2021-11-11 ENCOUNTER — Other Ambulatory Visit: Payer: Self-pay | Admitting: Student in an Organized Health Care Education/Training Program

## 2021-11-11 ENCOUNTER — Encounter: Payer: Self-pay | Admitting: Physical Therapy

## 2021-11-11 DIAGNOSIS — M546 Pain in thoracic spine: Secondary | ICD-10-CM

## 2021-11-11 DIAGNOSIS — M6281 Muscle weakness (generalized): Secondary | ICD-10-CM

## 2021-11-11 DIAGNOSIS — R252 Cramp and spasm: Secondary | ICD-10-CM

## 2021-11-11 DIAGNOSIS — M542 Cervicalgia: Secondary | ICD-10-CM | POA: Diagnosis not present
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
## 2021-11-22 ENCOUNTER — Other Ambulatory Visit: Payer: Self-pay | Admitting: Student in an Organized Health Care Education/Training Program

## 2021-11-24 ENCOUNTER — Other Ambulatory Visit: Payer: Self-pay | Admitting: Family Medicine

## 2021-11-24 ENCOUNTER — Telehealth: Payer: Self-pay

## 2021-11-24 DIAGNOSIS — I10 Essential (primary) hypertension: Secondary | ICD-10-CM

## 2021-11-24 NOTE — Telephone Encounter (Signed)
Patient calls nurse line reporting difficulties picking up HCTZ. I called the pharmacy and the patient never picked up and therefore they put it back on the shelf.   Will forward to PCP to fill.

## 2021-11-25 MED ORDER — HYDROCHLOROTHIAZIDE 12.5 MG PO TABS: ORAL_TABLET | ORAL | 1 refills | Status: DC

## 2021-11-25 NOTE — Telephone Encounter (Signed)
New rx of HCTZ sent to pharmacy.   [place], DO 11/25/2021, 8:32 AM PGY-3, [place]

## 2021-12-11 ENCOUNTER — Telehealth: Payer: Self-pay

## 2021-12-11 ENCOUNTER — Encounter: Payer: Self-pay | Admitting: Family Medicine

## 2021-12-11 ENCOUNTER — Other Ambulatory Visit: Payer: Self-pay

## 2021-12-11 ENCOUNTER — Ambulatory Visit (INDEPENDENT_AMBULATORY_CARE_PROVIDER_SITE_OTHER): Payer: BLUE CROSS/BLUE SHIELD | Admitting: Family Medicine

## 2021-12-11 DIAGNOSIS — I10 Essential (primary) hypertension: Secondary | ICD-10-CM

## 2021-12-11 MED ORDER — HYDROCHLOROTHIAZIDE 12.5 MG PO TABS: 25.0000 mg | ORAL_TABLET | Freq: Every day | ORAL | 1 refills | Status: DC

## 2021-12-11 MED ORDER — HYDROCHLOROTHIAZIDE 25 MG PO TABS: 25.0000 mg | ORAL_TABLET | Freq: Every day | ORAL | 3 refills | Status: DC

## 2021-12-11 NOTE — Progress Notes (Signed)
° ° °  SUBJECTIVE:   CHIEF COMPLAINT / HPI:   Hypertension - Medications: HCTZ 25 mg daily - Compliance: Yes - Checking BP at home: Attempting, does not know how to se machine - Denies any SOB, CP, vision changes, LE edema, medication SEs, or symptoms of hypotension - Diet: Had salty chips prior to visit  PERTINENT  PMH / PSH: HLD  OBJECTIVE:   BP (!) 160/94    Pulse 60    Ht 5\' 1"  (1.549 m)    Wt 149 lb 12.8 oz (67.9 kg)    SpO2 100%    BMI 28.30 kg/m   Physical Exam Vitals reviewed.  Constitutional:      General: She is not in acute distress.    Appearance: She is not ill-appearing, toxic-appearing or diaphoretic.  Cardiovascular:     Rate and Rhythm: Normal rate and regular rhythm.     Heart sounds: Normal heart sounds.  Pulmonary:     Effort: Pulmonary effort is normal.     Breath sounds: Normal breath sounds.  Neurological:     Mental Status: She is alert and oriented to person, place, and time.  Psychiatric:        Mood and Affect: Mood normal.        Behavior: Behavior normal.     ASSESSMENT/PLAN:    Essential hypertension Continue current medication. Consider adding back amlodipine or second agent. Patient unsure how to use home cuff. HCTZ script for 12.5 mg and 25 mg. Patient endorsing medication is 25 mg daily. Asked to bring meds and cuff to follow up in 1-2 weeks.  - hydrochlorothiazide (HYDRODIURIL) 25 MG tablet; Take 1 tablet (25 mg total) by mouth daily.  Dispense: 90 tablet; Refill: [place]

## 2021-12-11 NOTE — Telephone Encounter (Signed)
Received phone call from pharmacist regarding clarification needed on HCTZ order. Current rx has two sets of directions.  Current directions states for patient to take 2 tablets (25 mg total) daily. Take 1 tablet (12.5 mg) by mouth daily.   If patient is supposed to be on 25 mg daily, please send in new rx for 25 mg tablets, as quantity limit would be exceeded taking 2, 12.5 mg tablets daily.   Please clarify and send in new prescription.   Talbot Grumbling, RN

## 2021-12-11 NOTE — Patient Instructions (Addendum)
It was wonderful to see you today.  Please bring ALL of your medications with you to every visit.   Today we talked about:  High blood pressure-continue current medications, bring your medications and blood pressure machine at follow up in 2 weeks.   Please be sure to schedule follow up at the front  desk before you leave today.   If you haven't already, sign up for My Chart to have easy access to your labs results, and communication with your primary care physician.  Please call the clinic at [phone] if your symptoms worsen or you have any concerns. It was our pleasure to serve you.  Dr. Janus Molder

## 2022-03-21 ENCOUNTER — Ambulatory Visit (HOSPITAL_COMMUNITY)
Admission: EM | Admit: 2022-03-21 | Discharge: 2022-03-21 | Disposition: A | Discharge status: Home / Self Care | Payer: 59 | Attending: Family Medicine | Admitting: Family Medicine

## 2022-03-21 ENCOUNTER — Ambulatory Visit (INDEPENDENT_AMBULATORY_CARE_PROVIDER_SITE_OTHER): Payer: 59

## 2022-03-21 ENCOUNTER — Encounter (HOSPITAL_COMMUNITY): Payer: Self-pay | Admitting: Emergency Medicine

## 2022-03-21 DIAGNOSIS — I10 Essential (primary) hypertension: Secondary | ICD-10-CM | POA: Insufficient documentation

## 2022-03-21 DIAGNOSIS — M25561 Pain in right knee: Secondary | ICD-10-CM

## 2022-03-21 LAB — COMPREHENSIVE METABOLIC PANEL
ALT: 15 U/L (ref 0–44)
AST: 16 U/L (ref 15–41)
Albumin: 3.5 g/dL (ref 3.5–5.0)
Alkaline Phosphatase: 61 U/L (ref 38–126)
Anion gap: 6 (ref 5–15)
BUN: 11 mg/dL (ref 6–20)
CO2: 28 mmol/L (ref 22–32)
Calcium: 9.3 mg/dL (ref 8.9–10.3)
Chloride: 106 mmol/L (ref 98–111)
Creatinine, Ser: 0.81 mg/dL (ref 0.44–1.00)
GFR, Estimated: >60 mL/min (ref >60)
Glucose, Bld: 102 mg/dL — ABNORMAL HIGH (ref 70–99)
Potassium: 3.5 mmol/L (ref 3.5–5.1)
Sodium: 140 mmol/L (ref 135–145)
Total Bilirubin: 1.4 mg/dL — ABNORMAL HIGH (ref 0.3–1.2)
Total Protein: 7.2 g/dL (ref 6.5–8.1)

## 2022-03-21 LAB — CBC
HCT: 39.5 % (ref 36.0–46.0)
Hemoglobin: 13.1 g/dL (ref 12.0–15.0)
MCH: 29.5 pg (ref 26.0–34.0)
MCHC: 33.2 g/dL (ref 30.0–36.0)
MCV: 89 fL (ref 80.0–100.0)
Platelets: 251 10*3/uL (ref 150–400)
RBC: 4.44 MIL/uL (ref 3.87–5.11)
RDW: 12.8 % (ref 11.5–15.5)
WBC: 5.9 10*3/uL (ref 4.0–10.5)
nRBC: 0 % (ref 0.0–0.2)

## 2022-03-21 MED ORDER — AMLODIPINE BESYLATE 5 MG PO TABS: 5.0000 mg | ORAL_TABLET | Freq: Every day | ORAL | 1 refills | Status: DC

## 2022-03-21 MED ORDER — HYDROCHLOROTHIAZIDE 25 MG PO TABS
25.0000 mg | ORAL_TABLET | Freq: Every day | ORAL | 1 refills | Status: DC
Start: 2022-03-21 — End: 2023-05-05

## 2022-03-21 NOTE — ED Provider Notes (Signed)
[name]    CSN: [id number] Arrival date & time: 03/21/22  1458      History   Chief Complaint Chief Complaint  Patient presents with   Joint Swelling    HPI Sara Day is a 56 y.o. female.   Patient presents today with a several week history of right knee pain.  Denies any known injury or increase in activity prior to symptom onset.  Reports pain is rated 4 at rest but increases to 6 with attempted ambulation, localized to anterior right knee, described as aching with periodic sharp pains, worse with ambulation, no alleviating factors notified.  She has tried Tylenol, elevation, ice with minimal improvement of symptoms.  She denies previous injury or surgery involving knee.  Denies any weakness, numbness, paresthesias, popping, clicking.  Reports pain is worse with ascending/descending stairs.  Denies any significant leg swelling, chest pain, palpitations, shortness of breath.  Denies history of osteoarthritis, rheumatoid arthritis, gout.  In addition, patient was noted to have elevated blood pressure today.  She has not taken her blood pressure medication in a few days.  She denies any chest pain, shortness of breath, headache, vision change, dizziness.  She does report eating a significant mount of sodium but denies any NSAID or decongestant use.   Past Medical History:  Diagnosis Date   Asthma    Breast mass    Hypertension     Patient Active Problem List   Diagnosis Date Noted   Headache 08/11/2021   Menopause 12/29/2019   HYPERCHOLESTEROLEMIA 05/21/2009   Essential hypertension 05/21/2009   GERD 05/07/2008    Past Surgical History:  Procedure Laterality Date   BREAST EXCISIONAL BIOPSY      OB History   No obstetric history on file.      Home Medications    Prior to Admission medications   Medication Sig Start Date End Date Taking? Authorizing Provider  albuterol (VENTOLIN HFA) 108 (90 Base) MCG/ACT inhaler INHALE 2 PUFFS INTO THE LUNGS  EVERY 4 HOURS AS NEEDED FOR WHEEZING 11/25/21   Autry-Lott, Naaman Plummer, DO  amLODipine (NORVASC) 5 MG tablet Take 1 tablet (5 mg total) by mouth at bedtime. 03/21/22   Majel Giel, Derry Skill, PA-C  ciprofloxacin (CILOXAN) 0.3 % ophthalmic solution Place 2 drops into the right eye every 6 (six) hours. 09/04/21   Scot Jun, FNP  hydrochlorothiazide (HYDRODIURIL) 25 MG tablet Take 1 tablet (25 mg total) by mouth daily. 03/21/22   Evangelynn Lochridge, Derry Skill, PA-C  loratadine (CLARITIN) 10 MG tablet Take 1 tablet (10 mg total) by mouth daily. Patient not taking: Reported on 08/11/2021 01/23/21   Richarda Osmond, MD  meloxicam (MOBIC) 15 MG tablet TAKE 1 TABLET(15 MG) BY MOUTH DAILY 07/28/21   Autry-Lott, Naaman Plummer, DO  triamcinolone cream (KENALOG) 0.1 % Apply 1 application topically 2 (two) times daily. Patient not taking: Reported on 08/11/2021 05/18/21   Teodora Medici, FNP    Family History Family History  Problem Relation Age of Onset   Heart attack Father    Colon cancer Neg Hx    Colon polyps Neg Hx    Esophageal cancer Neg Hx    Rectal cancer Neg Hx    Stomach cancer Neg Hx     Social History Social History   Tobacco Use   Smoking status: Never   Smokeless tobacco: Never  Vaping Use   Vaping Use: Never used  Substance Use Topics   Alcohol use: Yes    Alcohol/week: 1.0 standard  drink    Types: 1 Glasses of wine per week   Drug use: No     Allergies   Clindamycin/lincomycin, Codeine, Metronidazole, Sulfa antibiotics, and Sulfamethoxazole   Review of Systems Review of Systems  Constitutional:  Positive for activity change. Negative for appetite change, fatigue and fever.  Eyes:  Negative for visual disturbance.  Respiratory:  Negative for cough and shortness of breath.   Cardiovascular:  Negative for chest pain.  Musculoskeletal:  Positive for arthralgias, gait problem and joint swelling. Negative for myalgias.  Skin:  Negative for color change.  Neurological:  Negative for dizziness,  weakness, light-headedness, numbness and headaches.    Physical Exam Triage Vital Signs ED Triage Vitals  Enc Vitals Group     BP 03/21/22 1516 (!) 183/96     Pulse Rate 03/21/22 1516 76     Resp 03/21/22 1516 16     Temp 03/21/22 1516 98.1 F (36.7 C)     Temp Source 03/21/22 1516 Oral     SpO2 03/21/22 1516 96 %     Weight --      Height --      Head Circumference --      Peak Flow --      Pain Score 03/21/22 1515 4     Pain Loc --      Pain Edu? --      Excl. in [place]? --    No data found.  Updated Vital Signs BP (!) 190/91   Pulse 78   Temp 98.1 F (36.7 C) (Oral)   Resp 16   SpO2 96%   Visual Acuity Right Eye Distance:   Left Eye Distance:   Bilateral Distance:    Right Eye Near:   Left Eye Near:    Bilateral Near:     Physical Exam Vitals reviewed.  Constitutional:      General: She is awake. She is not in acute distress.    Appearance: Normal appearance. She is well-developed. She is not ill-appearing.     Comments: Very pleasant female appears stated age in no acute distress sitting comfortably in exam room  HENT:     Head: Normocephalic and atraumatic.  Cardiovascular:     Rate and Rhythm: Normal rate and regular rhythm.     Heart sounds: Normal heart sounds, S1 normal and S2 normal. No murmur heard. Pulmonary:     Effort: Pulmonary effort is normal.     Breath sounds: Normal breath sounds. No wheezing, rhonchi or rales.     Comments: Clear to auscultation bilaterally Musculoskeletal:     Right knee: Swelling present. No effusion. Decreased range of motion. Tenderness present over the medial joint line and lateral joint line. No LCL laxity, MCL laxity, ACL laxity or PCL laxity.     Instability Tests: Anterior drawer test negative. Posterior drawer test negative.     Right lower leg: No edema.     Left lower leg: No edema.     Comments: Right knee: Tenderness palpation at inferior joint line.  No deformity noted.  Normal gait.  No ligamentous laxity  on exam.  Psychiatric:        Behavior: Behavior is cooperative.     UC Treatments / Results  Labs (all labs ordered are listed, but only abnormal results are displayed) Labs Reviewed  COMPREHENSIVE METABOLIC PANEL - Abnormal; Notable for the following components:      Result Value   Glucose, Bld 102 (*)    Total Bilirubin  1.4 (*)    All other components within normal limits  CBC    EKG   Radiology DG Knee Complete 4 Views Right  Result Date: 03/21/2022 CLINICAL DATA:  Right knee pain/aching x 1 week. Last night noticed swelling. Is on her feet most days while working. Denies any injury EXAM: RIGHT KNEE - COMPLETE 4+ VIEW COMPARISON:  None Available. FINDINGS: No evidence of fracture, dislocation, or joint effusion. No evidence of arthropathy or other focal bone abnormality. Soft tissues are unremarkable. IMPRESSION: Negative. Electronically Signed   By: Iven Finn M.D.   On: 03/21/2022 15:40    Procedures Procedures (including critical care time)  Medications Ordered in UC Medications - No data to display  Initial Impression / Assessment and Plan / UC Course  I have reviewed the triage vital signs and the nursing notes.  Pertinent labs & imaging results that were available during my care of the patient were reviewed by me and considered in my medical decision making (see chart for details).     X-ray obtained of knee which showed no osseous abnormality.  Recommended conservative treatment measures including RICE protocol.  Patient was placed in brace for comfort and support.  Recommended Tylenol for pain.  She is to avoid NSAIDs due to elevated blood pressure (see below).  Discussed that if symptoms or not improving quickly she should follow-up with her PCP to consider referral to sports medicine/orthopedics for further evaluation and management.  Discussed alarm symptoms that warrant emergent evaluation.  Strict return precautions given.  Work excuse note  provided.  Blood pressure is very elevated today.  CBC and CMP normal with no evidence of abnormal kidney function.  Patient reports that she is in without her medication and so refills of amlodipine and hydrochlorothiazide sent to pharmacy.  She is to avoid NSAIDs, caffeine, sodium, decongestants.  Recommended follow-up with PCP or our clinic within a week for recheck.  Discussed that if she develops any chest pain, shortness of breath, headache, vision change in the setting of high blood pressure she is to go to the emergency room to which she expressed understanding.  Final Clinical Impressions(s) / UC Diagnoses   Final diagnoses:  Acute pain of right knee  Elevated blood pressure reading with diagnosis of hypertension     Discharge Instructions      Your x-ray was normal.  Keep your knee elevated and use ice.  Continue with your brace to help provide comfort and support.  I do recommend that you avoid strenuous activity.  Use Tylenol for pain relief.  If your symptoms or not improving please follow-up with sports medicine; call to schedule an appointment.  Your blood pressure is very elevated today.  I have refilled your blood pressure medication.  I will contact you if your lab work is abnormal and we need to change your treatment plan.  Please avoid NSAIDs including aspirin, ibuprofen/Advil, naproxen/Aleve.  Avoid sodium, caffeine, decongestants.  If you develop any chest pain, shortness of breath, headache, vision change in the setting of high blood pressure you need to go to the emergency room.  Follow-up with either our clinic or primary care within a few weeks for recheck.     ED Prescriptions     Medication Sig Dispense Auth. Provider   amLODipine (NORVASC) 5 MG tablet Take 1 tablet (5 mg total) by mouth at bedtime. 30 tablet Dynasty Holquin K, PA-C   hydrochlorothiazide (HYDRODIURIL) 25 MG tablet Take 1 tablet (25 mg total) by  mouth daily. 30 tablet Deloris Mittag, Derry Skill, PA-C       PDMP not reviewed this encounter.   Terrilee Croak, PA-C 03/21/22 1709

## 2022-03-21 NOTE — ED Triage Notes (Signed)
Right knee pain/aching x 1 week. Last night noticed swelling. Is on her feet most days while working. Denies any injury

## 2022-03-21 NOTE — Discharge Instructions (Signed)
Your x-ray was normal.  Keep your knee elevated and use ice.  Continue with your brace to help provide comfort and support.  I do recommend that you avoid strenuous activity.  Use Tylenol for pain relief.  If your symptoms or not improving please follow-up with sports medicine; call to schedule an appointment.  Your blood pressure is very elevated today.  I have refilled your blood pressure medication.  I will contact you if your lab work is abnormal and we need to change your treatment plan.  Please avoid NSAIDs including aspirin, ibuprofen/Advil, naproxen/Aleve.  Avoid sodium, caffeine, decongestants.  If you develop any chest pain, shortness of breath, headache, vision change in the setting of high blood pressure you need to go to the emergency room.  Follow-up with either our clinic or primary care within a few weeks for recheck.

## 2022-04-06 ENCOUNTER — Encounter (HOSPITAL_COMMUNITY): Payer: Self-pay

## 2022-04-06 ENCOUNTER — Ambulatory Visit (HOSPITAL_COMMUNITY)
Admission: EM | Admit: 2022-04-06 | Discharge: 2022-04-06 | Disposition: A | Discharge status: Home / Self Care | Payer: 59 | Attending: Emergency Medicine | Admitting: Emergency Medicine

## 2022-04-06 DIAGNOSIS — T148XXA Other injury of unspecified body region, initial encounter: Secondary | ICD-10-CM

## 2022-04-06 DIAGNOSIS — S46812A Strain of other muscles, fascia and tendons at shoulder and upper arm level, left arm, initial encounter: Secondary | ICD-10-CM

## 2022-04-06 MED ORDER — KETOROLAC TROMETHAMINE 30 MG/ML IJ SOLN
30.0000 mg | Freq: Once | INTRAMUSCULAR | Status: AC
  Administered 2022-04-06: 30 mg via INTRAMUSCULAR

## 2022-04-06 MED ORDER — MELOXICAM 15 MG PO TABS: 7.5000 mg | ORAL_TABLET | Freq: Every day | ORAL | 0 refills | Status: DC

## 2022-04-06 MED ORDER — KETOROLAC TROMETHAMINE 30 MG/ML IJ SOLN
INTRAMUSCULAR | Status: AC
  Filled 2022-04-06: qty 1

## 2022-04-06 MED ORDER — CYCLOBENZAPRINE HCL 10 MG PO TABS: 10.0000 mg | ORAL_TABLET | Freq: Every day | ORAL | 0 refills | Status: AC

## 2022-04-06 NOTE — ED Triage Notes (Signed)
Sharp and shooting pain in the left arm for 1-2 weeks. No known injuries or falls. Patient has been doing new exercise but noticed the pain at a later time. Patient denies chest pain.   Pain in the left shoulder and into the upper back.

## 2022-04-06 NOTE — Discharge Instructions (Signed)
Your pain is most likely caused by irritation to the muscles .   Starting tomorrow take meloxicam every morning with food for 5 days then as needed, this is to reduce inflammation that occurs with muscle injury  May use Tylenol 500 to 1000 mg every 6 hours as needed while using meloxicam  You may use muscle relaxer at bedtime for additional comfort, be mindful this may make you drowsy  You may use heating pad in 15 minute intervals as needed for additional comfort,or you may find comfort in using ice in 10-15 minutes over affected area  Begin stretching affected area daily for 10 minutes as tolerated to further loosen muscles   When lying down place pillow underneath arm and behind back and neck for support  Can try sleeping without pillow on firm mattress   If pain persist after recommended treatment or reoccurs if may be beneficial to follow up with orthopedic specialist for evaluation, this doctor specializes in the bones and can manage your symptoms long-term with options such as but not limited to imaging, medications or physical therapy

## 2022-04-06 NOTE — ED Provider Notes (Addendum)
[facility]    CSN: [id number] Arrival date & time: 04/06/22  1046      History   Chief Complaint Chief Complaint  Patient presents with   Arm Pain    HPI DEETTE REVAK is a 56 y.o. female.   Patient presents with left-sided neck pain and left upper back pain beginning 2 weeks ago.  Endorses pain has been constant, rating a 6 out of 10 described as sharp and shooting.  Pain can be felt with all range of motion of the left arm and neck.  Denies numbness, tingling, injury or trauma.  Has attempted use of Tylenol which has been effective.  Endorses that she recently started a new workout routine which consist of the treadmill and at work on the floor.     Past Medical History:  Diagnosis Date   Asthma    Breast mass    Hypertension     Patient Active Problem List   Diagnosis Date Noted   Headache 08/11/2021   Menopause 12/29/2019   HYPERCHOLESTEROLEMIA 05/21/2009   Essential hypertension 05/21/2009   GERD 05/07/2008    Past Surgical History:  Procedure Laterality Date   BREAST EXCISIONAL BIOPSY      OB History   No obstetric history on file.      Home Medications    Prior to Admission medications   Medication Sig Start Date End Date Taking? Authorizing Provider  amLODipine (NORVASC) 5 MG tablet Take 1 tablet (5 mg total) by mouth at bedtime. 03/21/22  Yes Raspet, Erin K, PA-C  hydrochlorothiazide (HYDRODIURIL) 25 MG tablet Take 1 tablet (25 mg total) by mouth daily. 03/21/22  Yes Raspet, Erin K, PA-C  loratadine (CLARITIN) 10 MG tablet Take 1 tablet (10 mg total) by mouth daily. 01/23/21  Yes Richarda Osmond, MD  meloxicam (MOBIC) 15 MG tablet TAKE 1 TABLET(15 MG) BY MOUTH DAILY 07/28/21  Yes Autry-Lott, Naaman Plummer, DO  triamcinolone cream (KENALOG) 0.1 % Apply 1 application topically 2 (two) times daily. 05/18/21  Yes Mound, Hildred Alamin E, FNP  albuterol (VENTOLIN HFA) 108 (90 Base) MCG/ACT inhaler INHALE 2 PUFFS INTO THE LUNGS EVERY 4 HOURS AS NEEDED FOR  WHEEZING 11/25/21   Autry-Lott, Naaman Plummer, DO  ciprofloxacin (CILOXAN) 0.3 % ophthalmic solution Place 2 drops into the right eye every 6 (six) hours. 09/04/21   Scot Jun, FNP    Family History Family History  Problem Relation Age of Onset   Heart attack Father    Colon cancer Neg Hx    Colon polyps Neg Hx    Esophageal cancer Neg Hx    Rectal cancer Neg Hx    Stomach cancer Neg Hx     Social History Social History   Tobacco Use   Smoking status: Never   Smokeless tobacco: Never  Vaping Use   Vaping Use: Never used  Substance Use Topics   Alcohol use: Yes    Alcohol/week: 1.0 standard drink of alcohol    Types: 1 Glasses of wine per week   Drug use: No     Allergies   Clindamycin/lincomycin, Codeine, Metronidazole, Sulfa antibiotics, and Sulfamethoxazole   Review of Systems Review of Systems  Constitutional: Negative.   Respiratory: Negative.    Cardiovascular: Negative.   Musculoskeletal:  Positive for back pain and neck pain. Negative for arthralgias, gait problem, joint swelling, myalgias and neck stiffness.  Skin: Negative.   Neurological: Negative.      Physical Exam Triage Vital Signs ED Triage Vitals  Enc Vitals Group     BP 04/06/22 1227 (!) 179/111     Pulse Rate 04/06/22 1227 (!) 57     Resp 04/06/22 1227 16     Temp 04/06/22 1227 98.2 F (36.8 C)     Temp Source 04/06/22 1227 Oral     SpO2 04/06/22 1227 95 %     Weight 04/06/22 1229 153 lb (69.4 kg)     Height 04/06/22 1229 '5\' 1"'$  (1.549 m)     Head Circumference --      Peak Flow --      Pain Score 04/06/22 1228 6     Pain Loc --      Pain Edu? --      Excl. in [place]? --    No data found.  Updated Vital Signs BP (!) 179/111 (BP Location: Left Arm)   Pulse (!) 57   Temp 98.2 F (36.8 C) (Oral)   Resp 16   Ht '5\' 1"'$  (1.549 m)   Wt 153 lb (69.4 kg)   SpO2 95%   BMI 28.91 kg/m   Visual Acuity Right Eye Distance:   Left Eye Distance:   Bilateral Distance:    Right Eye Near:    Left Eye Near:    Bilateral Near:     Physical Exam Constitutional:      Appearance: Normal appearance.  HENT:     Head: Normocephalic.  Eyes:     Extraocular Movements: Extraocular movements intact.  Neck:     Comments: Tenderness and swelling along the left lower lateral aspect of the neck extending into the superior left shoulder, no ecchymosis, swelling, deformity, rigidity, crepitus present, 2+ carotid pulse, range of motion intact Pulmonary:     Effort: Pulmonary effort is normal.  Musculoskeletal:     Comments: Tenderness is present along the left trapezius muscle without ecchymosis, swelling or deformity, no involvement of the left shoulder, negative Hawkins sign, strength 5 out of 5  Skin:    General: Skin is warm and dry.  Neurological:     Mental Status: She is alert and oriented to person, place, and time. Mental status is at baseline.  Psychiatric:        Mood and Affect: Mood normal.        Behavior: Behavior normal.      UC Treatments / Results  Labs (all labs ordered are listed, but only abnormal results are displayed) Labs Reviewed - No data to display  EKG   Radiology No results found.  Procedures Procedures (including critical care time)  Medications Ordered in UC Medications - No data to display  Initial Impression / Assessment and Plan / UC Course  I have reviewed the triage vital signs and the nursing notes.  Pertinent labs & imaging results that were available during my care of the patient were reviewed by me and considered in my medical decision making (see chart for details).  Strain of left trapezius muscle, initial encounter  Vital signs are stable, blood pressure elevated over 179/111, etiology of symptoms is most likely muscular, low suspicion for injury to left rotator cuff , patient endorses she is not taking daily medication today, Toradol injection given in office, prescribed meloxicam and Flexeril for outpatient management,  recommended RICE, heat, daily stretching and activity as tolerated, given walking referral to orthopedics if symptoms continue to persist or worsening Final Clinical Impressions(s) / UC Diagnoses   Final diagnoses:  None   Discharge Instructions   None  ED Prescriptions   None    PDMP not reviewed this encounter.   Hans Eden, NP 04/06/22 1314    Hans Eden, NP 04/06/22 1314

## 2022-05-18 ENCOUNTER — Other Ambulatory Visit: Payer: Self-pay

## 2022-05-19 MED ORDER — LORATADINE 10 MG PO TABS: 10.0000 mg | ORAL_TABLET | Freq: Every day | ORAL | 11 refills | Status: DC

## 2022-06-07 ENCOUNTER — Encounter (HOSPITAL_COMMUNITY): Payer: Self-pay | Admitting: Emergency Medicine

## 2022-06-07 ENCOUNTER — Ambulatory Visit (HOSPITAL_COMMUNITY)
Admission: EM | Admit: 2022-06-07 | Discharge: 2022-06-07 | Disposition: A | Discharge status: Home / Self Care | Payer: 59 | Attending: Internal Medicine | Admitting: Internal Medicine

## 2022-06-07 DIAGNOSIS — H1033 Unspecified acute conjunctivitis, bilateral: Secondary | ICD-10-CM | POA: Diagnosis not present

## 2022-06-07 DIAGNOSIS — H5789 Other specified disorders of eye and adnexa: Secondary | ICD-10-CM | POA: Diagnosis not present

## 2022-06-07 MED ORDER — GENTAMICIN SULFATE 0.3 % OP SOLN: 2.0000 [drp] | OPHTHALMIC | 0 refills | Status: AC

## 2022-06-07 MED ORDER — ERYTHROMYCIN 5 MG/GM OP OINT: TOPICAL_OINTMENT | OPHTHALMIC | 0 refills | Status: DC

## 2022-06-07 NOTE — ED Triage Notes (Signed)
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 C/o bilateral eye irritation, burning in nature, and itching over the last couple days. Reports a constant watery, crusting drainage. Denies visual changes or getting any foreign bodies in either eye. Both eyes appear red and irritated.

## 2022-06-07 NOTE — Discharge Instructions (Addendum)
Apply 2 drops of gentamicin antibiotic eyedrops into your eyes every 4 hours for the next 7 days. Apply warm compresses to your eyes prior to applying the eyedrops. Continue taking loratadine antihistamine medication as this will help dry up the drainage from your eyes.  Change your pillowcase after 2 to 3 days to avoid reinfection.   You may take Tylenol every 6 hours as needed for any pain you may have.   Avoid scratching your eye.  Wash your hands frequently to avoid spread of infection to others.  Perform warm compresses to your eye before applying the eyedrops.   If you develop any new or worsening symptoms or do not improve in the next 2 to 3 days, please return.  If your symptoms are severe, please go to the emergency room.  Follow-up with your primary care provider for further evaluation and management of your symptoms as well as ongoing wellness visits.  I hope you feel better!

## 2022-06-07 NOTE — ED Provider Notes (Signed)
Sara Day    CSN: [id number] Arrival date & time: 06/07/22  1259      History   Chief Complaint Chief Complaint  Patient presents with   Eye Problem    HPI Sara Day is a 56 y.o. female.   Patient presents to urgent care for evaluation of bilateral eye redness and irritation that started yesterday and has worsened upon waking this morning.  She states that her eyes were "glued shut this morning" when she woke up with yellow thick crusty drainage.  She has had excessive eye tearing and photosensitivity ever since waking up this morning.  She drove herself to urgent care but states that it was a "struggle" to drive due to photosensitivity.  Denies blurry vision, eye pain, and decreased visual acuity.  She wears glasses but denies contact use.  Denies trauma to the eyes recently and possibility of foreign body although she states that she has been scratching her eyes due to the itch associated with possible pinkeye infection.  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  No attempted use of over-the-counter medications prior to arrival urgent care.  No known sick contacts other than possibly students at her daycare.  No fever or chills, neck pain, URI symptoms, or dizziness reported.   Eye Problem   Past Medical History:  Diagnosis Date   Asthma    Breast mass    Hypertension     Patient Active Problem List   Diagnosis Date Noted   Headache 08/11/2021   Menopause 12/29/2019   HYPERCHOLESTEROLEMIA 05/21/2009   Essential hypertension 05/21/2009   GERD 05/07/2008    Past Surgical History:  Procedure Laterality Date   BREAST EXCISIONAL BIOPSY      OB History   No obstetric history on file.      Home Medications    Prior to Admission medications   Medication Sig Start Date End Date Taking? Authorizing Provider  gentamicin (GARAMYCIN) 0.3 % ophthalmic solution Place 2 drops into both eyes every 4 (four)  hours for 7 days. 06/07/22 06/14/22 Yes Safi Culotta, Stasia Cavalier, FNP  albuterol (VENTOLIN HFA) 108 (90 Base) MCG/ACT inhaler INHALE 2 PUFFS INTO THE LUNGS EVERY 4 HOURS AS NEEDED FOR WHEEZING 11/25/21   Autry-Lott, Naaman Plummer, DO  amLODipine (NORVASC) 5 MG tablet Take 1 tablet (5 mg total) by mouth at bedtime. 03/21/22   Raspet, Derry Skill, PA-C  ciprofloxacin (CILOXAN) 0.3 % ophthalmic solution Place 2 drops into the right eye every 6 (six) hours. 09/04/21   Scot Jun, FNP  cyclobenzaprine (FLEXERIL) 10 MG tablet Take 1 tablet (10 mg total) by mouth at bedtime. 04/06/22   White, Leitha Schuller, NP  hydrochlorothiazide (HYDRODIURIL) 25 MG tablet Take 1 tablet (25 mg total) by mouth daily. 03/21/22   Raspet, Derry Skill, PA-C  loratadine (CLARITIN) 10 MG tablet Take 1 tablet (10 mg total) by mouth daily. 05/19/22   Arlyce Dice, MD  meloxicam (MOBIC) 15 MG tablet Take 0.5 tablets (7.5 mg total) by mouth daily. 04/06/22   White, Leitha Schuller, NP  triamcinolone cream (KENALOG) 0.1 % Apply 1 application topically 2 (two) times daily. 05/18/21   Teodora Medici, FNP    Family History Family History  Problem Relation Age of Onset   Heart attack Father    Colon cancer Neg Hx    Colon polyps Neg Hx    Esophageal cancer Neg Hx    Rectal cancer Neg Hx  Stomach cancer Neg Hx     Social History Social History   Tobacco Use   Smoking status: Never   Smokeless tobacco: Never  Vaping Use   Vaping Use: Never used  Substance Use Topics   Alcohol use: Yes    Alcohol/week: 1.0 standard drink of alcohol    Types: 1 Glasses of wine per week   Drug use: No     Allergies   Clindamycin/lincomycin, Codeine, Metronidazole, Sulfa antibiotics, and Sulfamethoxazole   Review of Systems Review of Systems Per HPI  Physical Exam Triage Vital Signs ED Triage Vitals  Enc Vitals Group     BP 06/07/22 1345 (!) 180/111     Pulse Rate 06/07/22 1345 (!) 53     Resp 06/07/22 1345 16     Temp 06/07/22 1345 98 F (36.7 C)      Temp Source 06/07/22 1345 Oral     SpO2 06/07/22 1345 97 %     Weight --      Height --      Head Circumference --      Peak Flow --      Pain Score 06/07/22 1346 5     Pain Loc --      Pain Edu? --      Excl. in [place]? --    No data found.  Updated Vital Signs BP (!) 180/111 (BP Location: Left Arm)   Pulse (!) 53   Temp 98 F (36.7 C) (Oral)   Resp 16   SpO2 97%   Visual Acuity Right Eye Distance:   Left Eye Distance:   Bilateral Distance:    Right Eye Near:   Left Eye Near:    Bilateral Near:     Physical Exam Vitals and nursing note reviewed.  Constitutional:      Appearance: Normal appearance. She is not ill-appearing or toxic-appearing.     Comments: Very pleasant patient sitting on exam in position of comfort table in no acute distress.   HENT:     Head: Normocephalic and atraumatic.     Right Ear: Hearing and external ear normal.     Left Ear: Hearing and external ear normal.     Nose: Nose normal.     Mouth/Throat:     Lips: Pink.     Mouth: Mucous membranes are moist.  Eyes:     General: Lids are normal. Vision grossly intact. Gaze aligned appropriately.     Extraocular Movements: Extraocular movements intact.     Right eye: Normal extraocular motion.     Left eye: Normal extraocular motion.     Conjunctiva/sclera: Conjunctivae normal.     Comments: Difficult for patient to open her eyes all the way due to photophobia. EOMs normal although difficult to examine due to photophobia. Clear tearing to bilateral eyes present.   Cardiovascular:     Rate and Rhythm: Normal rate and regular rhythm.     Heart sounds: Normal heart sounds, S1 normal and S2 normal.  Pulmonary:     Effort: Pulmonary effort is normal. No respiratory distress.     Breath sounds: Normal breath sounds and air entry.  Abdominal:     Palpations: Abdomen is soft.  Musculoskeletal:     Cervical back: Neck supple.  Skin:    General: Skin is warm and dry.     Capillary Refill: Capillary  refill takes less than 2 seconds.     Findings: No rash.  Neurological:     General: No  focal deficit present.     Mental Status: She is alert and oriented to person, place, and time. Mental status is at baseline.     Cranial Nerves: No dysarthria or facial asymmetry.     Gait: Gait is intact.  Psychiatric:        Mood and Affect: Mood normal.        Speech: Speech normal.        Behavior: Behavior normal.        Thought Content: Thought content normal.        Judgment: Judgment normal.      UC Treatments / Results  Labs (all labs ordered are listed, but only abnormal results are displayed) Labs Reviewed - No data to display  EKG   Radiology No results found.  Procedures Procedures (including critical care time)  Medications Ordered in UC Medications - No data to display  Initial Impression / Assessment and Plan / UC Course  I have reviewed the triage vital signs and the nursing notes.  Pertinent labs & imaging results that were available during my care of the patient were reviewed by me and considered in my medical decision making (see chart for details).   1.  Acute bacterial conjunctivitis of both eyes and eye drainage  Initially prescribed erythromycin eye ointment to treat bilateral pinkeye after discussing options with patient.  Upon discharge, patient requesting prescription changed to antibiotic eyedrops.  Gentamicin eyedrops prescribed instead of erythromycin eye ointment.  Patient to use 2 drops into both eyes every 4 hours for the next 7 days to treat pinkeye.  Warm compresses prior to application of eyedrops advised.  Advised to avoid itching eyes and change pillowcase after 2 to 3 days to avoid reinfection.  She may use Tylenol every 6 hours as needed for any pain or discomfort.  No clinical indication for oral antibiotics at this time as vital signs are hemodynamically stable without signs of preseptal cellulitis.  Strict ED return precautions given.  Patient  agreeable with this plan.  Daughter to pick up patient from urgent care due to significant photophobia.   Discussed physical exam and available lab work findings in clinic with patient.  Counseled patient regarding appropriate use of medications and potential side effects for all medications recommended or prescribed today. Discussed red flag signs and symptoms of worsening condition,when to call the PCP office, return to urgent care, and when to seek higher level of care in the emergency department. Patient verbalizes understanding and agreement with plan. All questions answered. Patient discharged in stable condition.  Final Clinical Impressions(s) / UC Diagnoses   Final diagnoses:  Acute bacterial conjunctivitis of both eyes  Eye drainage     Discharge Instructions      Apply 2 drops of gentamicin antibiotic eyedrops into your eyes every 4 hours for the next 7 days. Apply warm compresses to your eyes prior to applying the eyedrops. Continue taking loratadine antihistamine medication as this will help dry up the drainage from your eyes.  Change your pillowcase after 2 to 3 days to avoid reinfection.   You may take Tylenol every 6 hours as needed for any pain you may have.   Avoid scratching your eye.  Wash your hands frequently to avoid spread of infection to others.  Perform warm compresses to your eye before applying the eyedrops.   If you develop any new or worsening symptoms or do not improve in the next 2 to 3 days, please return.  If your  symptoms are severe, please go to the emergency room.  Follow-up with your primary care provider for further evaluation and management of your symptoms as well as ongoing wellness visits.  I hope you feel better!    ED Prescriptions     Medication Sig Dispense Auth. Provider   erythromycin ophthalmic ointment  (Status: Discontinued) Place a 1/2 inch ribbon of ointment into the lower eyelid. 3.5 g Joella Prince M, FNP   gentamicin  (GARAMYCIN) 0.3 % ophthalmic solution Place 2 drops into both eyes every 4 (four) hours for 7 days. 5 mL Talbot Grumbling, FNP      PDMP not reviewed this encounter.   Talbot Grumbling, [place] 06/07/22 1451

## 2022-07-03 ENCOUNTER — Other Ambulatory Visit: Payer: Self-pay | Admitting: Family Medicine

## 2022-07-03 DIAGNOSIS — I10 Essential (primary) hypertension: Secondary | ICD-10-CM

## 2022-09-15 ENCOUNTER — Encounter (HOSPITAL_COMMUNITY): Payer: Self-pay | Admitting: Emergency Medicine

## 2022-09-15 ENCOUNTER — Emergency Department (HOSPITAL_COMMUNITY)
Admission: EM | Admit: 2022-09-15 | Discharge: 2022-09-16 | Disposition: A | Discharge status: Home / Self Care | Payer: 59 | Attending: Emergency Medicine | Admitting: Emergency Medicine

## 2022-09-15 ENCOUNTER — Other Ambulatory Visit: Payer: Self-pay

## 2022-09-15 ENCOUNTER — Emergency Department (HOSPITAL_COMMUNITY): Payer: 59

## 2022-09-15 ENCOUNTER — Encounter (HOSPITAL_COMMUNITY): Payer: Self-pay

## 2022-09-15 ENCOUNTER — Ambulatory Visit (HOSPITAL_COMMUNITY)
Admission: EM | Admit: 2022-09-15 | Discharge: 2022-09-15 | Disposition: A | Discharge status: Home / Self Care | Payer: 59 | Attending: Family Medicine | Admitting: Family Medicine

## 2022-09-15 DIAGNOSIS — N9489 Other specified conditions associated with female genital organs and menstrual cycle: Secondary | ICD-10-CM | POA: Insufficient documentation

## 2022-09-15 DIAGNOSIS — M549 Dorsalgia, unspecified: Secondary | ICD-10-CM | POA: Diagnosis not present

## 2022-09-15 DIAGNOSIS — N852 Hypertrophy of uterus: Secondary | ICD-10-CM | POA: Diagnosis not present

## 2022-09-15 DIAGNOSIS — R0689 Other abnormalities of breathing: Secondary | ICD-10-CM | POA: Diagnosis not present

## 2022-09-15 DIAGNOSIS — I700 Atherosclerosis of aorta: Secondary | ICD-10-CM | POA: Diagnosis not present

## 2022-09-15 DIAGNOSIS — R079 Chest pain, unspecified: Secondary | ICD-10-CM

## 2022-09-15 DIAGNOSIS — T68XXXA Hypothermia, initial encounter: Secondary | ICD-10-CM | POA: Diagnosis not present

## 2022-09-15 DIAGNOSIS — R0789 Other chest pain: Secondary | ICD-10-CM | POA: Diagnosis not present

## 2022-09-15 DIAGNOSIS — I959 Hypotension, unspecified: Secondary | ICD-10-CM | POA: Diagnosis not present

## 2022-09-15 DIAGNOSIS — Z743 Need for continuous supervision: Secondary | ICD-10-CM | POA: Diagnosis not present

## 2022-09-15 LAB — BASIC METABOLIC PANEL
Anion gap: 10 (ref 5–15)
BUN: 10 mg/dL (ref 6–20)
CO2: 26 mmol/L (ref 22–32)
Calcium: 8.9 mg/dL (ref 8.9–10.3)
Chloride: 106 mmol/L (ref 98–111)
Creatinine, Ser: 0.75 mg/dL (ref 0.44–1.00)
GFR, Estimated: >60 mL/min (ref >60)
Glucose, Bld: 101 mg/dL — ABNORMAL HIGH (ref 70–99)
Potassium: 3.7 mmol/L (ref 3.5–5.1)
Sodium: 142 mmol/L (ref 135–145)

## 2022-09-15 LAB — CBC
HCT: 39.7 % (ref 36.0–46.0)
Hemoglobin: 12.4 g/dL (ref 12.0–15.0)
MCH: 28.4 pg (ref 26.0–34.0)
MCHC: 31.2 g/dL (ref 30.0–36.0)
MCV: 90.8 fL (ref 80.0–100.0)
Platelets: 245 10*3/uL (ref 150–400)
RBC: 4.37 MIL/uL (ref 3.87–5.11)
RDW: 13.2 % (ref 11.5–15.5)
WBC: 6.1 10*3/uL (ref 4.0–10.5)
nRBC: 0 % (ref 0.0–0.2)

## 2022-09-15 LAB — TROPONIN I (HIGH SENSITIVITY)
Troponin I (High Sensitivity): 4 ng/L (ref <18)
Troponin I (High Sensitivity): 7 ng/L (ref <18)

## 2022-09-15 LAB — I-STAT BETA HCG BLOOD, ED (MC, WL, AP ONLY): I-stat hCG, quantitative: <5 m[IU]/mL (ref <5)

## 2022-09-15 MED ORDER — SODIUM CHLORIDE 0.9 % IV BOLUS
1000.0000 mL | Freq: Once | INTRAVENOUS | Status: AC
  Administered 2022-09-15: 1000 mL via INTRAVENOUS

## 2022-09-15 MED ORDER — ASPIRIN 81 MG PO CHEW
CHEWABLE_TABLET | ORAL | Status: AC
  Filled 2022-09-15: qty 4

## 2022-09-15 MED ORDER — SODIUM CHLORIDE 0.9 % IV SOLN: INTRAVENOUS | Status: DC

## 2022-09-15 MED ORDER — MORPHINE SULFATE (PF) 4 MG/ML IV SOLN
INTRAVENOUS | Status: AC
  Filled 2022-09-15: qty 1

## 2022-09-15 MED ORDER — ONDANSETRON 4 MG PO TBDP
4.0000 mg | ORAL_TABLET | Freq: Once | ORAL | Status: AC
  Administered 2022-09-16: 4 mg via ORAL
  Filled 2022-09-15: qty 1

## 2022-09-15 MED ORDER — ASPIRIN 81 MG PO CHEW
324.0000 mg | CHEWABLE_TABLET | Freq: Once | ORAL | Status: AC
  Administered 2022-09-15: 324 mg via ORAL

## 2022-09-15 MED ORDER — MORPHINE SULFATE (PF) 4 MG/ML IV SOLN
4.0000 mg | Freq: Once | INTRAVENOUS | Status: AC
  Administered 2022-09-15: 4 mg via INTRAVENOUS

## 2022-09-15 NOTE — ED Triage Notes (Signed)
Pt arrives via EMS from urgent care for cp. Pain has been going on for 3 days- radiates to left shoulder. Pain is intermittent. Pt having some nausea as well. Denies sob. 99% on room air, '4mg'$  morphine given by urgent care. Pt feeling dizzy/ n/v after morphine. Pt received '324mg'$  ASA. EKG showed sinus brady.BP 152/90, HR 50, 18

## 2022-09-15 NOTE — ED Notes (Signed)
Pt wants to leave. Encouraged to stay and informed of risks. IV removed.

## 2022-09-15 NOTE — ED Provider Notes (Signed)
[facility]   [id number] 09/15/22 Arrival Time: 1026  ASSESSMENT & PLAN:  1. Chest pain, unspecified type    Seen in triage with RN.  ECG without acute changes; NSR; no STEMI. O2 on via [place]. Pt with PMH asthma/HTN is reporting sub-sternal CP actively in triage room; radiates to back when present; first episode noted this morning upon waking; waxing/waning; does become diaphoretic with episodes (witnessed here). Mild nausea. Reports assoc SOB. No h/o similar reported. Lung sounds appreciated bilaterally without wheezing. No emesis. Denies tobacco use. Denies recreational drug use. Denies GERD symptoms. Normal appetite recently.  BP (!) 159/102 (BP Location: Right Arm)   Pulse 64   Temp 98.3 F (36.8 C) (Oral)   Resp 18   SpO2 100%   Meds ordered this encounter  Medications   sodium chloride 0.9 % bolus 1,000 mL   0.9 %  sodium chloride infusion   aspirin chewable tablet 324 mg   morphine (PF) 4 MG/ML injection 4 mg   To ED via [facility]. Stable upon discharge.  Past Medical History:  Diagnosis Date   Asthma    Breast mass    Hypertension    Reviewed expectations re: course of current medical issues. Questions answered. Outlined signs and symptoms indicating need for more acute intervention.  OBJECTIVE:  Vitals:   09/15/22 1035  BP: (!) 159/102  Pulse: 64  Resp: 18  Temp: 98.3 F (36.8 C)  TempSrc: Oral  SpO2: 100%    General appearance:  appears uncomfortable Eyes: PERRLA; EOMI; conjunctivae normal HENT: normocephalic; atraumatic Lungs: without labored respirations; speaks full sentences without difficulty Heart: regular Chest Wall: without tenderness to palpation Abdomen: soft, non-tender; no guarding or rebound tenderness Extremities: without edema; without calf swelling or tenderness; symmetrical without gross deformities Skin: without rash or lesions Neuro: normal gait Psychological: alert and cooperative; normal mood and  affect    Allergies  Allergen Reactions   Clindamycin/Lincomycin Swelling    Mouth and lip swelling    Codeine Rash   Metronidazole Rash   Sulfa Antibiotics Rash   Sulfamethoxazole Rash    Past Medical History:  Diagnosis Date   Asthma    Breast mass    Hypertension    Social History   Socioeconomic History   Marital status: Single    Spouse name: Not on file   Number of children: Not on file   Years of education: Not on file   Highest education level: Not on file  Occupational History   Not on file  Tobacco Use   Smoking status: Never   Smokeless tobacco: Never  Vaping Use   Vaping Use: Never used  Substance and Sexual Activity   Alcohol use: Yes    Alcohol/week: 1.0 standard drink of alcohol    Types: 1 Glasses of wine per week   Drug use: No   Sexual activity: Yes    Birth control/protection: None  Other Topics Concern   Not on file  Social History Narrative   Not on file   Social Determinants of Health   Financial Resource Strain: Not on file  Food Insecurity: Not on file  Transportation Needs: Not on file  Physical Activity: Not on file  Stress: Not on file  Social Connections: Not on file  Intimate Partner Violence: Not on file   Family History  Problem Relation Age of Onset   Heart attack Father    Colon cancer Neg Hx    Colon polyps Neg Hx  Esophageal cancer Neg Hx    Rectal cancer Neg Hx    Stomach cancer Neg Hx    Past Surgical History:  Procedure Laterality Date   BREAST EXCISIONAL BIOPSY        Vanessa Kick, MD 09/15/22 1101

## 2022-09-15 NOTE — ED Provider Triage Note (Signed)
Emergency Medicine Provider Triage Evaluation Note  Sara Day , a 56 y.o. female  was evaluated in triage.  Patient presents today with a couple days worth of chest pain.  Says that it is sharp and mostly right-sided.  Says that it does radiate into her back.  She was seen at urgent care earlier today and they gave her aspirin and morphine.  She says the pain is no different but she is nauseous and dry heaving.  This was not present prior to the medications given at urgent care.  No history of ACS or other medical conditions.  No shortness of breath or palpitations.  Review of Systems  Positive:  Negative:   Physical Exam  There were no vitals taken for this visit. Gen:   Awake, no distress   Resp:  Normal effort  MSK:   Moves extremities without difficulty  Other:  RRR, lung sounds clear.  Speaking in complete sentences, chest pain is not reproducible.  Medical Decision Making  Medically screening exam initiated at 11:52 AM.  Appropriate orders placed.  Sara Day was informed that the remainder of the evaluation will be completed by another provider, this initial triage assessment does not replace that evaluation, and the importance of remaining in the ED until their evaluation is complete.     Rhae Hammock, PA-C 09/15/22 1159

## 2022-09-15 NOTE — ED Notes (Signed)
Called [facility] spoke to [place] he states will not have a truck until about 11:30AM. Checked with Dr Mannie Stabile he advised call EMS needs a ACLS truck due to IV started. Called EMS 10:52am they will send a truck. Called charge nurse and main number at [facility] sent to voicemail X4.

## 2022-09-15 NOTE — ED Triage Notes (Signed)
Pt c/o center chest pain radiating to lt shoulder with diaphoresis and SOB x3 days intermittent lasing around one minute. States feels like heart burn but hasn't taking any meds.

## 2022-09-15 NOTE — ED Notes (Signed)
Patient is being discharged from the Urgent Care and sent to the Emergency Department via EMS . Per Dr. Mannie Stabile, patient is in need of higher level of care due to chest pain. Patient is aware and verbalizes understanding of plan of care.  Vitals:   09/15/22 1035  BP: (!) 159/102  Pulse: 64  Resp: 18  Temp: 98.3 F (36.8 C)  SpO2: 100%

## 2022-09-16 ENCOUNTER — Emergency Department (HOSPITAL_COMMUNITY): Payer: 59

## 2022-09-16 DIAGNOSIS — M549 Dorsalgia, unspecified: Secondary | ICD-10-CM | POA: Diagnosis not present

## 2022-09-16 DIAGNOSIS — R079 Chest pain, unspecified: Secondary | ICD-10-CM | POA: Diagnosis not present

## 2022-09-16 DIAGNOSIS — I700 Atherosclerosis of aorta: Secondary | ICD-10-CM | POA: Diagnosis not present

## 2022-09-16 DIAGNOSIS — N852 Hypertrophy of uterus: Secondary | ICD-10-CM | POA: Diagnosis not present

## 2022-09-16 MED ORDER — IOHEXOL 350 MG/ML SOLN
50.0000 mL | Freq: Once | INTRAVENOUS | Status: AC | PRN
  Administered 2022-09-16: 50 mL via INTRAVENOUS

## 2022-09-16 MED ORDER — MELOXICAM 7.5 MG PO TABS: 7.5000 mg | ORAL_TABLET | Freq: Every day | ORAL | 0 refills | Status: DC

## 2022-09-16 MED ORDER — KETOROLAC TROMETHAMINE 30 MG/ML IJ SOLN
15.0000 mg | Freq: Once | INTRAMUSCULAR | Status: AC
  Administered 2022-09-16: 15 mg via INTRAVENOUS
  Filled 2022-09-16: qty 1

## 2022-09-16 MED ORDER — FENTANYL CITRATE PF 50 MCG/ML IJ SOSY
50.0000 ug | PREFILLED_SYRINGE | Freq: Once | INTRAMUSCULAR | Status: AC
  Administered 2022-09-16: 50 ug via INTRAVENOUS
  Filled 2022-09-16: qty 1

## 2022-09-16 MED ORDER — PROCHLORPERAZINE EDISYLATE 10 MG/2ML IJ SOLN
10.0000 mg | Freq: Once | INTRAMUSCULAR | Status: AC
  Administered 2022-09-16: 10 mg via INTRAVENOUS
  Filled 2022-09-16: qty 2

## 2022-09-16 NOTE — ED Provider Notes (Signed)
[facility] EMERGENCY DEPARTMENT Provider Note   CSN: [id number] Arrival date & time: 09/15/22  1148     History  Chief Complaint  Patient presents with   Chest Pain    Sara Day is a 56 y.o. female.  56 year old female who presents the ER today with chest pain.  States it radiates towards her back into her arms.  This been going on and off for the last week.  Severe and sharp in nature.  No other associated symptoms.  No exacerbating relieving factors.  No fever or cough.   Chest Pain      Home Medications Prior to Admission medications   Medication Sig Start Date End Date Taking? Authorizing Provider  meloxicam (MOBIC) 7.5 MG tablet Take 1 tablet (7.5 mg total) by mouth daily. 09/16/22  Yes Kaleisha Bhargava, Corene Cornea, MD  albuterol (VENTOLIN HFA) 108 (90 Base) MCG/ACT inhaler INHALE 2 PUFFS INTO THE LUNGS EVERY 4 HOURS AS NEEDED FOR WHEEZING 11/25/21   Autry-Lott, Naaman Plummer, DO  amLODipine (NORVASC) 5 MG tablet Take 1 tablet (5 mg total) by mouth at bedtime. 03/21/22   Raspet, Derry Skill, PA-C  ciprofloxacin (CILOXAN) 0.3 % ophthalmic solution Place 2 drops into the right eye every 6 (six) hours. 09/04/21   Scot Jun, FNP  cyclobenzaprine (FLEXERIL) 10 MG tablet Take 1 tablet (10 mg total) by mouth at bedtime. 04/06/22   White, Leitha Schuller, NP  hydrochlorothiazide (HYDRODIURIL) 12.5 MG tablet TAKE 1 TABLET BY MOUTH DAILY 07/06/22   Arlyce Dice, MD  hydrochlorothiazide (HYDRODIURIL) 25 MG tablet Take 1 tablet (25 mg total) by mouth daily. 03/21/22   Raspet, Derry Skill, PA-C  loratadine (CLARITIN) 10 MG tablet Take 1 tablet (10 mg total) by mouth daily. 05/19/22   Arlyce Dice, MD  triamcinolone cream (KENALOG) 0.1 % Apply 1 application topically 2 (two) times daily. 05/18/21   Teodora Medici, FNP      Allergies    Clindamycin/lincomycin, Codeine, Metronidazole, Sulfa antibiotics, and Sulfamethoxazole    Review of Systems   Review of Systems  Cardiovascular:  Positive for  chest pain.    Physical Exam Updated Vital Signs BP 129/76 (BP Location: Left Arm)   Pulse (!) 57   Temp 97.7 F (36.5 C) (Oral)   Resp 14   SpO2 100%  Physical Exam Vitals and nursing note reviewed.  Constitutional:      Appearance: She is well-developed.  HENT:     Head: Normocephalic and atraumatic.  Cardiovascular:     Rate and Rhythm: Normal rate and regular rhythm.  Pulmonary:     Effort: No respiratory distress.     Breath sounds: No stridor.  Chest:     Chest wall: No mass or tenderness.  Abdominal:     General: There is no distension.     Palpations: Abdomen is soft.  Musculoskeletal:        General: Normal range of motion.     Cervical back: Normal range of motion.     Comments: Tenderness to the left latissimus muscles.  Skin:    General: Skin is warm and dry.  Neurological:     Mental Status: She is alert.     ED Results / Procedures / Treatments   Labs (all labs ordered are listed, but only abnormal results are displayed) Labs Reviewed  BASIC METABOLIC PANEL - Abnormal; Notable for the following components:      Result Value   Glucose, Bld 101 (*)    All  other components within normal limits  CBC  I-STAT BETA HCG BLOOD, ED ([facility], [facility], [facility] ONLY)  TROPONIN I (HIGH SENSITIVITY)  TROPONIN I (HIGH SENSITIVITY)    EKG EKG Interpretation  Date/Time:  Tuesday September 15 2022 12:05:36 EST Ventricular Rate:  54 PR Interval:  170 QRS Duration: 70 QT Interval:  450 QTC Calculation: 426 R Axis:   78 Text Interpretation: Sinus bradycardia Otherwise normal ECG When compared with ECG of 15-Sep-2022 10:40, PREVIOUS ECG IS PRESENT Confirmed by Merrily Pew [phone]) on 09/16/2022 1:21:08 AM  Radiology CT Angio Chest/Abd/Pel for Dissection W and/or Wo Contrast  Result Date: 09/16/2022 CLINICAL DATA:  56 year old female with history of chest pain and back pain for the past 2 days. Suspected acute aortic syndrome. EXAM: CT ANGIOGRAPHY CHEST, ABDOMEN AND PELVIS  TECHNIQUE: Non-contrast CT of the chest was initially obtained. Multidetector CT imaging through the chest, abdomen and pelvis was performed using the standard protocol during bolus administration of intravenous contrast. Multiplanar reconstructed images and MIPs were obtained and reviewed to evaluate the vascular anatomy. RADIATION DOSE REDUCTION: This exam was performed according to the departmental dose-optimization program which includes automated exposure control, adjustment of the mA and/or kV according to patient size and/or use of iterative reconstruction technique. CONTRAST:  69m OMNIPAQUE IOHEXOL 350 MG/ML SOLN COMPARISON:  None available. FINDINGS: CTA CHEST FINDINGS Cardiovascular: Precontrast images demonstrate no crescentic high attenuation associated with the wall of the thoracic aorta to indicate the presence of acute intramural hemorrhage. Postcontrast images demonstrate no evidence of thoracic aortic aneurysm or dissection. Thoracic aorta is normal in caliber measuring 3.0 cm, 2.5 cm, and 2.0 cm in diameter in the ascending thoracic aorta, mid aortic arch and descending thoracic aorta respectively. Heart size is normal. There is no significant pericardial fluid, thickening or pericardial calcification. No atherosclerotic calcifications are noted in the coronary arteries. Mediastinum/Nodes: No pathologically enlarged mediastinal or hilar lymph nodes. Esophagus is unremarkable in appearance. No axillary lymphadenopathy. Lungs/Pleura: No suspicious appearing pulmonary nodules or masses are noted. No pneumothorax. No acute consolidative airspace disease. No pleural effusions. Musculoskeletal: There are no aggressive appearing lytic or blastic lesions noted in the visualized portions of the skeleton. Review of the MIP images confirms the above findings. CTA ABDOMEN AND PELVIS FINDINGS VASCULAR Aorta: Mild atherosclerosis. Normal caliber aorta without aneurysm, dissection, vasculitis or significant  stenosis. Celiac: Patent without evidence of aneurysm, dissection, vasculitis or significant stenosis. SMA: Patent without evidence of aneurysm, dissection, vasculitis or significant stenosis. Renals: Both renal arteries are patent without evidence of aneurysm, dissection, vasculitis, fibromuscular dysplasia or significant stenosis. IMA: Patent without evidence of aneurysm, dissection, vasculitis or significant stenosis. Inflow: Patent without evidence of aneurysm, dissection, vasculitis or significant stenosis. Veins: No obvious venous abnormality within the limitations of this arterial phase study. Review of the MIP images confirms the above findings. NON-VASCULAR Hepatobiliary: No suspicious cystic or solid hepatic lesions noted on today's arterial phase examination. No intra or extrahepatic biliary ductal dilatation. Gallbladder is normal in appearance. Pancreas: No pancreatic mass. No pancreatic ductal dilatation. No pancreatic or peripancreatic fluid collections or inflammatory changes. Spleen: Unremarkable. Adrenals/Urinary Tract: Bilateral kidneys and bilateral adrenal glands are normal in appearance. No hydroureteronephrosis. Urinary bladder is unremarkable in appearance. Stomach/Bowel: The appearance of the stomach is normal. There is no pathologic dilatation of small bowel or colon. The appendix is not confidently identified and may be surgically absent. Regardless, there are no inflammatory changes noted adjacent to the cecum to suggest the presence of an acute appendicitis at  this time. Lymphatic: No lymphadenopathy noted in the abdomen or pelvis. Reproductive: Enlargement of the fundus of the uterus where there appears to be a solitary mass measuring 6.4 x 8.0 x 7.0 cm, likely a dominant fibroid. There is also an exophytic lesion extending off the superolateral aspect of the fundus of the uterus (coronal image 66 of series 9) measuring 3.7 cm in diameter, likely a subserosal fibroid. Ovaries are  unremarkable in appearance. Other: No significant volume of ascites.  No pneumoperitoneum. Musculoskeletal: There are no aggressive appearing lytic or blastic lesions noted in the visualized portions of the skeleton. Review of the MIP images confirms the above findings. IMPRESSION: 1. No acute findings are noted in the chest, abdomen or pelvis to account for the patient's symptoms. Specifically, no evidence of acute aortic syndrome. 2. Probable fibroid uterus, as above. 3. Mild atherosclerosis of the abdominal and pelvic vasculature. Electronically Signed   By: Vinnie Langton M.D.   On: 09/16/2022 07:01   DG Chest 2 View  Result Date: 09/15/2022 CLINICAL DATA:  Chest pain EXAM: CHEST - 2 VIEW COMPARISON:  12/16/2014 FINDINGS: The heart size and mediastinal contours are within normal limits. Both lungs are clear. The visualized skeletal structures are unremarkable. IMPRESSION: No active cardiopulmonary disease. Electronically Signed   By: Fidela Salisbury M.D.   On: 09/15/2022 12:31    Procedures Procedures    Medications Ordered in ED Medications  ketorolac (TORADOL) 30 MG/ML injection 15 mg (has no administration in time range)  ondansetron (ZOFRAN-ODT) disintegrating tablet 4 mg (4 mg Oral Given 09/16/22 0355)  fentaNYL (SUBLIMAZE) injection 50 mcg (50 mcg Intravenous Given 09/16/22 0353)  prochlorperazine (COMPAZINE) injection 10 mg (10 mg Intravenous Given 09/16/22 0354)  iohexol (OMNIPAQUE) 350 MG/ML injection 50 mL (50 mLs Intravenous Contrast Given 09/16/22 3646)    ED Course/ Medical Decision Making/ A&P                           Medical Decision Making Amount and/or Complexity of Data Reviewed Radiology: ordered.  Risk Prescription drug management.   Troponins and EKG are reassuring low suspicion for ACS as a cause for symptoms.  Did consider aortic dissection, PE so CT scan was done that did not show evidence of either 1.  Her pain improved with medications here in the  emergency room she has no other associated symptoms to suggest further work-up or need for hospitalization.  We will send her home on anti-inflammatories with PCP follow-up.  Return here for new or worsening symptoms.  Final Clinical Impression(s) / ED Diagnoses Final diagnoses:  Nonspecific chest pain    Rx / DC Orders ED Discharge Orders          Ordered    meloxicam (MOBIC) 7.5 MG tablet  Daily        09/16/22 0719              Jahmeir Geisen, Corene Cornea, MD 09/16/22 [phone]

## 2022-09-16 NOTE — ED Notes (Signed)
EDP at [facility] discussing d/c plan. Pt alert, resting, NAD, calm, interactive.

## 2022-09-21 ENCOUNTER — Telehealth: Payer: Self-pay

## 2022-09-21 NOTE — Patient Outreach (Signed)
  Care Coordination [facility] Note Transition Care Management Unsuccessful Follow-up Telephone Call  Date of discharge and from where:  [facility] 09/15/22  Attempts:  1st Attempt  Reason for unsuccessful TCM follow-up call:  No answer/busy  Johnney Killian, RN, BSN, CCM Care Management Coordinator [facility] Phone: [phone]: [phone]

## 2022-09-22 ENCOUNTER — Telehealth: Payer: Self-pay

## 2022-09-22 NOTE — Patient Outreach (Signed)
  Care Coordination [facility] Note Transition Care Management Follow-up Telephone Call Date of discharge and from where: [facility] 09/15/22 How have you been since you were released from the hospital? "I have been feeling better.  No chest pain or tightness" Any questions or concerns? Yes-Need appt with PCP.  Message sent to [facility] admin to request follow up appointment.  Items Reviewed: Did the pt receive and understand the discharge instructions provided? Yes  Medications obtained and verified? Yes  Other? No  Any new allergies since your discharge? No  Dietary orders reviewed? No Do you have support at home? Yes   Home Care and Equipment/Supplies: Were home health services ordered? no If so, what is the name of the agency? N/a  Has the agency set up a time to come to the patient's home? not applicable Were any new equipment or medical supplies ordered?  No What is the name of the medical supply agency? N/a Were you able to get the supplies/equipment? not applicable Do you have any questions related to the use of the equipment or supplies? No  Functional Questionnaire: (I = Independent and D = Dependent) ADLs: I  Bathing/Dressing- I  Meal Prep- I  Eating- I  Maintaining continence- I  Transferring/Ambulation- I  Managing Meds- I  Follow up appointments reviewed:  PCP Hospital f/u appt confirmed? No   Specialist Hospital f/u appt confirmed? No   Are transportation arrangements needed? No  If their condition worsens, is the pt aware to call PCP or go to the Emergency Dept.? Yes Was the patient provided with contact information for the PCP's office or ED? Yes Was to pt encouraged to call back with questions or concerns? Yes  SDOH assessments and interventions completed:   Yes   Care Coordination Interventions:  PCP follow up appointment requested   Encounter Outcome:  Pt. Visit Completed

## 2023-01-14 DIAGNOSIS — Z1231 Encounter for screening mammogram for malignant neoplasm of breast: Secondary | ICD-10-CM | POA: Diagnosis not present

## 2023-02-03 ENCOUNTER — Encounter: Payer: Self-pay | Admitting: Family Medicine

## 2023-03-29 ENCOUNTER — Other Ambulatory Visit: Payer: Self-pay

## 2023-03-29 DIAGNOSIS — J45909 Unspecified asthma, uncomplicated: Secondary | ICD-10-CM

## 2023-03-30 MED ORDER — ALBUTEROL SULFATE HFA 108 (90 BASE) MCG/ACT IN AERS: 2.0000 | INHALATION_SPRAY | RESPIRATORY_TRACT | 3 refills | Status: DC | PRN

## 2023-04-01 MED ORDER — ALBUTEROL SULFATE HFA 108 (90 BASE) MCG/ACT IN AERS: 2.0000 | INHALATION_SPRAY | RESPIRATORY_TRACT | 3 refills | Status: AC | PRN

## 2023-04-01 NOTE — Addendum Note (Signed)
Addended by: Veronda Prude on: 04/01/2023 10:43 AM   Modules accepted: Orders

## 2023-04-01 NOTE — Telephone Encounter (Signed)
Patient calls nurse line regarding inhaler refill.   She states that this was supposed to be sent to [facility] on [address].   Called and canceled prescription at [facility] and resent to [facility] on [address].   Veronda Prude, RN

## 2023-05-05 ENCOUNTER — Ambulatory Visit (INDEPENDENT_AMBULATORY_CARE_PROVIDER_SITE_OTHER): Payer: 59 | Admitting: Family Medicine

## 2023-05-05 ENCOUNTER — Encounter: Payer: Self-pay | Admitting: Family Medicine

## 2023-05-05 ENCOUNTER — Other Ambulatory Visit: Payer: Self-pay

## 2023-05-05 VITALS — BP 150/95 | HR 70 | Ht 61.0 in | Wt 147.6 lb

## 2023-05-05 DIAGNOSIS — J45909 Unspecified asthma, uncomplicated: Secondary | ICD-10-CM | POA: Diagnosis not present

## 2023-05-05 DIAGNOSIS — M25561 Pain in right knee: Secondary | ICD-10-CM | POA: Diagnosis not present

## 2023-05-05 DIAGNOSIS — I10 Essential (primary) hypertension: Secondary | ICD-10-CM | POA: Diagnosis not present

## 2023-05-05 DIAGNOSIS — G8929 Other chronic pain: Secondary | ICD-10-CM

## 2023-05-05 MED ORDER — DICLOFENAC SODIUM 1 % EX GEL: 2.0000 g | Freq: Four times a day (QID) | CUTANEOUS | 1 refills | Status: DC | PRN

## 2023-05-05 MED ORDER — BUDESONIDE-FORMOTEROL FUMARATE 80-4.5 MCG/ACT IN AERO: 2.0000 | INHALATION_SPRAY | Freq: Two times a day (BID) | RESPIRATORY_TRACT | 12 refills | Status: DC

## 2023-05-05 MED ORDER — AMLODIPINE BESYLATE 5 MG PO TABS: 5.0000 mg | ORAL_TABLET | Freq: Every day | ORAL | 1 refills | Status: DC

## 2023-05-05 MED ORDER — TRIAMCINOLONE ACETONIDE 0.1 % EX CREA: 1.0000 | TOPICAL_CREAM | Freq: Two times a day (BID) | CUTANEOUS | 0 refills | Status: DC

## 2023-05-05 NOTE — Assessment & Plan Note (Signed)
Uncontrolled.  Patient reports taking HCTZ 12.5 daily, however did not take it today or yesterday.  Reports missing about 1 dose a week. -Continue HCTZ 12.5 mg daily -Start amlodipine 5 mg daily -Follow-up in 1 to 2 weeks to check blood pressure.  Encourage patient to bring in medications to confirm dosage of meds.

## 2023-05-05 NOTE — Assessment & Plan Note (Signed)
Chronic and uncontrolled.  Reports taking her albuterol inhaler daily.  Does not smoke, however is around people who do smoke. -Start Symbicort 2 puffs twice daily for controller inhaler.  Also encouraged to use up to 5 more times per day as a rescue inhaler. (SMART therapy)

## 2023-05-05 NOTE — Patient Instructions (Signed)
Good to see you today - Thank you for coming in  Things we discussed today:  1) For your Right Knee - Start applying Voltaren gel to it up to 4 times a day as needed for pain - Continue taking Tylenol 100mg  every 6 hours as needed for pain - I sent a referral for you to see physical therapy to help strengthen the muscles of your knee  2) For your blood pressure,  - Continue taking Hydrochlorathiazide - Start taking Amlodipine 5mg  once a day - Come back in 2 weeks for [place] to follow-up on your Blood Pressure  3) For your asthma,  - Start taking budesonide-formoterol 2 puffs twice a day. Take this every day to help prevent asthma attacks  - You can take it up to 5 more times a day as needed for a rescue inhaler too (this replaces albuterol).  Please always bring your medication bottles  Come back to see me in 2 weeks.

## 2023-05-05 NOTE — Progress Notes (Signed)
    SUBJECTIVE:   CHIEF COMPLAINT / HPI:   Sara Day is a 57yo F w/ hx of HTN, HLD, asthma that p/f R knee pain  R knee pain - Reports falling on this knee about 1 yr ago - Has had pain in knee since then - Went to [facility] a few months, they gave her a knee brace that she has been wearing - Requesting parking permit for knee. Having R knee pain - Pain is in front of her knee - Has to take break while walking in public due to pain - Tried tylenol and ibuprofen but hasn't been helpful.   HTN - Reports taking hydrochlorothiazide 12.5mg , not 25mg  - had wine last night - misses about 1 dose a week - didn't take yesterday or today  Wheezing  Asthma - Has been taking alubterol every day - Doesn't smoke - Is around people who smoke    OBJECTIVE:   BP (!) 150/95   Pulse 70   Ht 5\' 1"  (1.549 m)   Wt 147 lb 9.6 oz (67 kg)   SpO2 98%   BMI 27.89 kg/m   General: Alert, pleasant woman. NAD. HEENT: NCAT. MMM. CV: RRR, no murmurs.  Resp: Diffuse expiratory wheezing in all lung fields. Normal WOB on RA.  Abm: Soft, nontender, nondistended. BS present. Ext: Tender along anterior aspect of knee Skin: Warm, well perfused    ASSESSMENT/PLAN:   Essential hypertension Uncontrolled.  Patient reports taking HCTZ 12.5 daily, however did not take it today or yesterday.  Reports missing about 1 dose a week. -Continue HCTZ 12.5 mg daily -Start amlodipine 5 mg daily -Follow-up in 1 to 2 weeks to check blood pressure.  Encourage patient to bring in medications to confirm dosage of meds.  Asthma Chronic and uncontrolled.  Reports taking her albuterol inhaler daily.  Does not smoke, however is around people who do smoke. -Start Symbicort 2 puffs twice daily for controller inhaler.  Also encouraged to use up to 5 more times per day as a rescue inhaler. (SMART therapy)  Knee pain, right 1 year history of anterior knee pain after falling.  Pain limits her ability to walk long distances.  Pain is most  likely MSK in nature given history of fall.  Less likely infectious given lack of fever and duration of symptoms. - Continue prn tylenol - Start voltaren gel prn - Encouraged to ice, elevate, and rest -Referral to PT  F/u Recs: - Check lipid panel - shingrix vaccine - f/u asthma and HTN   Lincoln Brigham, MD [facility]

## 2023-05-05 NOTE — Assessment & Plan Note (Signed)
1 year history of anterior knee pain after falling.  Pain limits her ability to walk long distances.  Pain is most likely MSK in nature given history of fall.  Less likely infectious given lack of fever and duration of symptoms. - Continue prn tylenol - Start voltaren gel prn - Encouraged to ice, elevate, and rest -Referral to PT

## 2023-05-25 NOTE — Progress Notes (Deleted)
    SUBJECTIVE:   CHIEF COMPLAINT / HPI:   ***  - f/u BP - f/u asthma  - lipid - shingles  PERTINENT  PMH / PSH: ***  OBJECTIVE:   There were no vitals taken for this visit.  ***  ASSESSMENT/PLAN:   No problem-specific Assessment & Plan notes found for this encounter.     Lincoln Brigham, MD [facility]

## 2023-05-26 ENCOUNTER — Ambulatory Visit: Payer: Self-pay | Admitting: Family Medicine

## 2023-07-19 ENCOUNTER — Other Ambulatory Visit: Payer: Self-pay | Admitting: Family Medicine

## 2023-07-19 DIAGNOSIS — M25561 Pain in right knee: Secondary | ICD-10-CM

## 2023-07-29 ENCOUNTER — Other Ambulatory Visit: Payer: Self-pay | Admitting: Family Medicine

## 2023-07-29 DIAGNOSIS — I10 Essential (primary) hypertension: Secondary | ICD-10-CM

## 2023-09-08 ENCOUNTER — Encounter: Payer: Self-pay | Admitting: Gastroenterology

## 2023-09-27 ENCOUNTER — Encounter: Payer: Self-pay | Admitting: Gastroenterology

## 2023-10-08 ENCOUNTER — Other Ambulatory Visit: Payer: Self-pay | Admitting: Family Medicine

## 2023-10-08 DIAGNOSIS — I10 Essential (primary) hypertension: Secondary | ICD-10-CM

## 2023-10-14 ENCOUNTER — Encounter: Payer: Self-pay | Admitting: Family Medicine

## 2023-10-14 ENCOUNTER — Ambulatory Visit (INDEPENDENT_AMBULATORY_CARE_PROVIDER_SITE_OTHER): Payer: 59 | Admitting: Family Medicine

## 2023-10-14 VITALS — BP 176/100 | HR 77 | Ht 61.0 in | Wt 156.4 lb

## 2023-10-14 DIAGNOSIS — I10 Essential (primary) hypertension: Secondary | ICD-10-CM | POA: Diagnosis not present

## 2023-10-14 DIAGNOSIS — J45909 Unspecified asthma, uncomplicated: Secondary | ICD-10-CM | POA: Diagnosis not present

## 2023-10-14 DIAGNOSIS — G8929 Other chronic pain: Secondary | ICD-10-CM

## 2023-10-14 DIAGNOSIS — M25561 Pain in right knee: Secondary | ICD-10-CM | POA: Diagnosis not present

## 2023-10-14 DIAGNOSIS — Z23 Encounter for immunization: Secondary | ICD-10-CM | POA: Diagnosis not present

## 2023-10-14 MED ORDER — AMLODIPINE BESYLATE 10 MG PO TABS: 10.0000 mg | ORAL_TABLET | Freq: Every day | ORAL | 3 refills | Status: DC

## 2023-10-14 MED ORDER — HYDROCHLOROTHIAZIDE 12.5 MG PO TABS: 12.5000 mg | ORAL_TABLET | Freq: Every day | ORAL | 1 refills | Status: DC

## 2023-10-14 MED ORDER — LORATADINE 10 MG PO TABS: 10.0000 mg | ORAL_TABLET | Freq: Every day | ORAL | 11 refills | Status: DC

## 2023-10-14 NOTE — Progress Notes (Signed)
    SUBJECTIVE:   Chief compliant/HPI: annual examination  Sara Day is a 57 y.o. who presents today for an annual exam.    HTN - started amlodipine at last visit, has been tolerating well - Had alcohol last night - Has been taking amlodipine and hydrochlorothiazide.   Asthma - Started symbicort as needed since last visit. Feels like this has been controlling her asthma well.  No longer requiring her albuterol daily.   Health maintenance - Due for colonoscopy in 5-10 years from 2019.  - Lipid panel  Tobacco: none Alcohol: Has wine every few months Drug: Denies.    OBJECTIVE:   BP (!) 176/100   Pulse 77   Ht 5\' 1"  (1.549 m)   Wt 156 lb 6 oz (70.9 kg)   SpO2 100%   BMI 29.55 kg/m   General: Alert, pleasant woman. NAD. HEENT: NCAT. MMM. CV: RRR, no murmurs.  Resp: CTAB, no wheezing or crackles. Normal WOB on RA.  Abm: Soft, nontender, nondistended. BS present. Ext: Moves all ext spontaneously Skin: Warm, well perfused   ASSESSMENT/PLAN:   Assessment & Plan Essential hypertension Added amlodipine at prior visit, mains uncontrolled today.  Patient reports taking medicine as directed. -Increase amlodipine to 10 mg daily -Continue HCTZ 12.5 daily -Follow-up in 2 to 3 weeks Asthma, unspecified asthma severity, unspecified whether complicated, unspecified whether persistent Controlled after starting Symbicort.  Patient is using Symbicort as needed rather than daily. Encounter for immunization Flu shot given today Chronic pain of right knee Deviously referred to PT, but patient was unable to go.  She is interested in going now.  Provided contact information for PT and advised to schedule.    Annual Examination  See AVS for age appropriate recommendations   Colorectal cancer screening:  Due for colonoscopy, advised to reach out to [facility] to schedule.    Lincoln Brigham, MD [facility]

## 2023-10-14 NOTE — Patient Instructions (Addendum)
Good to see you today - Thank you for coming in  Things we discussed today:  1) For your blood pressure, it was still high so I will increase your amlodipine. - Take amlodipine 10mg  daily - Continue taking your hydrochlorothiazide  2) You are due for a colonoscopy. This is to check for colon cancer.  Please call  GI to schedule your colonoscopy.  [phone]  3) Here is the contact info to schedule with physical therapy.  [facility] [address] [place], [place] [address] [phone]  4) We will check your cholesterol levels today.  Please always bring your medication bottles  Come back to see me in 2-3 weeks to recheck your blood pressure.

## 2023-10-15 ENCOUNTER — Telehealth: Payer: Self-pay | Admitting: Family Medicine

## 2023-10-15 DIAGNOSIS — E785 Hyperlipidemia, unspecified: Secondary | ICD-10-CM | POA: Insufficient documentation

## 2023-10-15 LAB — LIPID PANEL
Chol/HDL Ratio: 4.5 {ratio} — ABNORMAL HIGH (ref 0.0–4.4)
Cholesterol, Total: 269 mg/dL — ABNORMAL HIGH (ref 100–199)
HDL: 60 mg/dL (ref >39)
LDL Chol Calc (NIH): 184 mg/dL — ABNORMAL HIGH (ref 0–99)
Triglycerides: 138 mg/dL (ref 0–149)
VLDL Cholesterol Cal: 25 mg/dL (ref 5–40)

## 2023-10-15 MED ORDER — ATORVASTATIN CALCIUM 40 MG PO TABS: 40.0000 mg | ORAL_TABLET | Freq: Every day | ORAL | 1 refills | Status: DC

## 2023-10-15 NOTE — Telephone Encounter (Signed)
Patient to discuss her recent lipid panel results.  Discussed that her ASCVD risk is 18.4 and that cholesterol and blood pressure control are important for mitigating her cardiovascular disease risk.  Advised patient to start atorvastatin 40 mg once a day and to follow-up in clinic in 1 month.  At that time we will assess her lipid panel again and her blood pressure      The 10-year ASCVD risk score (Arnett DK, et al., 2019) is: 18.4%   Values used to calculate the score:     Age: 57 years     Sex: Female     Is Non-Hispanic African American: Yes     Diabetic: No     Tobacco smoker: No     Systolic Blood Pressure: 176 mmHg     Is BP treated: Yes     HDL Cholesterol: 60 mg/dL     Total Cholesterol: 269 mg/dL

## 2023-10-15 NOTE — Assessment & Plan Note (Addendum)
Deviously referred to PT, but patient was unable to go.  She is interested in going now.  Provided contact information for PT and advised to schedule.

## 2023-10-15 NOTE — Assessment & Plan Note (Signed)
Added amlodipine at prior visit, mains uncontrolled today.  Patient reports taking medicine as directed. -Increase amlodipine to 10 mg daily -Continue HCTZ 12.5 daily -Follow-up in 2 to 3 weeks

## 2023-10-15 NOTE — Assessment & Plan Note (Signed)
Controlled after starting Symbicort.  Patient is using Symbicort as needed rather than daily.

## 2024-01-12 ENCOUNTER — Ambulatory Visit (HOSPITAL_COMMUNITY)
Admission: EM | Admit: 2024-01-12 | Discharge: 2024-01-12 | Disposition: A | Discharge status: Home / Self Care | Attending: Nurse Practitioner | Admitting: Nurse Practitioner

## 2024-01-12 ENCOUNTER — Encounter (HOSPITAL_COMMUNITY): Payer: Self-pay

## 2024-01-12 DIAGNOSIS — J069 Acute upper respiratory infection, unspecified: Secondary | ICD-10-CM | POA: Diagnosis not present

## 2024-01-12 DIAGNOSIS — R062 Wheezing: Secondary | ICD-10-CM

## 2024-01-12 DIAGNOSIS — J4521 Mild intermittent asthma with (acute) exacerbation: Secondary | ICD-10-CM | POA: Diagnosis not present

## 2024-01-12 DIAGNOSIS — R030 Elevated blood-pressure reading, without diagnosis of hypertension: Secondary | ICD-10-CM

## 2024-01-12 MED ORDER — IPRATROPIUM-ALBUTEROL 0.5-2.5 (3) MG/3ML IN SOLN
RESPIRATORY_TRACT | Status: AC
  Filled 2024-01-12: qty 3

## 2024-01-12 MED ORDER — METHYLPREDNISOLONE SODIUM SUCC 125 MG IJ SOLR
125.0000 mg | Freq: Once | INTRAMUSCULAR | Status: AC
  Administered 2024-01-12: 125 mg via INTRAMUSCULAR

## 2024-01-12 MED ORDER — ALBUTEROL SULFATE HFA 108 (90 BASE) MCG/ACT IN AERS: 2.0000 | INHALATION_SPRAY | RESPIRATORY_TRACT | 0 refills | Status: AC | PRN

## 2024-01-12 MED ORDER — IPRATROPIUM-ALBUTEROL 0.5-2.5 (3) MG/3ML IN SOLN
3.0000 mL | Freq: Once | RESPIRATORY_TRACT | Status: AC
  Administered 2024-01-12: 3 mL via RESPIRATORY_TRACT

## 2024-01-12 MED ORDER — PREDNISONE 50 MG PO TABS: 50.0000 mg | ORAL_TABLET | Freq: Every day | ORAL | 0 refills | Status: AC

## 2024-01-12 MED ORDER — METHYLPREDNISOLONE SODIUM SUCC 125 MG IJ SOLR
INTRAMUSCULAR | Status: AC
  Filled 2024-01-12: qty 2

## 2024-01-12 MED ORDER — ALBUTEROL SULFATE (2.5 MG/3ML) 0.083% IN NEBU
2.5000 mg | INHALATION_SOLUTION | Freq: Once | RESPIRATORY_TRACT | Status: AC
  Administered 2024-01-12: 2.5 mg via RESPIRATORY_TRACT

## 2024-01-12 NOTE — ED Provider Notes (Signed)
 [facility]    CSN: [id number] Arrival date & time: 01/12/24  1820      History   Chief Complaint Chief Complaint  Patient presents with   Cough    HPI Sara Day is a 58 y.o. female.   Patient is here requesting evaluation for the new onset of a cough, wheezing, and shortness of breath that started yesterday.  Patient states she has a history of asthma and does not normally require maintenance medication.  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  The wheezing and shortness of breath has  been progressively worsening throughout the day.  She is also feeling very fatigued.  No reported fever, headache, runny nose, abdominal pain, vomiting, or diarrhea.  She did use some NyQuil over-the-counter to help manage the symptoms.  Additionally, she had an old outdated albuterol inhaler that she used without any improvement.  The history is provided by the patient.  Cough Associated symptoms: shortness of breath   Associated symptoms: no chest pain, no fever and no rhinorrhea     Past Medical History:  Diagnosis Date   Asthma    Breast mass    Hypertension     Patient Active Problem List   Diagnosis Date Noted   Hyperlipidemia 10/15/2023   Knee pain, right 05/05/2023   Headache 08/11/2021   Menopause 12/29/2019   Asthma 04/27/2014   HYPERCHOLESTEROLEMIA 05/21/2009   Essential hypertension 05/21/2009   GERD 05/07/2008    Past Surgical History:  Procedure Laterality Date   BREAST EXCISIONAL BIOPSY      OB History   No obstetric history on file.      Home Medications    Prior to Admission medications   Medication Sig Start Date End Date Taking? Authorizing Provider  albuterol (VENTOLIN HFA) 108 (90 Base) MCG/ACT inhaler Inhale 2 puffs into the lungs every 4 (four) hours as needed for wheezing or shortness of breath. 01/12/24  Yes Burgess Estelle, FNP  amLODipine (NORVASC) 10 MG tablet  Take 1 tablet (10 mg total) by mouth at bedtime. 10/14/23  Yes Lincoln Brigham, MD  atorvastatin (LIPITOR) 40 MG tablet Take 1 tablet (40 mg total) by mouth daily. 10/15/23  Yes Lincoln Brigham, MD  hydrochlorothiazide (HYDRODIURIL) 12.5 MG tablet Take 1 tablet (12.5 mg total) by mouth daily. 10/14/23  Yes Lincoln Brigham, MD  loratadine (CLARITIN) 10 MG tablet Take 1 tablet (10 mg total) by mouth daily. 10/14/23  Yes Lincoln Brigham, MD  predniSONE (DELTASONE) 50 MG tablet Take 1 tablet (50 mg total) by mouth daily with breakfast for 7 days. 01/12/24 01/19/24 Yes Burgess Estelle, FNP  albuterol (VENTOLIN HFA) 108 (90 Base) MCG/ACT inhaler Inhale 2 puffs into the lungs every 4 (four) hours as needed for wheezing or shortness of breath. 04/01/23   Lincoln Brigham, MD  budesonide-formoterol [facility]) 80-4.5 MCG/ACT inhaler Inhale 2 puffs into the lungs 2 (two) times daily. 05/05/23   Lincoln Brigham, MD  cyclobenzaprine (FLEXERIL) 10 MG tablet Take 1 tablet (10 mg total) by mouth at bedtime. 04/06/22   Valinda Hoar, NP    Family History Family History  Problem Relation Age of Onset   Heart attack Father    Colon cancer Neg Hx    Colon polyps Neg Hx    Esophageal cancer Neg Hx    Rectal cancer Neg Hx    Stomach cancer Neg Hx     Social History Social  History   Tobacco Use   Smoking status: Never   Smokeless tobacco: Never  Vaping Use   Vaping status: Never Used  Substance Use Topics   Alcohol use: Yes    Alcohol/week: 1.0 standard drink of alcohol    Types: 1 Glasses of wine per week   Drug use: No     Allergies   Clindamycin/lincomycin, Codeine, Metronidazole, Sulfa antibiotics, and Sulfamethoxazole   Review of Systems Review of Systems  Constitutional:  Positive for fatigue. Negative for appetite change and fever.  HENT:  Negative for congestion and rhinorrhea.   Respiratory:  Positive for cough, chest tightness and shortness of breath.   Cardiovascular:  Negative for chest pain.   Gastrointestinal:  Negative for abdominal pain, diarrhea, nausea and vomiting.  Musculoskeletal: Negative.   Neurological: Negative.   Psychiatric/Behavioral: Negative.       Physical Exam Triage Vital Signs ED Triage Vitals  Encounter Vitals Group     BP 01/12/24 1853 (!) 177/108     Systolic BP Percentile --      Diastolic BP Percentile --      Pulse Rate 01/12/24 1853 89     Resp 01/12/24 1853 18     Temp 01/12/24 1853 99 F (37.2 C)     Temp Source 01/12/24 1853 Oral     SpO2 01/12/24 1858 92 %     Weight --      Height --      Head Circumference --      Peak Flow --      Pain Score --      Pain Loc --      Pain Education --      Exclude from Growth Chart --    No data found.  Updated Vital Signs BP (!) 177/108 (BP Location: Left Arm)   Pulse 89   Temp 99 F (37.2 C) (Oral)   Resp 18   SpO2 92%   Visual Acuity Right Eye Distance:   Left Eye Distance:   Bilateral Distance:    Right Eye Near:   Left Eye Near:    Bilateral Near:     Physical Exam Constitutional:      Appearance: She is ill-appearing.  HENT:     Head: Normocephalic.  Cardiovascular:     Rate and Rhythm: Normal rate and regular rhythm.  Pulmonary:     Effort: Pulmonary effort is normal. No respiratory distress.     Breath sounds: Wheezing present. No rhonchi or rales.  Abdominal:     General: Bowel sounds are normal.  Skin:    General: Skin is warm and dry.  Neurological:     General: No focal deficit present.     Mental Status: She is alert and oriented to person, place, and time.  Psychiatric:        Mood and Affect: Mood normal.        Behavior: Behavior normal.        Thought Content: Thought content normal.        Judgment: Judgment normal.      UC Treatments / Results  Labs (all labs ordered are listed, but only abnormal results are displayed) Labs Reviewed - No data to display  EKG   Radiology No results found.  Procedures Procedures (including critical  care time)  Medications Ordered in UC Medications  ipratropium-albuterol (DUONEB) 0.5-2.5 (3) MG/3ML nebulizer solution 3 mL (3 mLs Nebulization Given 01/12/24 1910)  methylPREDNISolone sodium succinate (SOLU-MEDROL) 125 mg/2 mL  injection 125 mg (125 mg Intramuscular Given 01/12/24 1911)  albuterol (PROVENTIL) (2.5 MG/3ML) 0.083% nebulizer solution 2.5 mg (2.5 mg Nebulization Given 01/12/24 1926)    Initial Impression / Assessment and Plan / UC Course  I have reviewed the triage vital signs and the nursing notes.  Pertinent labs & imaging results that were available during my care of the patient were reviewed by me and considered in my medical decision making (see chart for details).     Patient presents for evaluation of a cough, shortness of breath, wheezing is progressively worsened since yesterday.  Endorses a history of asthma.  She used an old albuterol inhaler which did not help the symptoms.  She presents with a pulse oximetry of 90 to 92% on room air.  She is hypertensive as well.  She was administered a DuoNeb and Solu-Medrol 125 mg IM for her symptoms.  Upon reassessment-her air exchange had improved, wheezing had decreased.  However, her pulse oximetry stayed between 92 to 94%.  Repeat blood pressure was 167/94, heart rate in the 80s.  She was given another albuterol nebulizer treatment.  Pulse oximetry increased to 98%, heart rate remained in the 80s, and patient had visibly improved and verbalized feeling much better.  I recommended she utilize her albuterol inhaler every 4 hours for the next 3 days while she is awake.  Ensure adequate rest and oral hydration.  I reviewed blood pressure monitoring and return evaluation indications.  No acute indication for imaging or antibiotic therapy at this time.  Final Clinical Impressions(s) / UC Diagnoses   Final diagnoses:  Acute upper respiratory infection  Mild intermittent asthma with exacerbation  Wheezing  Elevated blood pressure  reading     Discharge Instructions      I suspect your viral upper respiratory infection is causing an exacerbation of your asthma.  You were given several breathing treatments and an injection of steroids to help decrease the wheezing and help you breathe better.  I have sent an updated albuterol inhaler and a prescription for steroids (start tomorrow) to the pharmacy on file.  Your blood pressure was also elevated at today's visit.  Please recheck this at home and follow-up with your primary care provider if it remains greater than 140/90.  Try to avoid OTC decongestants as these can elevate your blood pressure.  Would recommend use of Coricidin HBP Cough and Cold which can be safely taken without elevating her blood pressure.  Ensure adequate rest and oral hydration.     ED Prescriptions     Medication Sig Dispense Auth. Provider   predniSONE (DELTASONE) 50 MG tablet Take 1 tablet (50 mg total) by mouth daily with breakfast for 7 days. 7 tablet Burgess Estelle, FNP   albuterol (VENTOLIN HFA) 108 (90 Base) MCG/ACT inhaler Inhale 2 puffs into the lungs every 4 (four) hours as needed for wheezing or shortness of breath. 18 g Burgess Estelle, FNP      PDMP not reviewed this encounter.   Burgess Estelle, FNP 01/12/24 [phone]

## 2024-01-12 NOTE — Discharge Instructions (Addendum)
 I suspect your viral upper respiratory infection is causing an exacerbation of your asthma.  You were given several breathing treatments and an injection of steroids to help decrease the wheezing and help you breathe better.  I have sent an updated albuterol inhaler and a prescription for steroids (start tomorrow) to the pharmacy on file.  Your blood pressure was also elevated at today's visit.  Please recheck this at home and follow-up with your primary care provider if it remains greater than 140/90.  Try to avoid OTC decongestants as these can elevate your blood pressure.  Would recommend use of Coricidin HBP Cough and Cold which can be safely taken without elevating her blood pressure.  Ensure adequate rest and oral hydration.

## 2024-01-12 NOTE — ED Triage Notes (Signed)
 Cough and wheezing started today. Patient is out of her inhalers.

## 2024-01-20 DIAGNOSIS — Z1231 Encounter for screening mammogram for malignant neoplasm of breast: Secondary | ICD-10-CM | POA: Diagnosis not present

## 2024-01-20 LAB — HM MAMMOGRAPHY

## 2024-02-05 ENCOUNTER — Other Ambulatory Visit: Payer: Self-pay | Admitting: Family Medicine

## 2024-03-10 ENCOUNTER — Other Ambulatory Visit: Payer: Self-pay

## 2024-03-10 DIAGNOSIS — I10 Essential (primary) hypertension: Secondary | ICD-10-CM

## 2024-03-10 NOTE — Telephone Encounter (Signed)
 Patient calls nurse line reporting 2 syncope episodes yesterday.   She reports she has been out of her blood pressure medication for ~ 3 weeks.   She reports she was on the phone yesterday and started feeling hot and dizzy and "just blacked out and went down." She denies any injury or trauma with the fall.   She reports she ate as normal yesterday. And reports she stays hydrated.  She reports she feels somewhat better today, however is still dizzy.   She denies any headaches, vision changes shortness of breath or chest pains.   Patient advised to go to UC as we have no more apt left before the holiday weekend. She agreed with plan.   Will forward to PCP to refill Hydrochlorothiazide .  She was advised refills are at the pharmacy for Amlodipine .

## 2024-03-14 MED ORDER — HYDROCHLOROTHIAZIDE 12.5 MG PO TABS: 12.5000 mg | ORAL_TABLET | Freq: Every day | ORAL | 3 refills | Status: DC

## 2024-03-31 ENCOUNTER — Ambulatory Visit: Admitting: Family Medicine

## 2024-03-31 ENCOUNTER — Telehealth: Payer: Self-pay

## 2024-03-31 ENCOUNTER — Encounter: Payer: Self-pay | Admitting: Family Medicine

## 2024-03-31 VITALS — BP 124/80 | HR 71 | Wt 157.0 lb

## 2024-03-31 DIAGNOSIS — J301 Allergic rhinitis due to pollen: Secondary | ICD-10-CM

## 2024-03-31 DIAGNOSIS — J45909 Unspecified asthma, uncomplicated: Secondary | ICD-10-CM

## 2024-03-31 DIAGNOSIS — I10 Essential (primary) hypertension: Secondary | ICD-10-CM | POA: Diagnosis not present

## 2024-03-31 DIAGNOSIS — E785 Hyperlipidemia, unspecified: Secondary | ICD-10-CM

## 2024-03-31 MED ORDER — LORATADINE 10 MG PO TABS: 10.0000 mg | ORAL_TABLET | Freq: Every day | ORAL | 3 refills | Status: DC

## 2024-03-31 MED ORDER — TRIAMCINOLONE ACETONIDE 0.1 % EX CREA: TOPICAL_CREAM | Freq: Two times a day (BID) | CUTANEOUS | 3 refills | Status: AC

## 2024-03-31 MED ORDER — ATORVASTATIN CALCIUM 40 MG PO TABS: 40.0000 mg | ORAL_TABLET | Freq: Every day | ORAL | 3 refills | Status: DC

## 2024-03-31 NOTE — Patient Instructions (Addendum)
 Good to see you today - Thank you for coming in  Things we discussed today:  1) Congratulations, your blood pressure looks amazing.  - Continue taking your amlodipine  and hydrochlorothiazide  to keep it controlled  2) Contune taking your atorvastatin . This medication controls your cholesterol and protects you against heart attacks and strokes.   3) Call your GI doctor to see if you need to get an updated colonoscopy. This is to check for colon cancer.   [facility] [phone]  Come back to see me in 6 months. We will recheck your labs at that time.

## 2024-03-31 NOTE — Telephone Encounter (Signed)
 Patient calls nurse line regarding prescription for allergies.   She states that pharmacy advised her that they did not receive prescription for allergy medication.   Advised that loratadine  10 mg was sent to pharmacy this morning, however, they are not able to fill due to rx being OTC.   Patient states that OTC allergy medication does not work for her and she needs something prescription strength.   Will forward to PCP for further advisement.   Elsie Halo, RN

## 2024-03-31 NOTE — Progress Notes (Unsigned)
    SUBJECTIVE:   CHIEF COMPLAINT / HPI:   RH is a 58yo F w/ hx of HTN that pf f/u.   HTN - much better controlled now since last visit. Pt reports taking medications as prescribed.  Allergies - Her allergies have been acting up and has been having nasal congestion - Previously well controlled on claritin  10mg  and wants refill  PERTINENT  PMH / PSH: HTN  OBJECTIVE:   BP 124/80   Pulse 71   Wt 157 lb (71.2 kg)   SpO2 98%   BMI 29.66 kg/m   General: Alert, pleasant woman. NAD. HEENT: NCAT. MMM. CV: RRR, no murmurs.  Resp: CTAB, no wheezing or crackles. Normal WOB on RA.  Abm: Soft, nontender, nondistended. BS present. Ext: Moves all ext spontaneously Skin: Warm, well perfused   ASSESSMENT/PLAN:   Assessment & Plan Primary hypertension Well controlled. - Cont hydrochlorothiazide  and amlodipine  Seasonal allergic rhinitis due to pollen Refill provided for loratadine      F/u in 6 months, check BMP and lipid panel at that time  Albin Huh, MD [facility]

## 2024-04-01 ENCOUNTER — Other Ambulatory Visit: Payer: Self-pay | Admitting: Family Medicine

## 2024-04-01 DIAGNOSIS — J309 Allergic rhinitis, unspecified: Secondary | ICD-10-CM

## 2024-04-01 DIAGNOSIS — J45909 Unspecified asthma, uncomplicated: Secondary | ICD-10-CM

## 2024-04-01 MED ORDER — LORATADINE 10 MG PO TABS: 10.0000 mg | ORAL_TABLET | Freq: Every day | ORAL | 3 refills | Status: AC

## 2024-06-26 ENCOUNTER — Other Ambulatory Visit: Payer: Self-pay | Admitting: Family Medicine

## 2024-06-26 ENCOUNTER — Telehealth: Payer: Self-pay

## 2024-06-26 DIAGNOSIS — Z1211 Encounter for screening for malignant neoplasm of colon: Secondary | ICD-10-CM

## 2024-06-26 NOTE — Telephone Encounter (Signed)
 Patient calls nurse line requesting a GI referral.   She reports she is ready for her colonoscopy.   She reports she never made an apt in 2019 when last referral was placed.   Advised will forward to PCP to place.

## 2024-11-08 ENCOUNTER — Ambulatory Visit: Payer: Self-pay | Admitting: Family Medicine

## 2024-11-14 ENCOUNTER — Ambulatory Visit: Admitting: Family Medicine

## 2024-11-22 ENCOUNTER — Encounter: Payer: Self-pay | Admitting: Family Medicine

## 2024-11-22 ENCOUNTER — Ambulatory Visit: Payer: Self-pay | Admitting: Family Medicine

## 2024-11-22 VITALS — BP 138/82 | HR 59 | Ht 61.0 in | Wt 159.0 lb

## 2024-11-22 DIAGNOSIS — Z1231 Encounter for screening mammogram for malignant neoplasm of breast: Secondary | ICD-10-CM

## 2024-11-22 DIAGNOSIS — E785 Hyperlipidemia, unspecified: Secondary | ICD-10-CM

## 2024-11-22 DIAGNOSIS — Z1211 Encounter for screening for malignant neoplasm of colon: Secondary | ICD-10-CM

## 2024-11-22 DIAGNOSIS — I10 Essential (primary) hypertension: Secondary | ICD-10-CM

## 2024-11-22 DIAGNOSIS — R21 Rash and other nonspecific skin eruption: Secondary | ICD-10-CM

## 2024-11-22 DIAGNOSIS — J45909 Unspecified asthma, uncomplicated: Secondary | ICD-10-CM

## 2024-11-22 LAB — POCT SKIN KOH: Skin KOH, POC: NEGATIVE

## 2024-11-22 MED ORDER — ATORVASTATIN CALCIUM 40 MG PO TABS: 40.0000 mg | ORAL_TABLET | Freq: Every day | ORAL | 3 refills | Status: AC

## 2024-11-22 MED ORDER — BUDESONIDE-FORMOTEROL FUMARATE 80-4.5 MCG/ACT IN AERO: 2.0000 | INHALATION_SPRAY | Freq: Two times a day (BID) | RESPIRATORY_TRACT | 12 refills | Status: AC

## 2024-11-22 MED ORDER — HYDROCHLOROTHIAZIDE 12.5 MG PO TABS: 12.5000 mg | ORAL_TABLET | Freq: Every day | ORAL | 3 refills | Status: AC

## 2024-11-22 MED ORDER — AMLODIPINE BESYLATE 10 MG PO TABS: 10.0000 mg | ORAL_TABLET | Freq: Every day | ORAL | 3 refills | Status: AC

## 2024-11-22 MED ORDER — TERBINAFINE HCL 1 % EX CREA: 1.0000 | TOPICAL_CREAM | Freq: Two times a day (BID) | CUTANEOUS | 2 refills | Status: AC

## 2024-11-22 NOTE — Patient Instructions (Signed)
 1) The rash is most likely a fungus skin infection, but we will check labs to make sure it isn't other causes such as syphilis.  - Start applying Lamisil  cream 1% on both feet and hands twice a day for the next 2 weeks - Come back to see me in 2 weeks if the rash is not improving by then. - If the rash is worsening or spreading to new areas, please take photos to keep track of them and come back sooner.

## 2024-11-23 LAB — SYPHILIS: RPR W/REFLEX TO RPR TITER AND TREPONEMAL ANTIBODIES, TRADITIONAL SCREENING AND DIAGNOSIS ALGORITHM: RPR Ser Ql: NONREACTIVE

## 2024-11-24 ENCOUNTER — Ambulatory Visit: Payer: Self-pay | Admitting: Family Medicine

## 2024-11-24 NOTE — Telephone Encounter (Signed)
 Patient returns call to nurse line. Verified name and DOB.   Advised of results per note from Dr. Elicia.   Sara JAYSON English, RN

## 2024-11-24 NOTE — Progress Notes (Cosign Needed)
" ° ° °  SUBJECTIVE:   CHIEF COMPLAINT / HPI:   Discussed the use of AI scribe software for clinical note transcription with the patient, who gave verbal consent to proceed.  History of Present Illness Sara Day is a 59 year old female who presents with a two-week history of rashes on her hands and feet.  Cutaneous eruptions - Rashes began approximately two weeks ago, initially on the foot, then spreading to both hands and feet - Lesions are pruritic and sometimes sore, but not painful - No bleeding or oozing from the rashes - Initial presentation as clear areas with peeling skin, later developing into bumps on the hands - Distribution includes both palms, left toe, and right heel  Response to topical treatments - Application of lotion and a prescribed cream from [facility] provided minimal relief  Potential exposures and triggers - No recent use of public showers, pools, or hot tubs - No recent changes in sexual partners - No contact with individuals with similar symptoms - Recent change in soap, but does not believe it is related - No recent illnesses, tick bites, or animal exposures - Regular hand washing and frequent hot baths or showers    PERTINENT  PMH / PSH: HTN, HLD, asthma  OBJECTIVE:   BP 138/82   Pulse (!) 59   Ht 5' 1 (1.549 m)   Wt 159 lb (72.1 kg)   SpO2 97%   BMI 30.04 kg/m   General: Alert, pleasant woman. NAD. HEENT: NCAT. MMM. CV: RRR, no murmurs.  Resp: CTAB, no wheezing or crackles. Normal WOB on RA.  Abm: Soft, nontender, nondistended. BS present. Ext: Moves all ext spontaneously Skin: Crusted, flaking rash on BL soles and BL palms.        ASSESSMENT/PLAN:   Assessment & Plan Rash Differential includes tinea corporis/pedis, dishydrotic eczema, syphillis. KOH neg, but given pruritus and appearanc and not improving with steroid, will try topical lamisil .  - lamisil  BID topically x2wks - f/u RPR - f/u in 2 wks if not improving Colon cancer  screening GI referral Encounter for screening mammogram for malignant neoplasm of breast Mammogram ordered     Twyla Nearing, MD [facility] "
